# Patient Record
Sex: Male | Born: 1935 | Race: White | Hispanic: No | Marital: Married | Smoking: Former smoker
Health system: Southern US, Community
[De-identification: ages and names within clinical notes are randomized; demographics above are authoritative.]

## PROBLEM LIST (undated history)

## (undated) DIAGNOSIS — I10 Essential (primary) hypertension: Secondary | ICD-10-CM

## (undated) DIAGNOSIS — Z9889 Other specified postprocedural states: Secondary | ICD-10-CM

## (undated) DIAGNOSIS — R112 Nausea with vomiting, unspecified: Secondary | ICD-10-CM

## (undated) DIAGNOSIS — R413 Other amnesia: Secondary | ICD-10-CM

## (undated) DIAGNOSIS — E538 Deficiency of other specified B group vitamins: Secondary | ICD-10-CM

## (undated) DIAGNOSIS — G7000 Myasthenia gravis without (acute) exacerbation: Secondary | ICD-10-CM

## (undated) DIAGNOSIS — C4490 Unspecified malignant neoplasm of skin, unspecified: Secondary | ICD-10-CM

## (undated) DIAGNOSIS — I2699 Other pulmonary embolism without acute cor pulmonale: Secondary | ICD-10-CM

## (undated) DIAGNOSIS — J449 Chronic obstructive pulmonary disease, unspecified: Secondary | ICD-10-CM

## (undated) DIAGNOSIS — G250 Essential tremor: Secondary | ICD-10-CM

## (undated) HISTORY — DX: Essential (primary) hypertension: I10

## (undated) HISTORY — PX: SKIN CANCER EXCISION: SHX779

## (undated) HISTORY — DX: Unspecified malignant neoplasm of skin, unspecified: C44.90

## (undated) HISTORY — DX: Essential tremor: G25.0

## (undated) HISTORY — DX: Myasthenia gravis without (acute) exacerbation: G70.00

## (undated) HISTORY — DX: Other pulmonary embolism without acute cor pulmonale: I26.99

## (undated) HISTORY — PX: CATARACT EXTRACTION: SUR2

## (undated) HISTORY — DX: Deficiency of other specified B group vitamins: E53.8

## (undated) HISTORY — PX: COLONOSCOPY: SHX174

---

## 1964-05-17 HISTORY — PX: FOOT SURGERY: SHX648

## 2002-03-23 ENCOUNTER — Inpatient Hospital Stay (HOSPITAL_COMMUNITY): Admission: EM | Admit: 2002-03-23 | Discharge: 2002-04-04 | Payer: Self-pay | Admitting: Cardiology

## 2002-03-27 ENCOUNTER — Encounter: Payer: Self-pay | Admitting: Internal Medicine

## 2003-12-27 ENCOUNTER — Inpatient Hospital Stay (HOSPITAL_COMMUNITY): Admission: AD | Admit: 2003-12-27 | Discharge: 2004-01-07 | Payer: Self-pay | Admitting: Neurology

## 2004-01-27 ENCOUNTER — Inpatient Hospital Stay (HOSPITAL_COMMUNITY): Admission: AD | Admit: 2004-01-27 | Discharge: 2004-01-29 | Payer: Self-pay | Admitting: Neurology

## 2010-05-17 HISTORY — PX: CHOLECYSTECTOMY: SHX55

## 2011-05-31 DIAGNOSIS — J45901 Unspecified asthma with (acute) exacerbation: Secondary | ICD-10-CM | POA: Diagnosis not present

## 2011-05-31 DIAGNOSIS — R05 Cough: Secondary | ICD-10-CM | POA: Diagnosis not present

## 2011-06-14 DIAGNOSIS — Z85828 Personal history of other malignant neoplasm of skin: Secondary | ICD-10-CM | POA: Diagnosis not present

## 2011-06-14 DIAGNOSIS — C44320 Squamous cell carcinoma of skin of unspecified parts of face: Secondary | ICD-10-CM | POA: Diagnosis not present

## 2011-06-14 DIAGNOSIS — D485 Neoplasm of uncertain behavior of skin: Secondary | ICD-10-CM | POA: Diagnosis not present

## 2011-06-14 DIAGNOSIS — D0439 Carcinoma in situ of skin of other parts of face: Secondary | ICD-10-CM | POA: Diagnosis not present

## 2011-06-14 DIAGNOSIS — L570 Actinic keratosis: Secondary | ICD-10-CM | POA: Diagnosis not present

## 2011-06-14 DIAGNOSIS — R238 Other skin changes: Secondary | ICD-10-CM | POA: Diagnosis not present

## 2011-06-14 DIAGNOSIS — C44121 Squamous cell carcinoma of skin of unspecified eyelid, including canthus: Secondary | ICD-10-CM | POA: Diagnosis not present

## 2011-07-27 DIAGNOSIS — D0439 Carcinoma in situ of skin of other parts of face: Secondary | ICD-10-CM | POA: Diagnosis not present

## 2011-07-27 DIAGNOSIS — C44320 Squamous cell carcinoma of skin of unspecified parts of face: Secondary | ICD-10-CM | POA: Diagnosis not present

## 2011-07-27 DIAGNOSIS — C44121 Squamous cell carcinoma of skin of unspecified eyelid, including canthus: Secondary | ICD-10-CM | POA: Diagnosis not present

## 2011-08-04 DIAGNOSIS — K219 Gastro-esophageal reflux disease without esophagitis: Secondary | ICD-10-CM | POA: Diagnosis not present

## 2011-08-04 DIAGNOSIS — I10 Essential (primary) hypertension: Secondary | ICD-10-CM | POA: Diagnosis not present

## 2011-09-11 DIAGNOSIS — T1510XA Foreign body in conjunctival sac, unspecified eye, initial encounter: Secondary | ICD-10-CM | POA: Diagnosis not present

## 2011-09-11 DIAGNOSIS — H2510 Age-related nuclear cataract, unspecified eye: Secondary | ICD-10-CM | POA: Diagnosis not present

## 2011-09-11 DIAGNOSIS — S058X9A Other injuries of unspecified eye and orbit, initial encounter: Secondary | ICD-10-CM | POA: Diagnosis not present

## 2011-09-29 DIAGNOSIS — G7001 Myasthenia gravis with (acute) exacerbation: Secondary | ICD-10-CM | POA: Diagnosis not present

## 2011-09-29 DIAGNOSIS — G5600 Carpal tunnel syndrome, unspecified upper limb: Secondary | ICD-10-CM | POA: Diagnosis not present

## 2012-01-05 DIAGNOSIS — L821 Other seborrheic keratosis: Secondary | ICD-10-CM | POA: Diagnosis not present

## 2012-01-05 DIAGNOSIS — L570 Actinic keratosis: Secondary | ICD-10-CM | POA: Diagnosis not present

## 2012-01-05 DIAGNOSIS — Z85828 Personal history of other malignant neoplasm of skin: Secondary | ICD-10-CM | POA: Diagnosis not present

## 2012-01-20 DIAGNOSIS — K219 Gastro-esophageal reflux disease without esophagitis: Secondary | ICD-10-CM | POA: Diagnosis not present

## 2012-01-20 DIAGNOSIS — R5381 Other malaise: Secondary | ICD-10-CM | POA: Diagnosis not present

## 2012-01-20 DIAGNOSIS — I10 Essential (primary) hypertension: Secondary | ICD-10-CM | POA: Diagnosis not present

## 2012-01-20 DIAGNOSIS — E538 Deficiency of other specified B group vitamins: Secondary | ICD-10-CM | POA: Diagnosis not present

## 2012-01-27 DIAGNOSIS — I10 Essential (primary) hypertension: Secondary | ICD-10-CM | POA: Diagnosis not present

## 2012-01-27 DIAGNOSIS — K219 Gastro-esophageal reflux disease without esophagitis: Secondary | ICD-10-CM | POA: Diagnosis not present

## 2012-01-27 DIAGNOSIS — J438 Other emphysema: Secondary | ICD-10-CM | POA: Diagnosis not present

## 2012-01-27 DIAGNOSIS — G7000 Myasthenia gravis without (acute) exacerbation: Secondary | ICD-10-CM | POA: Diagnosis not present

## 2012-01-27 DIAGNOSIS — Z86711 Personal history of pulmonary embolism: Secondary | ICD-10-CM | POA: Diagnosis not present

## 2012-03-09 DIAGNOSIS — E538 Deficiency of other specified B group vitamins: Secondary | ICD-10-CM | POA: Diagnosis not present

## 2012-03-09 DIAGNOSIS — Z23 Encounter for immunization: Secondary | ICD-10-CM | POA: Diagnosis not present

## 2012-03-16 DIAGNOSIS — G7001 Myasthenia gravis with (acute) exacerbation: Secondary | ICD-10-CM | POA: Diagnosis not present

## 2012-07-07 ENCOUNTER — Encounter: Payer: Self-pay | Admitting: Neurology

## 2012-07-07 DIAGNOSIS — D518 Other vitamin B12 deficiency anemias: Secondary | ICD-10-CM

## 2012-07-07 DIAGNOSIS — G459 Transient cerebral ischemic attack, unspecified: Secondary | ICD-10-CM

## 2012-07-07 DIAGNOSIS — G5600 Carpal tunnel syndrome, unspecified upper limb: Secondary | ICD-10-CM

## 2012-07-07 DIAGNOSIS — G7001 Myasthenia gravis with (acute) exacerbation: Secondary | ICD-10-CM | POA: Insufficient documentation

## 2012-07-07 DIAGNOSIS — H538 Other visual disturbances: Secondary | ICD-10-CM

## 2012-07-07 DIAGNOSIS — G453 Amaurosis fugax: Secondary | ICD-10-CM

## 2012-07-07 DIAGNOSIS — G250 Essential tremor: Secondary | ICD-10-CM

## 2012-07-07 DIAGNOSIS — Z85828 Personal history of other malignant neoplasm of skin: Secondary | ICD-10-CM

## 2012-07-07 DIAGNOSIS — Z8679 Personal history of other diseases of the circulatory system: Secondary | ICD-10-CM

## 2012-07-07 DIAGNOSIS — G5602 Carpal tunnel syndrome, left upper limb: Secondary | ICD-10-CM | POA: Insufficient documentation

## 2012-07-07 DIAGNOSIS — G7000 Myasthenia gravis without (acute) exacerbation: Secondary | ICD-10-CM

## 2012-07-10 DIAGNOSIS — C44319 Basal cell carcinoma of skin of other parts of face: Secondary | ICD-10-CM | POA: Diagnosis not present

## 2012-07-10 DIAGNOSIS — L570 Actinic keratosis: Secondary | ICD-10-CM | POA: Diagnosis not present

## 2012-07-10 DIAGNOSIS — Z85828 Personal history of other malignant neoplasm of skin: Secondary | ICD-10-CM | POA: Diagnosis not present

## 2012-08-21 DIAGNOSIS — G5600 Carpal tunnel syndrome, unspecified upper limb: Secondary | ICD-10-CM | POA: Diagnosis not present

## 2012-08-25 DIAGNOSIS — M79609 Pain in unspecified limb: Secondary | ICD-10-CM | POA: Diagnosis not present

## 2012-08-25 DIAGNOSIS — I82409 Acute embolism and thrombosis of unspecified deep veins of unspecified lower extremity: Secondary | ICD-10-CM | POA: Diagnosis not present

## 2012-08-28 ENCOUNTER — Encounter: Payer: Self-pay | Admitting: Neurology

## 2012-08-28 ENCOUNTER — Ambulatory Visit (INDEPENDENT_AMBULATORY_CARE_PROVIDER_SITE_OTHER): Payer: Medicare Other | Admitting: Neurology

## 2012-08-28 VITALS — BP 133/74 | HR 61 | Ht 69.0 in | Wt 222.0 lb

## 2012-08-28 DIAGNOSIS — H5460 Unqualified visual loss, one eye, unspecified: Secondary | ICD-10-CM | POA: Diagnosis not present

## 2012-08-28 DIAGNOSIS — E538 Deficiency of other specified B group vitamins: Secondary | ICD-10-CM

## 2012-08-28 DIAGNOSIS — I2699 Other pulmonary embolism without acute cor pulmonale: Secondary | ICD-10-CM | POA: Diagnosis not present

## 2012-08-28 DIAGNOSIS — G7000 Myasthenia gravis without (acute) exacerbation: Secondary | ICD-10-CM

## 2012-08-28 DIAGNOSIS — I10 Essential (primary) hypertension: Secondary | ICD-10-CM

## 2012-08-28 DIAGNOSIS — C4490 Unspecified malignant neoplasm of skin, unspecified: Secondary | ICD-10-CM

## 2012-08-28 DIAGNOSIS — H5461 Unqualified visual loss, right eye, normal vision left eye: Secondary | ICD-10-CM

## 2012-08-28 DIAGNOSIS — G250 Essential tremor: Secondary | ICD-10-CM

## 2012-08-28 MED ORDER — PYRIDOSTIGMINE BROMIDE 60 MG PO TABS: 60.0000 mg | ORAL_TABLET | Freq: Four times a day (QID) | ORAL | Status: DC

## 2012-08-28 NOTE — Progress Notes (Signed)
HPI: 79 year old right-handed white married male with generalized seropositive myasthenia gravis characterized by bulbar symptomatology, shortness of breath , and dysphagia.   He was diagnosed 10/2002 with a  positive Tensilon test, positive acetylchoine receptor antibody,and had a positive ANA at 1-160 speckled pattern. Initially he had right eye ptosis and subsequently, double vision, fatigue with chewing, dysphagia, and upper and lower extremity weakness. He was initially treated with Mestinon bromide in June 2004 which was associated with GI side effects.  Prednisone was begun 03/29/2003 and CellCept 05/2003. He developed increasing jitteriness, rash, and cramps on CellCept which was discontinued 10/2003. He had progressive bulbar weakness and was admitted 12/2003 for IV IgG therapy which required a PEG tube for dysphagia transiently. He was started on Imuran. 01/17/2004 he was readmitted for 2 days of IV IgG because of shortness of breath. He has not been hospitalized since 01/2004.   He has been slowly tapered off prednisone 09/2007 and was on Imuran and Mestinon bromide. He had B12 deficiency and was receiving B12 shots ,but  B12 levels off injections have been normal and B12 shots were discontinued.  He has  a history of vague visual loss on the left of unknown etiology. He denies swallowing problems, speech problems, voice changes, focal weakness, or double vision.  He is independent in activities of daily living and takes one nap per day.He denies memory loss but his wife states that he does have memory loss. His last blood studies in my office 05/27/10 were normal except a low white blood cell count of 3900.  Dr. Herma Mering was concerned that the Imuran is associated with increasing risk of skin cancer and the imuran was discontinued 12/30/2010.   He has noted no change In myasthenic symptoms. His skin has improved. His medication for myasthenia is mesinon bromide 60  milligrams one and one half  tablets 4 times a day. He has shortness of breath since his pulmonary emboli in 2003, also complains of excessive fatigue.   He is allergic to aspirin, GI side effect, gastric ulcer,  and is on Plavix for a history of TIAs with right arm weakness He has  swallowing problems at the beginning of the meal with liquids that improves as the meal continues.     UPDATE April   He is going to have right CTS release in May 2014 by Dr. Teressa Senter.  Complains of shortness of breath with walking, bending over to tire his shoed, due to PE 2003, pulmnolgist folllowed up, he does not like to chew on tough beef, he denies swallowing difficulty, no double vision, no droopy eyelid, no significant bleeding muscle weakness. He is active in yard, shop, threadmill   Review of Systems  Out of a complete 14 system review, the patient complains of only the following symptoms, and all other reviewed systems are negative.   Constitutional:   N/A Cardiovascular:  Swelling in legs Ear/Nose/Throat:  N/A Skin: N/A Eyes: N/A Respiratory: Shortness of breath stroitestinal: N/A    Hematology/Lymphatic:  N/A Endocrine:  N/A Musculoskeletal:N/A Allergy/Immunology: N/A Neurological:  memory loss , confusion  Psychiatric:    N/A    Physical Exam  General: Well developed white male.    Neurologic Exam  Mental Status: Alert and oriented to time, place, and person.  Follows one, two and three step commands.  No aphasia, apraxia or agnosia Cranial Nerves: Visual fields are full to count fingers examination.  Discs flat.  Extraocular movements full.Corneals are present.  Hearing decreased, worse in  the right ear. No facial asymmetry.  Tongue midline, uvula midline, and gags present.  Sternocleidomastoid and trapezius testing normal. No significant pulses Motor: 5/5 strength proximally and distally in the upper and lower extremities.  No evidence of proximal, pronator, or distal drift.  No focal atrophy and no fasciculations  seen. Sensory: Intact to pinprick, light touch, joint position, and vibration testing.  Coordination:  mild outstretched hand and arm tremor.  Intact finger-to-nose, heel-to-shin, and rapid alternating movements.   Gait and Station: Can toe walk, heel walk, and tandem gait.  Romberg testing is negative. Reflexes: Postural and righting reflexes are normal.  DTRs 2+ and equal.  Plantar responses downgoing.    assessment and plan  Systemic seropositive myasthenia gravis, doing very well, immunosuppressive treatment now, continued cutting back his Mestinon, 60 mg 3 times a day, then half tablets 3 times a day,  return to clinic in 2 months, no Mestinon on the day of followup appointment

## 2012-08-28 NOTE — Patient Instructions (Signed)
Take mestinon 60mg  four times a day x 2 weeks,  Then mestinon 60mg  3 times  A day.  Came back to clinic in 3 months, the day of appointment, do not take any mestinon.

## 2012-09-05 ENCOUNTER — Other Ambulatory Visit: Payer: Self-pay | Admitting: Orthopedic Surgery

## 2012-09-11 ENCOUNTER — Encounter (HOSPITAL_BASED_OUTPATIENT_CLINIC_OR_DEPARTMENT_OTHER): Payer: Self-pay | Admitting: *Deleted

## 2012-09-11 ENCOUNTER — Other Ambulatory Visit: Payer: Self-pay

## 2012-09-11 ENCOUNTER — Encounter (HOSPITAL_BASED_OUTPATIENT_CLINIC_OR_DEPARTMENT_OTHER)
Admission: RE | Admit: 2012-09-11 | Discharge: 2012-09-11 | Disposition: A | Discharge status: Home / Self Care | Payer: Medicare Other | Source: Ambulatory Visit | Attending: Orthopedic Surgery | Admitting: Orthopedic Surgery

## 2012-09-11 DIAGNOSIS — G7000 Myasthenia gravis without (acute) exacerbation: Secondary | ICD-10-CM | POA: Diagnosis not present

## 2012-09-11 DIAGNOSIS — Z86711 Personal history of pulmonary embolism: Secondary | ICD-10-CM | POA: Diagnosis not present

## 2012-09-11 DIAGNOSIS — I10 Essential (primary) hypertension: Secondary | ICD-10-CM | POA: Diagnosis not present

## 2012-09-11 DIAGNOSIS — G5600 Carpal tunnel syndrome, unspecified upper limb: Secondary | ICD-10-CM | POA: Diagnosis not present

## 2012-09-11 LAB — BASIC METABOLIC PANEL
CO2: 29 mEq/L (ref 19–32)
Calcium: 9.5 mg/dL (ref 8.4–10.5)
Chloride: 100 mEq/L (ref 96–112)
Creatinine, Ser: 1.52 mg/dL — ABNORMAL HIGH (ref 0.50–1.35)
GFR calc non Af Amer: 42 mL/min — ABNORMAL LOW (ref >90)
Sodium: 138 mEq/L (ref 135–145)

## 2012-09-11 NOTE — Progress Notes (Signed)
Talked with wife-she will bring him for bmet-ekg-will have other CT done later- Was not told to stop plavix.

## 2012-09-13 NOTE — H&P (Signed)
Calvin Sloan is an 79 y.o. male.   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  His work involved Psychologist, occupational.  He has had a 12 month history of bilateral hand and wrist pain.  He has had large osteophytes in the dorsal aspect of his wrist, i.e. carpal bosses.  He has had symptoms at night that cause him to wake with marked numbness in the median innervated fingers on the right.  He has no history of diabetes, thyroid disease or gout.  He has two sisters who have had  carpal tunnel syndrome.  He has had no history of wrist fracture.     Past Medical History  Diagnosis Date  . Hypertension   . Pulmonary emboli     in Nov 2003  . B12 deficiency   . Essential tremor   . Skin cancer   . Myasthenia gravis     since 2004  . Memory deficit     Past Surgical History  Procedure Laterality Date  . Foot surgery  1966    lt orif foot  . Skin cancer excision    . Cholecystectomy  2012    [facility]  . Colonoscopy      Family History  Problem Relation Age of Onset  . Heart disease Mother   . Heart disease Father    Social History:  reports that he quit smoking about 44 years ago. He does not have any smokeless tobacco history on file. He reports that he does not drink alcohol or use illicit drugs.  Allergies:  Allergies  Allergen Reactions  . Aspirin   . Shellfish Allergy     No prescriptions prior to admission    No results found for this or any previous visit (from the past 48 hour(s)).  No results found.   Pertinent items are noted in HPI.  Height 5\' 9"  (1.753 m), weight 99.791 kg (220 lb).  General appearance: alert Head: Normocephalic, without obvious abnormality Neck: supple, symmetrical, trachea midline Resp: clear to auscultation bilaterally Cardio: regular rate and rhythm GI: normal findings: bowel sounds normal Extremities:   Inspection of his hands and wrist reveal large dorsal carpal bosses.  He has near full range of motion of his wrists and fingers.  No sign of stenosing tenosynovitis.  He has positive wrist flexion test bilaterally.  He has positive Tinel's sign bilaterally.  I do not detect thenar atrophy.  His intrinsic muscle strength is preserved in the interosseous muscles no acute distress hypothenar muscles.    X-rays of his hands and wrist demonstrate bilateral STT arthrosis, right worse than left.    Dr. Johna Roles completed detailed electrodiagnostic studies.  These reveal very severe bilateral carpal tunnel syndrome right worse than left.   His sensory motor amplitudes are very diminished.    Pulses: 2+ and symmetric Skin: Skin color, texture, turgor normal. No rashes or lesions or normal Neurologic:Normal gait. No lid lag. Good overall strength.    Assessment/Plan Impression: Right CTS  Plan:To the OR for right CTR.The procedure, risks,benefits and post-op course were discussed with the patient at length and they were in agreement with the plan.  DASNOIT,Jullian Clayson J 09/13/2012, 3:39 PM  H&P documentation: 09/14/2012  -History and Physical Reviewed  -Patient has been re-examined  -No change in the plan of care  Wyn Forster, MD

## 2012-09-14 ENCOUNTER — Encounter (HOSPITAL_BASED_OUTPATIENT_CLINIC_OR_DEPARTMENT_OTHER)
Admission: RE | Disposition: A | Discharge status: Home / Self Care | Payer: Self-pay | Source: Ambulatory Visit | Attending: Orthopedic Surgery

## 2012-09-14 ENCOUNTER — Ambulatory Visit (HOSPITAL_BASED_OUTPATIENT_CLINIC_OR_DEPARTMENT_OTHER)
Admission: RE | Admit: 2012-09-14 | Discharge: 2012-09-14 | Disposition: A | Discharge status: Home / Self Care | Payer: Medicare Other | Source: Ambulatory Visit | Attending: Orthopedic Surgery | Admitting: Orthopedic Surgery

## 2012-09-14 ENCOUNTER — Ambulatory Visit (HOSPITAL_BASED_OUTPATIENT_CLINIC_OR_DEPARTMENT_OTHER): Payer: Medicare Other | Admitting: Anesthesiology

## 2012-09-14 ENCOUNTER — Encounter (HOSPITAL_BASED_OUTPATIENT_CLINIC_OR_DEPARTMENT_OTHER): Payer: Self-pay | Admitting: *Deleted

## 2012-09-14 ENCOUNTER — Encounter (HOSPITAL_BASED_OUTPATIENT_CLINIC_OR_DEPARTMENT_OTHER): Payer: Self-pay | Admitting: Anesthesiology

## 2012-09-14 DIAGNOSIS — Z86711 Personal history of pulmonary embolism: Secondary | ICD-10-CM | POA: Insufficient documentation

## 2012-09-14 DIAGNOSIS — I10 Essential (primary) hypertension: Secondary | ICD-10-CM | POA: Insufficient documentation

## 2012-09-14 DIAGNOSIS — G7000 Myasthenia gravis without (acute) exacerbation: Secondary | ICD-10-CM | POA: Diagnosis not present

## 2012-09-14 DIAGNOSIS — G5600 Carpal tunnel syndrome, unspecified upper limb: Secondary | ICD-10-CM | POA: Insufficient documentation

## 2012-09-14 HISTORY — DX: Other amnesia: R41.3

## 2012-09-14 HISTORY — PX: CARPAL TUNNEL RELEASE: SHX101

## 2012-09-14 SURGERY — CARPAL TUNNEL RELEASE
Anesthesia: Monitor Anesthesia Care | Site: Wrist | Laterality: Right | Wound class: Clean

## 2012-09-14 MED ORDER — CHLORHEXIDINE GLUCONATE 4 % EX LIQD: 60.0000 mL | Freq: Once | CUTANEOUS | Status: DC

## 2012-09-14 MED ORDER — LACTATED RINGERS IV SOLN
INTRAVENOUS | Status: DC
  Administered 2012-09-14: 09:00:00 via INTRAVENOUS

## 2012-09-14 MED ORDER — PROPOFOL 10 MG/ML IV EMUL
INTRAVENOUS | Status: DC | PRN
  Administered 2012-09-14: 100 ug/kg/min via INTRAVENOUS

## 2012-09-14 MED ORDER — OXYCODONE-ACETAMINOPHEN 5-325 MG PO TABS: ORAL_TABLET | ORAL | Status: DC

## 2012-09-14 MED ORDER — LIDOCAINE HCL (CARDIAC) 20 MG/ML IV SOLN
INTRAVENOUS | Status: DC | PRN
  Administered 2012-09-14: 50 mg via INTRAVENOUS

## 2012-09-14 MED ORDER — FENTANYL CITRATE 0.05 MG/ML IJ SOLN
INTRAMUSCULAR | Status: DC | PRN
  Administered 2012-09-14: 50 ug via INTRAVENOUS
  Administered 2012-09-14 (×2): 25 ug via INTRAVENOUS

## 2012-09-14 MED ORDER — MIDAZOLAM HCL 2 MG/2ML IJ SOLN: 1.0000 mg | INTRAMUSCULAR | Status: DC | PRN

## 2012-09-14 MED ORDER — FENTANYL CITRATE 0.05 MG/ML IJ SOLN: 50.0000 ug | INTRAMUSCULAR | Status: DC | PRN

## 2012-09-14 MED ORDER — LIDOCAINE HCL 2 % IJ SOLN
INTRAMUSCULAR | Status: DC | PRN
  Administered 2012-09-14: 4.5 mL

## 2012-09-14 SURGICAL SUPPLY — 37 items
BANDAGE ADHESIVE 1X3 (GAUZE/BANDAGES/DRESSINGS) IMPLANT
BANDAGE ELASTIC 3 VELCRO ST LF (GAUZE/BANDAGES/DRESSINGS) ×2 IMPLANT
BLADE SURG 15 STRL LF DISP TIS (BLADE) ×1 IMPLANT
BLADE SURG 15 STRL SS (BLADE) ×1
BNDG ESMARK 4X9 LF (GAUZE/BANDAGES/DRESSINGS) ×2 IMPLANT
BRUSH SCRUB EZ PLAIN DRY (MISCELLANEOUS) ×2 IMPLANT
CLOTH BEACON ORANGE TIMEOUT ST (SAFETY) ×2 IMPLANT
CORDS BIPOLAR (ELECTRODE) ×2 IMPLANT
COVER MAYO STAND STRL (DRAPES) ×2 IMPLANT
COVER TABLE BACK 60X90 (DRAPES) ×2 IMPLANT
CUFF TOURNIQUET SINGLE 18IN (TOURNIQUET CUFF) ×2 IMPLANT
DECANTER SPIKE VIAL GLASS SM (MISCELLANEOUS) IMPLANT
DRAPE EXTREMITY T 121X128X90 (DRAPE) ×2 IMPLANT
DRAPE SURG 17X23 STRL (DRAPES) ×2 IMPLANT
GLOVE BIOGEL M STRL SZ7.5 (GLOVE) ×2 IMPLANT
GLOVE BIOGEL PI IND STRL 6.5 (GLOVE) ×1 IMPLANT
GLOVE BIOGEL PI INDICATOR 6.5 (GLOVE) ×1
GLOVE ECLIPSE 6.5 STRL STRAW (GLOVE) ×2 IMPLANT
GLOVE ORTHO TXT STRL SZ7.5 (GLOVE) ×2 IMPLANT
GOWN BRE IMP PREV XXLGXLNG (GOWN DISPOSABLE) ×2 IMPLANT
GOWN PREVENTION PLUS XLARGE (GOWN DISPOSABLE) ×4 IMPLANT
NEEDLE 27GAX1X1/2 (NEEDLE) ×2 IMPLANT
PACK BASIN DAY SURGERY FS (CUSTOM PROCEDURE TRAY) ×2 IMPLANT
PAD CAST 3X4 CTTN HI CHSV (CAST SUPPLIES) ×1 IMPLANT
PADDING CAST ABS 4INX4YD NS (CAST SUPPLIES) ×1
PADDING CAST ABS COTTON 4X4 ST (CAST SUPPLIES) ×1 IMPLANT
PADDING CAST COTTON 3X4 STRL (CAST SUPPLIES) ×1
SPLINT PLASTER CAST XFAST 3X15 (CAST SUPPLIES) ×5 IMPLANT
SPLINT PLASTER XTRA FASTSET 3X (CAST SUPPLIES) ×5
SPONGE GAUZE 4X4 12PLY (GAUZE/BANDAGES/DRESSINGS) ×2 IMPLANT
STOCKINETTE 4X48 STRL (DRAPES) ×2 IMPLANT
STRIP CLOSURE SKIN 1/2X4 (GAUZE/BANDAGES/DRESSINGS) ×2 IMPLANT
SUT PROLENE 3 0 PS 2 (SUTURE) ×2 IMPLANT
SYR 3ML 23GX1 SAFETY (SYRINGE) IMPLANT
SYR CONTROL 10ML LL (SYRINGE) ×2 IMPLANT
TRAY DSU PREP LF (CUSTOM PROCEDURE TRAY) ×2 IMPLANT
UNDERPAD 30X30 INCONTINENT (UNDERPADS AND DIAPERS) ×2 IMPLANT

## 2012-09-14 NOTE — Brief Op Note (Signed)
09/14/2012  10:12 AM  PATIENT:  Mickie Hillier Glendinning  79 y.o. male  PRE-OPERATIVE DIAGNOSIS:  SEVERE BILATERAL CARPAL TUNNEL SYNDROME  POST-OPERATIVE DIAGNOSIS:  SEVERE BILATERAL CARPAL TUNNEL SYNDROME  PROCEDURE:  Procedure(s): CARPAL TUNNEL RELEASE (Right)  SURGEON:  Surgeon(s) and Role:    * Wyn Forster., MD - Primary  PHYSICIAN ASSISTANT:   ASSISTANTS: nurse  ANESTHESIA:   Monitored anesthesia care and 2% lidocaine field block and median nerve block   EBL:  Total I/O In: 200 [I.V.:200] Out: -   BLOOD ADMINISTERED:none  DRAINS: none   LOCAL MEDICATIONS USED:  XYLOCAINE   SPECIMEN:  No Specimen  DISPOSITION OF SPECIMEN:  N/A  COUNTS:  YES  TOURNIQUET:   Total Tourniquet Time Documented: Upper Arm (Right) - 21 minutes Total: Upper Arm (Right) - 21 minutes   DICTATION: .Other Dictation: Dictation Number [id number]  PLAN OF CARE: Discharge to home after PACU  PATIENT DISPOSITION:  PACU - hemodynamically stable.   Delay start of Pharmacological VTE agent (>24hrs) due to surgical blood loss or risk of bleeding: not applicable

## 2012-09-14 NOTE — Anesthesia Postprocedure Evaluation (Signed)
Anesthesia Post Note  Patient: Calvin Sloan  Procedure(s) Performed: Procedure(s) (LRB): CARPAL TUNNEL RELEASE (Right)  Anesthesia type: general  Patient location: PACU  Post pain: Pain level controlled  Post assessment: Patient's Cardiovascular Status Stable  Last Vitals:  Filed Vitals:   09/14/12 1115  BP: 127/68  Pulse: 55  Temp:   Resp: 11    Post vital signs: Reviewed and stable  Level of consciousness: sedated  Complications: No apparent anesthesia complications

## 2012-09-14 NOTE — Op Note (Signed)
[id number] 

## 2012-09-14 NOTE — Anesthesia Preprocedure Evaluation (Signed)
Anesthesia Evaluation  Patient identified by MRN, date of birth, ID band Patient awake    Reviewed: Allergy & Precautions, H&P , NPO status , Patient's Chart, lab work & pertinent test results  Airway Mallampati: I TM Distance: >3 FB Neck ROM: Full    Dental   Pulmonary          Cardiovascular hypertension, Pt. on medications     Neuro/Psych Pt has Myesthenia gravis on mestinon 3 times daily with symptoms of tiredness at end of dosage period.  Neuromuscular disease    GI/Hepatic   Endo/Other    Renal/GU      Musculoskeletal   Abdominal   Peds  Hematology   Anesthesia Other Findings   Reproductive/Obstetrics                           Anesthesia Physical Anesthesia Plan  ASA: III  Anesthesia Plan: MAC   Post-op Pain Management:    Induction: Intravenous  Airway Management Planned: Simple Face Mask  Additional Equipment:   Intra-op Plan:   Post-operative Plan:   Informed Consent: I have reviewed the patients History and Physical, chart, labs and discussed the procedure including the risks, benefits and alternatives for the proposed anesthesia with the patient or authorized representative who has indicated his/her understanding and acceptance.     Plan Discussed with: CRNA and Surgeon  Anesthesia Plan Comments:         Anesthesia Quick Evaluation

## 2012-09-14 NOTE — Transfer of Care (Signed)
Immediate Anesthesia Transfer of Care Note  Patient: Calvin Sloan  Procedure(s) Performed: Procedure(s) (LRB): CARPAL TUNNEL RELEASE (Right)  Patient Location: PACU  Anesthesia Type: MAC  Level of Consciousness: awake, alert , oriented and patient cooperative  Airway & Oxygen Therapy: Patient Spontanous Breathing and Patient connected to face mask oxygen  Post-op Assessment: Report given to PACU RN and Post -op Vital signs reviewed and stable  Post vital signs: Reviewed and stable  Complications: No apparent anesthesia complications

## 2012-09-15 ENCOUNTER — Encounter (HOSPITAL_BASED_OUTPATIENT_CLINIC_OR_DEPARTMENT_OTHER): Payer: Self-pay | Admitting: Orthopedic Surgery

## 2012-09-15 NOTE — Op Note (Signed)
NAMEELIN, [name]  [id number]  MEDICAL RECORD NO.:  [id number]  LOCATION:                                 FACILITY:  PHYSICIAN:  Calvin Fitch. Chassie Pennix, M.D. DATE OF BIRTH:  1933-07-23  DATE OF PROCEDURE:  09/14/2012 DATE OF DISCHARGE:                              OPERATIVE REPORT   PREOPERATIVE DIAGNOSES:  Entrapment neuropathy median nerve, right carpal tunnel severe with thenar atrophy.  POSTOPERATIVE DIAGNOSES:  Entrapment neuropathy median nerve, right carpal tunnel severe with thenar atrophy.  OPERATION:  Release of right transverse carpal ligament.  OPERATING SURGEON:  Calvin Fitch. Bushra Denman, M.D.  ASSISTANT:  Nurse.  ANESTHESIA:  A 2% lidocaine field block and median nerve block right wrist supplemented by IV sedation.  SUPERVISING ANESTHESIOLOGIST:  Kaylyn Layer. Michelle Piper, M.D.  INDICATIONS:  Calvin Sloan is a 79 year old gentleman referred by Dr. Selinda Flavin of [place], [place] for management of severe bilateral carpal tunnel syndrome.  He has a very complex past medical history, with background myasthenia gravis managed by Dr. Terrace Arabia of [facility].  In addition to his issues with myasthenia gravis, he has a history of acid reflux, high blood pressure controlled by medication and is on Plavix anticoagulation.  He presented for evaluation and management of his hands.  He had electrodiagnostic studies documenting very severe bilateral median neuropathy, and clinical examination confirmed thenar atrophy.  I had detailed informed consent with Calvin Sloan and his wife.  I advised that it is in his best interest to proceed with release of his transverse carpal ligaments in a staged manner.  He understands that it will take 5-6 months to have maximum recovery of his nerve.  Our goal with release of the transverse carpal ligament is relief of his night pain, possible improvement of his numbness, and possible improvement of his thenar  atrophy.  At age 33 with this severe neuropathy, we cannot guarantee full recovery.  He and his wife understand these limitations.  Questions were invited and answered in detail.  Preoperatively, we consulted with Dr. Michelle Piper of Anesthesia.  Due to his myasthenia gravis, we felt it was in our best interest to provide local anesthesia and sedation.  Calvin Sloan was counseled by Dr. Michelle Piper in the preoperative holding area and agreed to proceed with IV sedation and local anesthesia for his surgical procedure.  After informed consent, he is brought to room 1 of the [facility] at this time.  Preoperatively, his right hand and wrist were marked for the proper surgical site identification protocol.  Questions were invited and answered in detail.  PROCEDURE:  Calvin Sloan was brought to room 1 of the [facility] and placed in supine position on the operating table.  Following IV sedation under Dr. Deirdre Priest direct supervision, the right arm and hand were prepped with Betadine soap and solution, sterilely draped.  A pneumatic tourniquet was applied to the proximal right brachium.  Following routine surgical time-out, the right hand and arm were exsanguinated with Esmarch bandage and an arterial tourniquet on the proximal brachium inflated to 250 mmHg due to mild systolic hypertension.  A 2% lidocaine was infiltrated in the path  of the intended incision.  The distal forearm volar skin and around the median nerve to obtain a median nerve block.  After 5 minutes, excellent anesthesia was achieved.  Procedure commenced with a short incision in the line of the ring finger in the palm.  Subcutaneous tissues were carefully divided along the palmar fascia.  This was markedly thickened.  The palmar fascia was released with scissors and scalpel down to the level of the mid palmar space.  The superficial palmar arch and the common sensory branch of the median nerve were identified.   The carpal canal was sounded with a Penfield 4 elevator, followed by sequential proximal release of the transverse carpal ligament and volar forearm fascia, fully decompressing the median nerve.  The nerve was displaced about 1 cm ulnar to its usual position due to hypertrophy of the ulnar bursa.  With great care, the volar forearm fascia was released 6 cm above the distal wrist flexion crease.  Bleeding points along the margins of the skin, fascia, and ligament were electrocauterized with bipolar current, followed by repair of the skin with intradermal 3-0 Prolene suture.  For aftercare, Calvin Sloan will be provided Percocet 5 mg tablets 1 p.o. q.4-6 hours p.r.n. pain, 20 tablets without refill.  He will return to see [place] in the office in 1 week for dressing change. We may leave the suture in for 10-14 days due to his multiple medical issues.     Calvin Sloan, M.D.     RVS/MEDQ  D:  09/14/2012  T:  09/15/2012  Job:  [id number]  cc:   Selinda Flavin, MD

## 2012-11-30 ENCOUNTER — Ambulatory Visit (INDEPENDENT_AMBULATORY_CARE_PROVIDER_SITE_OTHER): Payer: Medicare Other | Admitting: Neurology

## 2012-11-30 ENCOUNTER — Encounter: Payer: Self-pay | Admitting: Neurology

## 2012-11-30 VITALS — BP 133/78 | HR 66 | Ht 69.0 in | Wt 224.0 lb

## 2012-11-30 DIAGNOSIS — G7000 Myasthenia gravis without (acute) exacerbation: Secondary | ICD-10-CM

## 2012-11-30 DIAGNOSIS — G252 Other specified forms of tremor: Secondary | ICD-10-CM | POA: Diagnosis not present

## 2012-11-30 DIAGNOSIS — G250 Essential tremor: Secondary | ICD-10-CM | POA: Diagnosis not present

## 2012-11-30 NOTE — Progress Notes (Signed)
[facility]  PATIENT: Calvin Sloan DOB: [birth year]   HISTORY FROM: patient, wife, chart REASON FOR VISIT: routine follow up   HISTORICAL  CHIEF COMPLAINT:  Chief Complaint  Patient presents with  . Myasthenia Gravis    # 5  Revisit    HISTORY OF PRESENT ILLNESS: 79 year old right-handed white married male with generalized seropositive myasthenia gravis characterized by bulbar symptomatology, shortness of breath , and dysphagia.  He was diagnosed 10/2002 with a positive Tensilon test, positive acetylchoine receptor antibody,and had a positive ANA at 1-160 speckled pattern. Initially he had right eye ptosis and subsequently, double vision, fatigue with chewing, dysphagia, and upper and lower extremity weakness. He was initially treated with Mestinon bromide in June 2004 which was associated with GI side effects.  Prednisone was begun 03/29/2003 and CellCept 05/2003. He developed increasing jitteriness, rash, and cramps on CellCept which was discontinued 10/2003. He had progressive bulbar weakness and was admitted 12/2003 for IV IgG therapy which required a PEG tube for dysphagia transiently. He was started on Imuran. 01/17/2004 he was readmitted for 2 days of IV IgG because of shortness of breath. He has not been hospitalized since 01/2004.  He has been slowly tapered off prednisone 09/2007 and was on Imuran and Mestinon bromide. He had B12 deficiency and was receiving B12 shots ,but B12 levels off injections have been normal and B12 shots were discontinued.  He has a history of vague visual loss on the left of unknown etiology. He denies swallowing problems, speech problems, voice changes, focal weakness, or double vision.  He is independent in activities of daily living and takes one nap per day.He denies memory loss but his wife states that he does have memory loss. His last blood studies in my office 05/27/10 were normal except a low white blood cell count of 3900.  Dr.  Herma Mering was concerned that the Imuran is associated with increasing risk of skin cancer and the imuran was discontinued 12/30/2010.  He has noted no change In myasthenic symptoms. His skin has improved. His medication for myasthenia is mesinon bromide 60 milligrams one and one half tablets 4 times a day. He has shortness of breath since his pulmonary emboli in 2003, also complains of excessive fatigue.  He is allergic to aspirin, GI side effect, gastric ulcer, and is on Plavix for a history of TIAs with right arm weakness He has swallowing problems at the beginning of the meal with liquids that improves as the meal continues.   UPDATE April  He is going to have right CTS release in May 2014 by Dr. Teressa Senter.  Complains of shortness of breath with walking, bending over to tire his shoed, due to PE 2003, pulmnolgist folllowed up, he does not like to chew on tough beef, he denies swallowing difficulty, no double vision, no droopy eyelid, no significant  muscle weakness. He is active in yard, shop, treadmill   UPDATE November 30, 2012: Had right CTS in May with Dr. Teressa Senter with very good result.  States he is doing well, is fatigued at times and has mild trouble swallowing at times.  Denies double vision, no significant muscle weakness.  No falls or dizziness.  Stays active with appropriate rest.  REVIEW OF SYSTEMS: Full 14 system review of systems performed and notable only for:  Constitutional: N/A  Cardiovascular: N/A  Ear/Nose/Throat: hearing loss, ringing in ears, trouble swallowing at times Skin: N/A  Eyes: N/A  Respiratory: short of breath Gastroitestinal: N/A  Hematology/Lymphatic:  N/A  Endocrine: N/A Musculoskeletal:N/A  Allergy/Immunology: N/A  Neurological: N/A Psychiatric: N/A   ALLERGIES: Allergies  Allergen Reactions  . Aspirin   . Shellfish Allergy     HOME MEDICATIONS: Outpatient Prescriptions Prior to Visit  Medication Sig Dispense Refill  . clopidogrel (PLAVIX) 75 MG  tablet Take 75 mg by mouth daily.      . diphenhydrAMINE (BENADRYL) 25 MG tablet Take 25 mg by mouth every 6 (six) hours as needed for itching.      . pantoprazole (PROTONIX) 40 MG tablet Take 40 mg by mouth daily.      . valsartan-hydrochlorothiazide (DIOVAN-HCT) 320-25 MG per tablet Take 1 tablet by mouth daily.      . fluocinonide (LIDEX) 0.05 % external solution       . oxyCODONE-acetaminophen (PERCOCET/ROXICET) 5-325 MG per tablet Take one half, one or two tablets every 6 hours as needed for post operative pain in the hand  20 tablet  0  . pyridostigmine (MESTINON) 60 MG tablet Take 1 tablet (60 mg total) by mouth 4 (four) times daily.  120 tablet  6   No facility-administered medications prior to visit.    PAST MEDICAL HISTORY: Past Medical History  Diagnosis Date  . Hypertension   . Pulmonary emboli     in Nov 2003  . B12 deficiency   . Essential tremor   . Skin cancer   . Myasthenia gravis     since 2004  . Memory deficit     PAST SURGICAL HISTORY: Past Surgical History  Procedure Laterality Date  . Foot surgery  1966    lt orif foot  . Skin cancer excision    . Cholecystectomy  2012    [facility]  . Colonoscopy    . Carpal tunnel release Right 09/14/2012    Procedure: CARPAL TUNNEL RELEASE;  Surgeon: Wyn Forster., MD;  Location: [facility];  Service: Orthopedics;  Laterality: Right;    FAMILY HISTORY: Family History  Problem Relation Age of Onset  . Heart disease Mother   . Heart disease Father     SOCIAL HISTORY: History   Social History  . Marital Status: Married    Spouse Name: Kathie Rhodes    Number of Children: 2  . Years of Education: 12   Occupational History  . Plant     Retired   Social History Main Topics  . Smoking status: Former Smoker    Quit date: 05/17/1968  . Smokeless tobacco: Never Used  . Alcohol Use: No  . Drug Use: No  . Sexually Active: Not on file   Other Topics Concern  . Not on file   Social History  Narrative   He lives with his wife Kathie Rhodes(, has 2 children, retired. Hard of hearing.   Right handed.   Caffeine- None     PHYSICAL EXAM  Filed Vitals:   11/30/12 1120  BP: 133/78  Pulse: 66  Height: 5\' 9"  (1.753 m)  Weight: 224 lb (101.606 kg)   Body mass index is 33.06 kg/(m^2).  Generalized: In no acute distress, Well developed white male.    Neck: Supple, no carotid bruits   Cardiac: Regular rate rhythm, no murmur   Pulmonary: Clear to auscultation bilaterally   Musculoskeletal: No deformity    Mentation: Alert oriented to time, place, history taking, language fluent, and causual conversation  Neurologic Exam  Mental Status: Alert and oriented to time, place, and person. Follows one, two and three step commands. No  aphasia, apraxia or agnosia  Cranial Nerves: Visual fields are full to count fingers examination. Discs flat. Extraocular movements full.Corneals are present. Hearing decreased, worse in the right ear. No facial asymmetry. Tongue midline, uvula midline, and gags present. Sternocleidomastoid and trapezius testing normal.   Motor: 5/5 strength proximally and distally in the upper and lower extremities. No evidence of proximal, pronator, or distal drift. No focal atrophy and no fasciculations seen.  Sensory: Intact to pinprick, light touch, joint position, and vibration testing.  Coordination: mild outstretched hand and arm tremor. Intact finger-to-nose, heel-to-shin, and rapid alternating movements.  Gait and Station: Can toe walk, heel walk, and tandem gait. Romberg testing is negative.  Reflexes:  DTRs 2+ and equal. Plantar responses downgoing.   DIAGNOSTIC DATA (LABS, IMAGING, TESTING) - I reviewed patient records, labs, notes, testing and imaging myself where available.  Lab Results  Component Value Date   HGB 14.4 09/14/2012      Component Value Date/Time   NA 138 09/11/2012 1430   K 4.5 09/11/2012 1430   CL 100 09/11/2012 1430   CO2 29 09/11/2012 1430     GLUCOSE 177* 09/11/2012 1430   BUN 23 09/11/2012 1430   CREATININE 1.52* 09/11/2012 1430   CALCIUM 9.5 09/11/2012 1430   GFRNONAA 42* 09/11/2012 1430   GFRAA 49* 09/11/2012 1430    ASSESSMENT AND PLAN 79 year old right-handed white married male with systemic seropositive myasthenia gravis, diagnosed 2004, doing very well.  We will stop Mestinon now.  Call if any recurrence of symptoms, dropping eyelid or double vision. Return to clinic in 6 months  Medications Discontinued During This Encounter  Medication Reason  . pyridostigmine (MESTINON) 60 MG tablet Change in therapy    Yareliz Thorstenson NP-C 11/30/2012, 12:15 PM  [facility] [address] [place], [place] [address] [phone]

## 2012-11-30 NOTE — Patient Instructions (Signed)
Stop Mestinon.  Follow up visit in 6 months.  Call immediately if symptoms recur.

## 2012-11-30 NOTE — Progress Notes (Signed)
HPI:   Calvin Sloan is a 79 year old right-handed white married male with generalized seropositive myasthenia gravis characterized by bulbar weakness, shortness of breath , and dysphagia.   He was diagnosed in 10/2002 with a  positive Tensilon test, positive acetylchoine receptor antibody,and had a positive ANA at 1-160 speckled pattern. Initially he had right eye ptosis and subsequently, double vision, fatigue with chewing, dysphagia, and upper and lower extremity weakness. He was initially treated with Mestinon bromide in June 2004,  which was associated with GI side effects.  Prednisone was started in 03/29/2003 and CellCept 05/2003. He developed increased jitteriness, rash, and cramps on CellCept which was discontinued 10/2003. He had progressive bulbar weakness and was admitted 12/2003 for IV IgG therapy which required a PEG tube for dysphagia transiently. He was started on Imuran. 01/17/2004 he was readmitted for 2 days of IV IgG because of shortness of breath. He has not been hospitalized since 01/2004.   He has been slowly tapered off prednisone 09/2007 and was on Imuran and Mestinon bromide.   He had B12 deficiency and was receiving B12 shots ,but  B12 levels off injections have been normal and B12 shots were discontinued.  He has  a history of vague visual loss on the left of unknown etiology. He denies swallowing problems, speech problems, voice changes, focal weakness, or double vision.  He is independent in activities of daily living and takes one nap per day.He denies memory loss but his wife states that he does have memory loss. His last blood studies in my office 05/27/10 were normal except a low white blood cell count of 3900.  Dr. Herma Mering was concerned that the Imuran is associated with increasing risk of skin cancer and the imuran was discontinued 12/30/2010.   He has noted no change In myasthenic symptoms. His skin has improved. His medication for myasthenia is mesinon bromide 60   milligrams one and one half tablets 4 times a day. He has shortness of breath since his pulmonary emboli in 2003, also complains of excessive fatigue.   He is allergic to aspirin, GI side effect, gastric ulcer,  and is on Plavix for a history of TIAs with right arm weakness He has  swallowing problems at the beginning of the meal with liquids that improves as the meal continues.     UPDATE April   He is going to have right CTS release in May 2014 by Dr. Teressa Senter.  Complains of shortness of breath with walking, bending over to tire his shoed, due to PE 2003, pulmnolgist folllowed up, he does not like to chew on tough beef, he denies swallowing difficulty, no double vision, no droopy eyelid, no significant bleeding muscle weakness. He is active in yard, shop, threadmill   Review of Systems  Out of a complete 14 system review, the patient complains of only the following symptoms, and all other reviewed systems are negative.   Constitutional:   N/A Cardiovascular:  Swelling in legs Ear/Nose/Throat:  N/A Skin: N/A Eyes: N/A Respiratory: Shortness of breath stroitestinal: N/A    Hematology/Lymphatic:  N/A Endocrine:  N/A Musculoskeletal:N/A Allergy/Immunology: N/A Neurological:  memory loss , confusion  Psychiatric:    N/A    Physical Exam  General: Well developed white male.    Neurologic Exam  Mental Status: Alert and oriented to time, place, and person.  Follows one, two and three step commands.  No aphasia, apraxia or agnosia Cranial Nerves: Visual fields are full to count fingers examination.  Discs flat.  Extraocular movements full.Corneals are present.  Hearing decreased, worse in the right ear. No facial asymmetry.  Tongue midline, uvula midline, and gags present.  Sternocleidomastoid and trapezius testing normal. No significant pulses Motor: 5/5 strength proximally and distally in the upper and lower extremities.  No evidence of proximal, pronator, or distal drift.  No focal  atrophy and no fasciculations seen. Sensory: Intact to pinprick, light touch, joint position, and vibration testing.  Coordination:  mild outstretched hand and arm tremor.  Intact finger-to-nose, heel-to-shin, and rapid alternating movements.   Gait and Station: Can toe walk, heel walk, and tandem gait.  Romberg testing is negative. Reflexes: Postural and righting reflexes are normal.  DTRs 2+ and equal.  Plantar responses downgoing.   Assessment and plan  Systemic seropositive myasthenia gravis, doing very well, off immunosuppressive treatment since 2012, no recurrent symptoms,stop Mestinon, 60 mg 3 times a day, then half tablets 3 times a day,  return to clinic in 2 months, no Mestinon on the day of followup appointment

## 2012-12-18 DIAGNOSIS — H9190 Unspecified hearing loss, unspecified ear: Secondary | ICD-10-CM | POA: Diagnosis not present

## 2012-12-26 DIAGNOSIS — H903 Sensorineural hearing loss, bilateral: Secondary | ICD-10-CM | POA: Diagnosis not present

## 2013-01-01 DIAGNOSIS — C44519 Basal cell carcinoma of skin of other part of trunk: Secondary | ICD-10-CM | POA: Diagnosis not present

## 2013-01-01 DIAGNOSIS — L570 Actinic keratosis: Secondary | ICD-10-CM | POA: Diagnosis not present

## 2013-01-01 DIAGNOSIS — Z85828 Personal history of other malignant neoplasm of skin: Secondary | ICD-10-CM | POA: Diagnosis not present

## 2013-01-01 DIAGNOSIS — C4441 Basal cell carcinoma of skin of scalp and neck: Secondary | ICD-10-CM | POA: Diagnosis not present

## 2013-01-01 DIAGNOSIS — D485 Neoplasm of uncertain behavior of skin: Secondary | ICD-10-CM | POA: Diagnosis not present

## 2013-01-25 DIAGNOSIS — H9190 Unspecified hearing loss, unspecified ear: Secondary | ICD-10-CM | POA: Diagnosis not present

## 2013-01-25 DIAGNOSIS — I82409 Acute embolism and thrombosis of unspecified deep veins of unspecified lower extremity: Secondary | ICD-10-CM | POA: Diagnosis not present

## 2013-01-25 DIAGNOSIS — E538 Deficiency of other specified B group vitamins: Secondary | ICD-10-CM | POA: Diagnosis not present

## 2013-01-25 DIAGNOSIS — I10 Essential (primary) hypertension: Secondary | ICD-10-CM | POA: Diagnosis not present

## 2013-01-25 DIAGNOSIS — G7000 Myasthenia gravis without (acute) exacerbation: Secondary | ICD-10-CM | POA: Diagnosis not present

## 2013-01-25 DIAGNOSIS — J438 Other emphysema: Secondary | ICD-10-CM | POA: Diagnosis not present

## 2013-02-20 DIAGNOSIS — C4491 Basal cell carcinoma of skin, unspecified: Secondary | ICD-10-CM | POA: Diagnosis not present

## 2013-02-20 DIAGNOSIS — C44519 Basal cell carcinoma of skin of other part of trunk: Secondary | ICD-10-CM | POA: Diagnosis not present

## 2013-04-26 DIAGNOSIS — C4441 Basal cell carcinoma of skin of scalp and neck: Secondary | ICD-10-CM | POA: Diagnosis not present

## 2013-04-26 DIAGNOSIS — L909 Atrophic disorder of skin, unspecified: Secondary | ICD-10-CM | POA: Diagnosis not present

## 2013-04-26 DIAGNOSIS — L905 Scar conditions and fibrosis of skin: Secondary | ICD-10-CM | POA: Diagnosis not present

## 2013-06-01 ENCOUNTER — Encounter: Payer: Self-pay | Admitting: Neurology

## 2013-06-01 ENCOUNTER — Ambulatory Visit (INDEPENDENT_AMBULATORY_CARE_PROVIDER_SITE_OTHER): Payer: Medicare Other | Admitting: Neurology

## 2013-06-01 ENCOUNTER — Encounter (INDEPENDENT_AMBULATORY_CARE_PROVIDER_SITE_OTHER): Payer: Self-pay

## 2013-06-01 VITALS — BP 130/80 | HR 65 | Ht 69.0 in | Wt 227.0 lb

## 2013-06-01 DIAGNOSIS — E538 Deficiency of other specified B group vitamins: Secondary | ICD-10-CM

## 2013-06-01 DIAGNOSIS — G7000 Myasthenia gravis without (acute) exacerbation: Secondary | ICD-10-CM | POA: Diagnosis not present

## 2013-06-01 DIAGNOSIS — C4490 Unspecified malignant neoplasm of skin, unspecified: Secondary | ICD-10-CM

## 2013-06-01 DIAGNOSIS — I2699 Other pulmonary embolism without acute cor pulmonale: Secondary | ICD-10-CM

## 2013-06-01 NOTE — Progress Notes (Signed)
HPI:   Calvin Sloan is a 80 year old right-handed white married male with generalized seropositive myasthenia gravis characterized by bulbar weakness, shortness of breath , and dysphagia.   He was diagnosed in 10/2002 with a  positive Tensilon test, positive acetylchoine receptor antibody,and had a positive ANA at 1-160 speckled pattern. Initially he had right eye ptosis and subsequently, double vision, fatigue with chewing, dysphagia, and upper and lower extremity weakness. He was initially treated with Mestinon bromide in June 2004,  which was associated with GI side effects.  Prednisone was started in 03/29/2003 and CellCept 05/2003. He developed increased jitteriness, rash, and cramps on CellCept which was discontinued 10/2003. He had progressive bulbar weakness and was admitted 12/2003 for IV IgG therapy which required a PEG tube for dysphagia transiently. He was started on Imuran. 01/17/2004 he was readmitted for 2 days of IV IgG because of shortness of breath. He has not been hospitalized since 01/2004.   He has been slowly tapered off prednisone 09/2007 and was on Imuran and Mestinon bromide.   He had B12 deficiency and was receiving B12 shots ,but  B12 levels off injections have been normal and B12 shots were discontinued.  He has  a history of vague visual loss on the left of unknown etiology. He denies swallowing problems, speech problems, voice changes, focal weakness, or double vision.  He is independent in activities of daily living and takes one nap per day.He denies memory loss but his wife states that he does have memory loss. His last blood studies in my office 05/27/10 were normal except a low white blood cell count of 3900.  [name] was concerned that the Imuran is associated with increasing risk of skin cancer and the imuran was discontinued 12/30/2010.   He has noted no change In myasthenic symptoms. His skin has improved. His medication for myasthenia is mesinon bromide 60   milligrams one and one half tablets 4 times a day. He has shortness of breath since his pulmonary emboli in 2003, also complains of excessive fatigue.   He is allergic to aspirin, GI side effect, gastric ulcer,  and is on Plavix for a history of TIAs with right arm weakness He has  swallowing problems at the beginning of the meal with liquids that improves as the meal continues.     He is going to have right CTS release in May 2014 by [name].  He complains of shortness of breath with walking, bending over to tire his shoed, due to PE 2003, pulmnolgist folllowed up, he does not like to chew on tough beef, he denies swallowing difficulty, no double vision, no droopy eyelid, no significant bleeding muscle weakness. He is active in yard, shop, threadmill   UPDATE Jan 16th 2015:  He is now tapering off Mestinon, there was no significant change in his functional status, he denies double vision, no ptosis, continue complains of shortness of breath with exertion no significant and muscle weakness, he has to wash down his food sometimes, but no significant swelling, or chewing difficulty, no dysarthria  ,   Review of Systems  Out of a complete 14 system review, the patient complains of only the following symptoms, and all other reviewed systems are negative.   Eye pain, headache, numbness, weakness    Physical Exam  General: Well developed white male.    Neurologic Exam  Mental Status: Alert and oriented to time, place, and person.  Follows one, two and three step commands.  No aphasia,  apraxia or agnosia Cranial Nerves: Visual fields are full to count fingers examination.  Discs flat.  Extraocular movements full.Corneals are present.  Hearing decreased, worse in the right ear. He has mild eye closure, cheek puff weakness.  Tongue midline, uvula midline, and gags present.  Sternocleidomastoid and trapezius testing normal. No significant pulses Motor: 5/5 strength proximally and distally in the  upper and lower extremities.  No evidence of proximal, pronator, or distal drift.  No focal atrophy and no fasciculations seen. Sensory: Intact to pinprick, light touch, joint position, and vibration testing.  Coordination:  mild outstretched hand and arm tremor.  Intact finger-to-nose, heel-to-shin, and rapid alternating movements.   Gait and Station: Can toe walk, heel walk, and tandem gait.  Romberg testing is negative. Reflexes: Postural and righting reflexes are normal.  DTRs 2+ and equal.  Plantar responses downgoing.   Assessment and plan  Systemic seropositive myasthenia gravis, doing very well, without immunosuppressive treatment since 2012, no recurrent symptoms even stop Mestinon, he has mild eye closure and cheek  We will repeat acetylcholine receptor antibody, thyroid function tests, return to clinic in 6 months

## 2013-06-08 NOTE — Progress Notes (Signed)
Quick Note:  Butch Penny: Please call patient, laboratory showed positive acetylcholine antibody consistent with his previous diagnosis of myasthenia gravis, continue with the treatment plan as we discussed during his followup visit, ______

## 2013-06-13 NOTE — Progress Notes (Signed)
Quick Note:  Spoke to patient and relayed positive acetylcholine antibody consistent with dx of myasthenia gravis, per Dr. [name]. Patient will continue current treatment plan. ______

## 2013-06-14 LAB — COMPREHENSIVE METABOLIC PANEL
A/G RATIO: 1.7 (ref 1.1–2.5)
ALBUMIN: 4.2 g/dL (ref 3.5–4.8)
ALK PHOS: 61 IU/L (ref 39–117)
ALT: 25 IU/L (ref 0–44)
AST: 26 IU/L (ref 0–40)
BILIRUBIN TOTAL: 0.4 mg/dL (ref 0.0–1.2)
BUN / CREAT RATIO: 13 (ref 10–22)
BUN: 19 mg/dL (ref 8–27)
CO2: 18 mmol/L (ref 18–29)
Calcium: 8.9 mg/dL (ref 8.6–10.2)
Chloride: 101 mmol/L (ref 97–108)
Creatinine, Ser: 1.49 mg/dL — ABNORMAL HIGH (ref 0.76–1.27)
GFR, EST AFRICAN AMERICAN: 52 mL/min/{1.73_m2} — AB (ref >59)
GFR, EST NON AFRICAN AMERICAN: 45 mL/min/{1.73_m2} — AB (ref >59)
GLUCOSE: 185 mg/dL — AB (ref 65–99)
Globulin, Total: 2.5 g/dL (ref 1.5–4.5)
POTASSIUM: 4 mmol/L (ref 3.5–5.2)
Sodium: 141 mmol/L (ref 134–144)
TOTAL PROTEIN: 6.7 g/dL (ref 6.0–8.5)

## 2013-06-14 LAB — CBC
HEMATOCRIT: 41 % (ref 37.5–51.0)
Hemoglobin: 14.3 g/dL (ref 12.6–17.7)
MCH: 30.9 pg (ref 26.6–33.0)
MCHC: 34.9 g/dL (ref 31.5–35.7)
MCV: 89 fL (ref 79–97)
Platelets: 204 10*3/uL (ref 150–379)
RBC: 4.63 x10E6/uL (ref 4.14–5.80)
RDW: 13.6 % (ref 12.3–15.4)
WBC: 4.4 10*3/uL (ref 3.4–10.8)

## 2013-06-14 LAB — ACETYLCHOLINE RECEPTOR, MODULATING: Acetylcholine Rec Mod Ab: 35 % — ABNORMAL HIGH (ref 0–20)

## 2013-06-14 LAB — THYROID PANEL WITH TSH
Free Thyroxine Index: 1.4 (ref 1.2–4.9)
T3 Uptake Ratio: 25 % (ref 24–39)
T4, Total: 5.5 ug/dL (ref 4.5–12.0)
TSH: 2.03 u[IU]/mL (ref 0.450–4.500)

## 2013-06-14 LAB — ACETYLCHOLINE RECEPTOR, BINDING: ACHR BINDING AB, SERUM: 45.9 nmol/L — AB (ref 0.00–0.24)

## 2013-08-15 DIAGNOSIS — G7000 Myasthenia gravis without (acute) exacerbation: Secondary | ICD-10-CM | POA: Diagnosis not present

## 2013-08-15 DIAGNOSIS — I10 Essential (primary) hypertension: Secondary | ICD-10-CM | POA: Diagnosis not present

## 2013-08-15 DIAGNOSIS — H9190 Unspecified hearing loss, unspecified ear: Secondary | ICD-10-CM | POA: Diagnosis not present

## 2013-10-12 DIAGNOSIS — H5200 Hypermetropia, unspecified eye: Secondary | ICD-10-CM | POA: Diagnosis not present

## 2013-10-12 DIAGNOSIS — H2510 Age-related nuclear cataract, unspecified eye: Secondary | ICD-10-CM | POA: Diagnosis not present

## 2013-10-12 DIAGNOSIS — H52229 Regular astigmatism, unspecified eye: Secondary | ICD-10-CM | POA: Diagnosis not present

## 2013-10-12 DIAGNOSIS — H524 Presbyopia: Secondary | ICD-10-CM | POA: Diagnosis not present

## 2013-11-09 DIAGNOSIS — R55 Syncope and collapse: Secondary | ICD-10-CM | POA: Diagnosis not present

## 2013-11-09 DIAGNOSIS — N419 Inflammatory disease of prostate, unspecified: Secondary | ICD-10-CM | POA: Diagnosis not present

## 2013-11-09 DIAGNOSIS — N3090 Cystitis, unspecified without hematuria: Secondary | ICD-10-CM | POA: Diagnosis not present

## 2013-11-13 DIAGNOSIS — N259 Disorder resulting from impaired renal tubular function, unspecified: Secondary | ICD-10-CM | POA: Diagnosis not present

## 2013-11-20 DIAGNOSIS — N259 Disorder resulting from impaired renal tubular function, unspecified: Secondary | ICD-10-CM | POA: Diagnosis not present

## 2013-11-28 DIAGNOSIS — I10 Essential (primary) hypertension: Secondary | ICD-10-CM | POA: Diagnosis not present

## 2013-11-28 DIAGNOSIS — N189 Chronic kidney disease, unspecified: Secondary | ICD-10-CM | POA: Diagnosis not present

## 2013-11-28 DIAGNOSIS — G7000 Myasthenia gravis without (acute) exacerbation: Secondary | ICD-10-CM | POA: Diagnosis not present

## 2013-11-28 DIAGNOSIS — N419 Inflammatory disease of prostate, unspecified: Secondary | ICD-10-CM | POA: Diagnosis not present

## 2013-11-29 ENCOUNTER — Ambulatory Visit (INDEPENDENT_AMBULATORY_CARE_PROVIDER_SITE_OTHER): Payer: Medicare Other | Admitting: Neurology

## 2013-11-29 ENCOUNTER — Encounter (INDEPENDENT_AMBULATORY_CARE_PROVIDER_SITE_OTHER): Payer: Self-pay

## 2013-11-29 ENCOUNTER — Encounter: Payer: Self-pay | Admitting: Neurology

## 2013-11-29 VITALS — BP 139/73 | HR 83 | Ht 69.0 in | Wt 224.0 lb

## 2013-11-29 DIAGNOSIS — G7000 Myasthenia gravis without (acute) exacerbation: Secondary | ICD-10-CM | POA: Diagnosis not present

## 2013-11-29 MED ORDER — PREDNISONE 10 MG PO TABS: 10.0000 mg | ORAL_TABLET | Freq: Every day | ORAL | Status: DC

## 2013-11-29 MED ORDER — PYRIDOSTIGMINE BROMIDE 60 MG PO TABS: 60.0000 mg | ORAL_TABLET | Freq: Three times a day (TID) | ORAL | Status: DC

## 2013-11-29 NOTE — Progress Notes (Signed)
HPI:   [name] is a 80 year old right-handed white married male with generalized seropositive myasthenia gravis characterized by bulbar weakness, shortness of breath , and dysphagia. Patient of [name]   He was diagnosed in 10/2002 with a  positive Tensilon test, positive acetylchoine receptor antibody,and had a positive ANA at 1-160 speckled pattern. Initially he had right eye ptosis and subsequently, double vision, fatigue with chewing, dysphagia, and upper and lower extremity weakness. He was initially treated with Mestinon bromide in June 2004,  which was associated with GI side effects.  Prednisone was started in 03/29/2003 and CellCept 05/2003. He developed increased jitteriness, rash, and cramps on CellCept which was discontinued 10/2003. He had progressive bulbar weakness and was admitted 12/2003 for IV IgG therapy which required a PEG tube for dysphagia transiently. He was started on Imuran. 01/17/2004 he was readmitted for 2 days of IV IgG because of shortness of breath. He has not been hospitalized since 01/2004.   He has been slowly tapered off prednisone 09/2007 and was on Imuran and Mestinon bromide.   He had B12 deficiency and was receiving B12 shots ,but  B12 levels off injections have been normal and B12 shots were discontinued.  He has  a history of vague visual loss on the left of unknown etiology. He denies swallowing problems, speech problems, voice changes, focal weakness, or double vision.  He is independent in activities of daily living and takes one nap per day.He denies memory loss but his wife states that he does have memory loss. His last blood studies in my office 05/27/10 were normal except a low white blood cell count of 3900.  His dermatologist [name] was concerned that the Imuran is associated with increased risk of skin cancer and the imuran was discontinued in 12/30/2010. His skin cancer (basal cell cancer) has improved since his imuran was stopped.  He has  noted no change In myasthenic symptoms. His skin has improved. His medication for myasthenia is mesinon bromide 60  milligrams one and one half tablets 4 times a day. He has shortness of breath since his pulmonary emboli in 2003, also complains of excessive fatigue.   He is allergic to aspirin, GI side effect, gastric ulcer,  and is on Plavix for a history of TIAs with right arm weakness He has  swallowing problems at the beginning of the meal with liquids that improves as the meal continues.    He is going to have right CTS release in May 2014 by [name].  He complains of shortness of breath with walking, bending over to tire his shoed, due to PE 2003, pulmnolgist folllowed up, he does not like to chew on tough beef, he denies swallowing difficulty, no double vision, no droopy eyelid, no significant bleeding muscle weakness. He is active in yard, shop, threadmill   UPDATE Jan 16th 2015: He is now tapering off Mestinon, there was no significant change in his functional status, he denies double vision, no ptosis, continue complains of shortness of breath with exertion no significant and muscle weakness, he has to wash down his food sometimes, but no significant swelling, or chewing difficulty, no dysarthria  UPDATE July 16th 2015: He is no longer mestinon, he does complains of increased breathing difficulty from prolonged walking, no chewing difficulty, no double vision, no ptosis.  Laboratory continue to demonstrate positive acetylcholine binding, and modulating antibodies.  He did reported that his skin cancer has improved after stopping imuran, could not tolerate cellcept per record.  Previously he responded to IVIG very well.  ,   Review of Systems  Out of a complete 14 system review, the patient complains of only the following symptoms, and all other reviewed systems are negative.   Eye pain, headache, numbness, weakness, shortness of breath, hearing loss, ringing ears,    Physical Exam   General: Well developed white male.    Neurologic Exam  Mental Status: Alert and oriented to time, place, and person.  Follows one, two and three step commands.  No aphasia, apraxia or agnosia Cranial Nerves: Visual fields are full to count fingers examination.  Discs flat.  Extraocular movements full.Corneals are present.  Hearing decreased, worse in the right ear. He has mild to moderate eye closure, cheek puff weakness.  Tongue midline, uvula midline, and gags present.  Sternocleidomastoid and trapezius testing normal. No significant pulses Motor: 5/5 strength proximally and distally in the upper and lower extremities.  No evidence of proximal, pronator, or distal drift.  No focal atrophy and no fasciculations seen. Sensory: Intact to pinprick, light touch, joint position, and vibration testing.  Coordination:  mild outstretched hand and arm tremor.  Intact finger-to-nose, heel-to-shin, and rapid alternating movements.   Gait and Station: Can toe walk, heel walk, and tandem gait.  Romberg testing is negative. Reflexes: Postural and righting reflexes are normal.  DTRs 2+ and equal.  Plantar responses downgoing.  Assessment and plan  Systemic seropositive myasthenia gravis, doing very well, without immunosuppressive treatment since 2012, no recurrent symptoms even stop Mestinon, he has mild to moderate eye closure and cheek.   He complains of worsening fatigue, shortness of breath with exertion, eye examinations, he continue have mild to moderate bulbar weakness, laboratory evaluation continue to demonstrate positive acetylcholine receptor antibody, he has history of multiple skin cancers, has made worse by previous Imuran treatment, could not tolerate CellCept per record due to rash, jitteriness, responded very well to IVIG treatment in the past.  After discussed with patient, we decided to proceed with low dose of prednisone 10 mg every morning, go back on Mestinon 60 mg 3 times a day, if he did  show mild to moderate improvement after treatment, this would indicate that his symptoms are myasthenia gravis related, he should be on long-term low-dose immunosuppressive treatment,  Return to clinic in 3 months,

## 2013-12-05 ENCOUNTER — Telehealth: Payer: Self-pay | Admitting: *Deleted

## 2013-12-05 MED ORDER — PYRIDOSTIGMINE BROMIDE 60 MG PO TABS: 60.0000 mg | ORAL_TABLET | Freq: Three times a day (TID) | ORAL | Status: DC

## 2013-12-05 MED ORDER — PREDNISONE 10 MG PO TABS: 10.0000 mg | ORAL_TABLET | Freq: Every day | ORAL | Status: DC

## 2013-12-05 NOTE — Telephone Encounter (Signed)
Rx's have been sent to local pharmacy per patient request

## 2014-01-10 DIAGNOSIS — C4441 Basal cell carcinoma of skin of scalp and neck: Secondary | ICD-10-CM | POA: Diagnosis not present

## 2014-01-10 DIAGNOSIS — Z85828 Personal history of other malignant neoplasm of skin: Secondary | ICD-10-CM | POA: Diagnosis not present

## 2014-01-10 DIAGNOSIS — L570 Actinic keratosis: Secondary | ICD-10-CM | POA: Diagnosis not present

## 2014-01-10 DIAGNOSIS — D485 Neoplasm of uncertain behavior of skin: Secondary | ICD-10-CM | POA: Diagnosis not present

## 2014-01-10 DIAGNOSIS — C44519 Basal cell carcinoma of skin of other part of trunk: Secondary | ICD-10-CM | POA: Diagnosis not present

## 2014-01-31 DIAGNOSIS — I517 Cardiomegaly: Secondary | ICD-10-CM | POA: Diagnosis not present

## 2014-01-31 DIAGNOSIS — Z886 Allergy status to analgesic agent status: Secondary | ICD-10-CM | POA: Diagnosis not present

## 2014-01-31 DIAGNOSIS — R0989 Other specified symptoms and signs involving the circulatory and respiratory systems: Secondary | ICD-10-CM | POA: Diagnosis not present

## 2014-01-31 DIAGNOSIS — R079 Chest pain, unspecified: Secondary | ICD-10-CM | POA: Diagnosis not present

## 2014-01-31 DIAGNOSIS — I2699 Other pulmonary embolism without acute cor pulmonale: Secondary | ICD-10-CM | POA: Diagnosis not present

## 2014-01-31 DIAGNOSIS — R059 Cough, unspecified: Secondary | ICD-10-CM | POA: Diagnosis not present

## 2014-01-31 DIAGNOSIS — G7000 Myasthenia gravis without (acute) exacerbation: Secondary | ICD-10-CM | POA: Diagnosis not present

## 2014-01-31 DIAGNOSIS — Z86711 Personal history of pulmonary embolism: Secondary | ICD-10-CM | POA: Diagnosis not present

## 2014-01-31 DIAGNOSIS — Z79899 Other long term (current) drug therapy: Secondary | ICD-10-CM | POA: Diagnosis not present

## 2014-01-31 DIAGNOSIS — Z7902 Long term (current) use of antithrombotics/antiplatelets: Secondary | ICD-10-CM | POA: Diagnosis not present

## 2014-01-31 DIAGNOSIS — I824Z9 Acute embolism and thrombosis of unspecified deep veins of unspecified distal lower extremity: Secondary | ICD-10-CM | POA: Diagnosis not present

## 2014-01-31 DIAGNOSIS — N400 Enlarged prostate without lower urinary tract symptoms: Secondary | ICD-10-CM | POA: Diagnosis present

## 2014-01-31 DIAGNOSIS — R05 Cough: Secondary | ICD-10-CM | POA: Diagnosis not present

## 2014-01-31 DIAGNOSIS — I2789 Other specified pulmonary heart diseases: Secondary | ICD-10-CM | POA: Diagnosis not present

## 2014-01-31 DIAGNOSIS — R0609 Other forms of dyspnea: Secondary | ICD-10-CM | POA: Diagnosis not present

## 2014-01-31 DIAGNOSIS — I129 Hypertensive chronic kidney disease with stage 1 through stage 4 chronic kidney disease, or unspecified chronic kidney disease: Secondary | ICD-10-CM | POA: Diagnosis not present

## 2014-01-31 DIAGNOSIS — I80299 Phlebitis and thrombophlebitis of other deep vessels of unspecified lower extremity: Secondary | ICD-10-CM | POA: Diagnosis not present

## 2014-01-31 DIAGNOSIS — IMO0002 Reserved for concepts with insufficient information to code with codable children: Secondary | ICD-10-CM | POA: Diagnosis not present

## 2014-01-31 DIAGNOSIS — N183 Chronic kidney disease, stage 3 unspecified: Secondary | ICD-10-CM | POA: Diagnosis present

## 2014-01-31 DIAGNOSIS — I824Y9 Acute embolism and thrombosis of unspecified deep veins of unspecified proximal lower extremity: Secondary | ICD-10-CM | POA: Diagnosis not present

## 2014-01-31 DIAGNOSIS — R0602 Shortness of breath: Secondary | ICD-10-CM | POA: Diagnosis not present

## 2014-01-31 DIAGNOSIS — Z91013 Allergy to seafood: Secondary | ICD-10-CM | POA: Diagnosis not present

## 2014-02-04 ENCOUNTER — Telehealth: Payer: Self-pay | Admitting: Neurology

## 2014-02-04 NOTE — Telephone Encounter (Signed)
Called and left patient a message to call me back.

## 2014-02-04 NOTE — Telephone Encounter (Signed)
Patient spouse calling to inform Dr. [name] that patient is hospitalized.  Would like to discuss patient's diagnosis of Myasthenia Gravis and patient having blood clots.  Spouse would like to discuss further.  Please return call @ [phone].

## 2014-02-05 NOTE — Telephone Encounter (Signed)
Patient's spouse returning [facility] C's call.

## 2014-02-05 NOTE — Telephone Encounter (Signed)
Called patient wife back and spoke to her patient just got of hospital and wife is concerned about Myasthenia Gravis just wants to make sure he is ok scheduled sooner appt Dr.Yan had openings.

## 2014-02-06 DIAGNOSIS — I82409 Acute embolism and thrombosis of unspecified deep veins of unspecified lower extremity: Secondary | ICD-10-CM | POA: Diagnosis not present

## 2014-02-06 DIAGNOSIS — G7000 Myasthenia gravis without (acute) exacerbation: Secondary | ICD-10-CM | POA: Diagnosis not present

## 2014-02-06 DIAGNOSIS — I2699 Other pulmonary embolism without acute cor pulmonale: Secondary | ICD-10-CM | POA: Diagnosis not present

## 2014-02-08 ENCOUNTER — Encounter (INDEPENDENT_AMBULATORY_CARE_PROVIDER_SITE_OTHER): Payer: Self-pay

## 2014-02-08 ENCOUNTER — Encounter: Payer: Self-pay | Admitting: Neurology

## 2014-02-08 ENCOUNTER — Ambulatory Visit (INDEPENDENT_AMBULATORY_CARE_PROVIDER_SITE_OTHER): Payer: Medicare Other | Admitting: Neurology

## 2014-02-08 VITALS — BP 132/74 | HR 60 | Ht 69.0 in | Wt 220.0 lb

## 2014-02-08 DIAGNOSIS — G7000 Myasthenia gravis without (acute) exacerbation: Secondary | ICD-10-CM

## 2014-02-08 DIAGNOSIS — I82409 Acute embolism and thrombosis of unspecified deep veins of unspecified lower extremity: Secondary | ICD-10-CM | POA: Diagnosis not present

## 2014-02-08 MED ORDER — PREDNISONE 10 MG PO TABS: 10.0000 mg | ORAL_TABLET | Freq: Every day | ORAL | Status: DC

## 2014-02-08 MED ORDER — PYRIDOSTIGMINE BROMIDE 60 MG PO TABS: 60.0000 mg | ORAL_TABLET | Freq: Three times a day (TID) | ORAL | Status: DC

## 2014-02-08 NOTE — Progress Notes (Signed)
HPI:   Calvin Sloan is a 80 year old right-handed white married male with generalized seropositive myasthenia gravis characterized by bulbar weakness, shortness of breath , and dysphagia. Patient of [name]   He was diagnosed in 10/2002 with a  positive Tensilon test, positive acetylchoine receptor antibody,and had a positive ANA at 1-160 speckled pattern. Initially he had right eye ptosis and subsequently, double vision, fatigue with chewing, dysphagia, and upper and lower extremity weakness. He was initially treated with Mestinon bromide in June 2004,  which was associated with GI side effects.  Prednisone was started in 03/29/2003 and CellCept 05/2003. He developed increased jitteriness, rash, and cramps on CellCept which was discontinued 10/2003. He had progressive bulbar weakness and was admitted 12/2003 for IV IgG therapy which required a PEG tube for dysphagia transiently. He was started on Imuran. 01/17/2004 he was readmitted for 2 days of IV IgG because of shortness of breath. He has not been hospitalized since 01/2004.   He has been slowly tapered off prednisone 09/2007 and was on Imuran and Mestinon bromide.   He had B12 deficiency and was receiving B12 shots ,but  B12 levels off injections have been normal and B12 shots were discontinued.  He has  a history of vague visual loss on the left of unknown etiology. He denies swallowing problems, speech problems, voice changes, focal weakness, or double vision.  He is independent in activities of daily living and takes one nap per day.He denies memory loss but his wife states that he does have memory loss. His last blood studies in my office 05/27/10 were normal except a low white blood cell count of 3900.  His dermatologist [name] was concerned that the Imuran is associated with increased risk of skin cancer and the imuran was discontinued in 12/30/2010. His skin cancer (basal cell cancer) has improved since his imuran was stopped.  He has  noted no change In myasthenic symptoms. His skin has improved. His medication for myasthenia is mesinon bromide 60  milligrams one and one half tablets 4 times a day. He has shortness of breath since his pulmonary emboli in 2003, also complains of excessive fatigue.   He is allergic to aspirin, GI side effect, gastric ulcer,  and is on Plavix for a history of TIAs with right arm weakness He has  swallowing problems at the beginning of the meal with liquids that improves as the meal continues.    He is going to have right CTS release in May 2014 by [name].  He complains of shortness of breath with walking, bending over to tire his shoed, due to PE 2003, pulmnolgist folllowed up, he does not like to chew on tough beef, he denies swallowing difficulty, no double vision, no droopy eyelid, no significant bleeding muscle weakness. He is active in yard, shop, threadmill   UPDATE Jan 16th 2015: He is now tapering off Mestinon, there was no significant change in his functional status, he denies double vision, no ptosis, continue complains of shortness of breath with exertion no significant and muscle weakness, he has to wash down his food sometimes, but no significant swelling, or chewing difficulty, no dysarthria  UPDATE July 16th 2015: He is no longer mestinon, he does complains of increased breathing difficulty from prolonged walking, no chewing difficulty, no double vision, no ptosis.  Laboratory continue to demonstrate positive acetylcholine binding, and modulating antibodies.  He did reported that his skin cancer has improved after stopping imuran, could not tolerate cellcept per record.  Previously he responded to IVIG very well.  UPDATE Sept 25th 2015: He was stated on prednisone 10mg  qday, and mestinon 60mg  tid in June 2015, which did help his fatigue, shortness of breath, wife reported 50% improvement, he has increased appetite  But he was admitted to [facility] hospital in Sept 17th for  bilateral PEs, right leg DVT, he is started on coumadin, bridge with Lovenox, [name] is monitoring his INR  He denies double vision, no ptosis, no limb muscle weakness, no dysphagia  Review of Systems  Out of a complete 14 system review, the patient complains of only the following symptoms, and all other reviewed systems are negative.   Shortness of breath, weakness, memory loss, hearing loss, ringing ear, muscle cramping    Physical Exam  General: Well developed white male.    Neurologic Exam  Mental Status: Alert and oriented to time, place, and person.  Follows one, two and three step commands.  No aphasia, apraxia or agnosia Cranial Nerves: Pupils were equal round reactive to light, bilateral fundi were sharp.  Extraocular movements were full. Hearing decreased, worse in the right ear. He has mild to moderate eye closure, cheek puff weakness. 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 Sternocleidomastoid and trapezius testing normal.  And Motor: 5/5 strength proximally and distally in the upper and lower extremities. Sensory: Intact to pinprick, light touch, Coordination:  Normal finger-to-nose heel-to-shin,  Gait and Station: Can toe walk, heel walk, and tandem gait.  Romberg testing is negative. Reflexes: Present and symmetric  Assessment and plan  80 years old male, was systemic seropositive myasthenia gravis,  complains of worsening fatigue, shortness of breath with exertion,  he continues to have mild to moderate bulbar weakness, laboratory evaluation continue to demonstrate positive acetylcholine receptor antibody, he has history of multiple skin cancers, has made worse by previous Imuran treatment, could not tolerate CellCept per record due to rash, jitteriness, responded very well to IVIG treatment in the past.  His fatigued, lack of stamina, had moderate improvement after starting prednisone 10 mg  every day, and Mestinon 60 mg 3 times a day, suggestive of myasthenia gravis somewhat account for his above complaints,  Unfortunately, he has developed right lower extremity DVT, pulmonary emboli January 31 2014, now on Coumadin treatment,  He is to continue current prednisone 10 mg every day, Mestinon 60 mg 3 times a day, return to clinic in 6 months,

## 2014-02-11 DIAGNOSIS — I2699 Other pulmonary embolism without acute cor pulmonale: Secondary | ICD-10-CM | POA: Diagnosis not present

## 2014-02-11 DIAGNOSIS — G7000 Myasthenia gravis without (acute) exacerbation: Secondary | ICD-10-CM | POA: Diagnosis not present

## 2014-02-19 DIAGNOSIS — I82401 Acute embolism and thrombosis of unspecified deep veins of right lower extremity: Secondary | ICD-10-CM | POA: Diagnosis not present

## 2014-02-19 DIAGNOSIS — G7000 Myasthenia gravis without (acute) exacerbation: Secondary | ICD-10-CM | POA: Diagnosis not present

## 2014-02-26 DIAGNOSIS — G7000 Myasthenia gravis without (acute) exacerbation: Secondary | ICD-10-CM | POA: Diagnosis not present

## 2014-02-26 DIAGNOSIS — I82401 Acute embolism and thrombosis of unspecified deep veins of right lower extremity: Secondary | ICD-10-CM | POA: Diagnosis not present

## 2014-02-26 DIAGNOSIS — I2699 Other pulmonary embolism without acute cor pulmonale: Secondary | ICD-10-CM | POA: Diagnosis not present

## 2014-03-05 ENCOUNTER — Ambulatory Visit: Payer: Medicare Other | Admitting: Neurology

## 2014-03-05 DIAGNOSIS — C44519 Basal cell carcinoma of skin of other part of trunk: Secondary | ICD-10-CM | POA: Diagnosis not present

## 2014-03-05 DIAGNOSIS — L820 Inflamed seborrheic keratosis: Secondary | ICD-10-CM | POA: Diagnosis not present

## 2014-03-12 DIAGNOSIS — I82401 Acute embolism and thrombosis of unspecified deep veins of right lower extremity: Secondary | ICD-10-CM | POA: Diagnosis not present

## 2014-03-13 DIAGNOSIS — L905 Scar conditions and fibrosis of skin: Secondary | ICD-10-CM | POA: Diagnosis not present

## 2014-03-13 DIAGNOSIS — L929 Granulomatous disorder of the skin and subcutaneous tissue, unspecified: Secondary | ICD-10-CM | POA: Diagnosis not present

## 2014-03-13 DIAGNOSIS — C4441 Basal cell carcinoma of skin of scalp and neck: Secondary | ICD-10-CM | POA: Diagnosis not present

## 2014-03-20 DIAGNOSIS — L570 Actinic keratosis: Secondary | ICD-10-CM | POA: Diagnosis not present

## 2014-03-27 ENCOUNTER — Encounter: Payer: Self-pay | Admitting: Neurology

## 2014-04-02 DIAGNOSIS — I2699 Other pulmonary embolism without acute cor pulmonale: Secondary | ICD-10-CM | POA: Diagnosis not present

## 2014-04-02 DIAGNOSIS — G7000 Myasthenia gravis without (acute) exacerbation: Secondary | ICD-10-CM | POA: Diagnosis not present

## 2014-04-02 DIAGNOSIS — L03115 Cellulitis of right lower limb: Secondary | ICD-10-CM | POA: Diagnosis not present

## 2014-04-04 DIAGNOSIS — I2699 Other pulmonary embolism without acute cor pulmonale: Secondary | ICD-10-CM | POA: Diagnosis not present

## 2014-04-04 DIAGNOSIS — G7000 Myasthenia gravis without (acute) exacerbation: Secondary | ICD-10-CM | POA: Diagnosis not present

## 2014-04-04 DIAGNOSIS — L03115 Cellulitis of right lower limb: Secondary | ICD-10-CM | POA: Diagnosis not present

## 2014-04-05 DIAGNOSIS — E785 Hyperlipidemia, unspecified: Secondary | ICD-10-CM | POA: Diagnosis not present

## 2014-04-05 DIAGNOSIS — L0390 Cellulitis, unspecified: Secondary | ICD-10-CM | POA: Diagnosis not present

## 2014-04-05 DIAGNOSIS — I2510 Atherosclerotic heart disease of native coronary artery without angina pectoris: Secondary | ICD-10-CM | POA: Diagnosis present

## 2014-04-05 DIAGNOSIS — I10 Essential (primary) hypertension: Secondary | ICD-10-CM | POA: Diagnosis not present

## 2014-04-05 DIAGNOSIS — L02415 Cutaneous abscess of right lower limb: Secondary | ICD-10-CM | POA: Diagnosis not present

## 2014-04-05 DIAGNOSIS — Z7952 Long term (current) use of systemic steroids: Secondary | ICD-10-CM | POA: Diagnosis not present

## 2014-04-05 DIAGNOSIS — G7000 Myasthenia gravis without (acute) exacerbation: Secondary | ICD-10-CM | POA: Diagnosis not present

## 2014-04-05 DIAGNOSIS — Z7901 Long term (current) use of anticoagulants: Secondary | ICD-10-CM | POA: Diagnosis not present

## 2014-04-05 DIAGNOSIS — B9562 Methicillin resistant Staphylococcus aureus infection as the cause of diseases classified elsewhere: Secondary | ICD-10-CM | POA: Diagnosis not present

## 2014-04-05 DIAGNOSIS — Z86711 Personal history of pulmonary embolism: Secondary | ICD-10-CM | POA: Diagnosis not present

## 2014-04-05 DIAGNOSIS — Z79899 Other long term (current) drug therapy: Secondary | ICD-10-CM | POA: Diagnosis not present

## 2014-04-05 DIAGNOSIS — Z86718 Personal history of other venous thrombosis and embolism: Secondary | ICD-10-CM | POA: Diagnosis not present

## 2014-04-05 DIAGNOSIS — N400 Enlarged prostate without lower urinary tract symptoms: Secondary | ICD-10-CM | POA: Diagnosis not present

## 2014-04-05 DIAGNOSIS — Z85828 Personal history of other malignant neoplasm of skin: Secondary | ICD-10-CM | POA: Diagnosis not present

## 2014-04-05 DIAGNOSIS — L03115 Cellulitis of right lower limb: Secondary | ICD-10-CM | POA: Diagnosis not present

## 2014-04-05 DIAGNOSIS — Z91013 Allergy to seafood: Secondary | ICD-10-CM | POA: Diagnosis not present

## 2014-04-05 DIAGNOSIS — L03119 Cellulitis of unspecified part of limb: Secondary | ICD-10-CM | POA: Diagnosis not present

## 2014-04-05 DIAGNOSIS — Z886 Allergy status to analgesic agent status: Secondary | ICD-10-CM | POA: Diagnosis not present

## 2014-04-06 DIAGNOSIS — L02415 Cutaneous abscess of right lower limb: Secondary | ICD-10-CM | POA: Diagnosis not present

## 2014-04-06 DIAGNOSIS — B9562 Methicillin resistant Staphylococcus aureus infection as the cause of diseases classified elsewhere: Secondary | ICD-10-CM | POA: Diagnosis not present

## 2014-04-06 DIAGNOSIS — G7000 Myasthenia gravis without (acute) exacerbation: Secondary | ICD-10-CM | POA: Diagnosis not present

## 2014-04-06 DIAGNOSIS — I10 Essential (primary) hypertension: Secondary | ICD-10-CM | POA: Diagnosis not present

## 2014-04-06 DIAGNOSIS — N400 Enlarged prostate without lower urinary tract symptoms: Secondary | ICD-10-CM | POA: Diagnosis not present

## 2014-04-06 DIAGNOSIS — E785 Hyperlipidemia, unspecified: Secondary | ICD-10-CM | POA: Diagnosis not present

## 2014-04-06 DIAGNOSIS — L03115 Cellulitis of right lower limb: Secondary | ICD-10-CM | POA: Diagnosis not present

## 2014-04-06 DIAGNOSIS — Z86718 Personal history of other venous thrombosis and embolism: Secondary | ICD-10-CM | POA: Diagnosis not present

## 2014-04-07 DIAGNOSIS — L0390 Cellulitis, unspecified: Secondary | ICD-10-CM | POA: Diagnosis not present

## 2014-04-08 DIAGNOSIS — L0390 Cellulitis, unspecified: Secondary | ICD-10-CM | POA: Diagnosis not present

## 2014-04-24 DIAGNOSIS — L03311 Cellulitis of abdominal wall: Secondary | ICD-10-CM | POA: Diagnosis not present

## 2014-05-14 DIAGNOSIS — S0502XA Injury of conjunctiva and corneal abrasion without foreign body, left eye, initial encounter: Secondary | ICD-10-CM | POA: Diagnosis not present

## 2014-05-16 DIAGNOSIS — S0502XD Injury of conjunctiva and corneal abrasion without foreign body, left eye, subsequent encounter: Secondary | ICD-10-CM | POA: Diagnosis not present

## 2014-05-22 DIAGNOSIS — I2699 Other pulmonary embolism without acute cor pulmonale: Secondary | ICD-10-CM | POA: Diagnosis not present

## 2014-05-22 DIAGNOSIS — I82401 Acute embolism and thrombosis of unspecified deep veins of right lower extremity: Secondary | ICD-10-CM | POA: Diagnosis not present

## 2014-06-01 DIAGNOSIS — J209 Acute bronchitis, unspecified: Secondary | ICD-10-CM | POA: Diagnosis not present

## 2014-06-03 DIAGNOSIS — L02415 Cutaneous abscess of right lower limb: Secondary | ICD-10-CM | POA: Diagnosis not present

## 2014-06-21 DIAGNOSIS — I82401 Acute embolism and thrombosis of unspecified deep veins of right lower extremity: Secondary | ICD-10-CM | POA: Diagnosis not present

## 2014-06-28 DIAGNOSIS — I82401 Acute embolism and thrombosis of unspecified deep veins of right lower extremity: Secondary | ICD-10-CM | POA: Diagnosis not present

## 2014-06-28 DIAGNOSIS — I2699 Other pulmonary embolism without acute cor pulmonale: Secondary | ICD-10-CM | POA: Diagnosis not present

## 2014-07-03 DIAGNOSIS — L02415 Cutaneous abscess of right lower limb: Secondary | ICD-10-CM | POA: Diagnosis not present

## 2014-07-05 DIAGNOSIS — I82401 Acute embolism and thrombosis of unspecified deep veins of right lower extremity: Secondary | ICD-10-CM | POA: Diagnosis not present

## 2014-08-14 ENCOUNTER — Ambulatory Visit: Payer: Medicare Other | Admitting: Neurology

## 2014-08-16 DIAGNOSIS — I82401 Acute embolism and thrombosis of unspecified deep veins of right lower extremity: Secondary | ICD-10-CM | POA: Diagnosis not present

## 2014-08-19 ENCOUNTER — Ambulatory Visit (INDEPENDENT_AMBULATORY_CARE_PROVIDER_SITE_OTHER): Payer: Medicare Other | Admitting: Neurology

## 2014-08-19 ENCOUNTER — Encounter: Payer: Self-pay | Admitting: Neurology

## 2014-08-19 VITALS — BP 140/75 | HR 60 | Ht 69.0 in | Wt 224.0 lb

## 2014-08-19 DIAGNOSIS — I2699 Other pulmonary embolism without acute cor pulmonale: Secondary | ICD-10-CM | POA: Diagnosis not present

## 2014-08-19 DIAGNOSIS — C4490 Unspecified malignant neoplasm of skin, unspecified: Secondary | ICD-10-CM

## 2014-08-19 DIAGNOSIS — R5383 Other fatigue: Secondary | ICD-10-CM | POA: Diagnosis not present

## 2014-08-19 DIAGNOSIS — E538 Deficiency of other specified B group vitamins: Secondary | ICD-10-CM | POA: Diagnosis not present

## 2014-08-19 DIAGNOSIS — G7000 Myasthenia gravis without (acute) exacerbation: Secondary | ICD-10-CM | POA: Diagnosis not present

## 2014-08-19 MED ORDER — PREDNISONE 5 MG PO TABS: 10.0000 mg | ORAL_TABLET | Freq: Every day | ORAL | Status: DC

## 2014-08-19 NOTE — Progress Notes (Signed)
PATIENT: Calvin Sloan DOB: [birth year]  HISTORICAL  Calvin Sloan   HPI:   Calvin Sloan is a 81 -year-old right-handed white married male with generalized seropositive myasthenia gravis characterized by bulbar weakness, shortness of breath , and dysphagia. Patient of [name]   He was diagnosed in 10/2002 with a  positive Tensilon test, positive acetylchoine receptor antibody,and had a positive ANA at 1-160 speckled pattern. Initially he had right eye ptosis and subsequently, double vision, fatigue with chewing, dysphagia, and upper and lower extremity weakness. He was initially treated with Mestinon bromide in June 2004,  which was associated with GI side effects.  Prednisone was started in 03/29/2003 and CellCept 05/2003. He developed increased jitteriness, rash, and cramps on CellCept which was discontinued 10/2003. He had progressive bulbar weakness and was admitted 12/2003 for IV IgG therapy which required a PEG tube for dysphagia transiently. He was started on Imuran. 01/17/2004 he was readmitted for 2 days of IV IgG because of shortness of breath. He has not been hospitalized since 01/2004.   He has been slowly tapered off prednisone 09/2007 and was on Imuran and Mestinon bromide.   He had B12 deficiency and was receiving B12 shots ,but  B12 levels off injections have been normal and B12 shots were discontinued.  He has  a history of vague visual loss on the left of unknown etiology. He denies swallowing problems, speech problems, voice changes, focal weakness, or double vision.  He is independent in activities of daily living and takes one nap per day.He denies memory loss but his wife states that he does have memory loss. His last blood studies in my office 05/27/10 were normal except a low white blood cell count of 3900.  His dermatologist [name] was concerned that the Imuran is associated with increased risk of skin cancer and the imuran was discontinued in 12/30/2010. His  skin cancer (basal cell cancer) has improved since his imuran was stopped.  He has noted no change In myasthenic symptoms. His skin has improved. His medication for myasthenia is mesinon bromide 60  milligrams one and one half tablets 4 times a day. He has shortness of breath since his pulmonary emboli in 2003, also complains of excessive fatigue.   He is allergic to aspirin, GI side effect, gastric ulcer,  and is on Plavix for a history of TIAs with right arm weakness He has  swallowing problems at the beginning of the meal with liquids that improves as the meal continues.    He had right CTS release in May 2014 by [name].  He complains of shortness of breath with walking, bending over to tire his shoed, due to PE 2003, pulmnolgist folllowed up, he does not like to chew on tough beef, he denies swallowing difficulty, no double vision, no droopy eyelid, no significant bleeding muscle weakness. He is active in yard, shop, threadmill   UPDATE Jan 16th 2015: He is now tapering off Mestinon, there was no significant change in his functional status, he denies double vision, no ptosis, continue complains of shortness of breath with exertion no significant and muscle weakness, he has to wash down his food sometimes, but no significant swelling, or chewing difficulty, no dysarthria  UPDATE July 16th 2015: He is no longer mestinon, he does complains of increased breathing difficulty from prolonged walking, no chewing difficulty, no double vision, no ptosis.  Laboratory continue to demonstrate positive acetylcholine binding, and modulating antibodies.  He did reported that  his skin cancer has improved after stopping imuran, could not tolerate cellcept per record.  Previously he responded to IVIG very well.  UPDATE Sept 25th 2015: He was stated on prednisone 10mg  qday, and mestinon 60mg  tid in June 2015, which did help his fatigue, shortness of breath, wife reported 50% improvement, he has increased  appetite  But he was admitted to [facility] hospital in Sept 17th for bilateral PEs, right leg DVT, he is started on coumadin, bridge with Lovenox, [name] is monitoring his INR  He denies double vision, no ptosis, no limb muscle weakness, no dysphagia  UPDATE April 4th 2016: He is on coumadin for PE, INR was monitored by [name], his myasthenia gravis is under good control, is taking prednisone 10 mg every day, he has surgery in Nov 2015 for right leg cellulitis.  He only does little around his house, he has no double vision, SOB due to previous PE,     REVIEW OF SYSTEMS: Full 14 system review of systems performed and notable only for as above  ALLERGIES: Allergies  Allergen Reactions  . Aspirin   . Shellfish Allergy     HOME MEDICATIONS: Current Outpatient Prescriptions  Medication Sig Dispense Refill  . predniSONE (DELTASONE) 10 MG tablet Take 1 tablet (10 mg total) by mouth daily with breakfast. 90 tablet 3  . pyridostigmine (MESTINON) 60 MG tablet Take 1 tablet (60 mg total) by mouth 3 (three) times daily. 270 tablet 3  . valsartan-hydrochlorothiazide (DIOVAN-HCT) 320-25 MG per tablet Take 1 tablet by mouth daily.    [name] warfarin (COUMADIN) 5 MG tablet 5 mg daily.     No current facility-administered medications for this visit.    PAST MEDICAL HISTORY: Past Medical History  Diagnosis Date  . Hypertension   . Pulmonary emboli     in Nov 2003  . B12 deficiency   . Essential tremor   . Skin cancer   . Myasthenia gravis     since 2004  . Memory deficit     PAST SURGICAL HISTORY: Past Surgical History  Procedure Laterality Date  . Foot surgery  1966    lt orif foot  . Skin cancer excision    . Cholecystectomy  2012    [facility]  . Colonoscopy    . Carpal tunnel release Right 09/14/2012    Procedure: CARPAL TUNNEL RELEASE;  Surgeon: Cammie Sickle., MD;  Location: [place];  Service: Orthopedics;  Laterality: Right;    FAMILY  HISTORY: Family History  Problem Relation Age of Onset  . Heart disease Mother   . Heart disease Father     SOCIAL HISTORY:  History   Social History  . Marital Status: Married    Spouse Name: [name]  . Number of Children: 2  . Years of Education: 12   Occupational History  . Plant     Retired   Social History Main Topics  . Smoking status: Former Smoker    Quit date: 05/17/1968  . Smokeless tobacco: Never Used  . Alcohol Use: No  . Drug Use: No  . Sexual Activity: Not on file   Other Topics Concern  . Not on file   Social History Narrative   He lives with his wife [name](, has 2 children, retired. Hard of hearing.   Right handed.   Caffeine- None     PHYSICAL EXAM   Filed Vitals:   08/19/14 1219  BP: 140/75  Pulse: 60  Height: 5\' 9"  (  1.753 m)  Weight: 224 lb (101.606 kg)    Not recorded      Body mass index is 33.06 kg/(m^2).  PHYSICAL EXAMNIATION:  Gen: NAD, conversant, well nourised, obese, well groomed                     Cardiovascular: Regular rate rhythm, no peripheral edema, warm, nontender. Eyes: Conjunctivae clear without exudates or hemorrhage Neck: Supple, no carotid bruise. Pulmonary: Clear to auscultation bilaterally   NEUROLOGICAL EXAM:  MENTAL STATUS: Speech:    Speech is normal; fluent and spontaneous with normal comprehension.  Cognition:    The patient is oriented to person, place, and time;     recent and remote memory intact;     language fluent;     normal attention, concentration,     fund of knowledge.  CRANIAL NERVES: CN II: Visual fields are full to confrontation. Fundoscopic exam is normal with sharp discs and no vascular changes. Venous pulsations are present bilaterally. Pupils are 4 mm and briskly reactive to light. Visual acuity is 20/20 bilaterally. CN III, IV, VI: extraocular movement are normal. No ptosis. CN V: Facial sensation is intact to pinprick in all 3 divisions bilaterally. Corneal responses are  intact.  CN VII: Face is symmetric with normal eye closure and smile. CN VIII: Hearing is normal to rubbing fingers CN IX, X: Palate elevates symmetrically. Phonation is normal. CN XI: Head turning and shoulder shrug are intact CN XII: Tongue is midline with normal movements and no atrophy.  MOTOR: There is no pronator drift of out-stretched arms. Muscle bulk and tone are normal. Muscle strength is normal.   Shoulder abduction Shoulder external rotation Elbow flexion Elbow extension Wrist flexion Wrist extension Finger abduction Hip flexion Knee flexion Knee extension Ankle dorsi flexion Ankle plantar flexion  R 5 5 5 5 5 5 5 5 5 5 5 5   L 5 5 5 5 5 5 5 5 5 5 5 5     REFLEXES: Reflexes are 2+ and symmetric at the biceps, triceps, knees, and ankles. Plantar responses are flexor.  SENSORY: Light touch, pinprick, position sense, and vibration sense are intact in fingers and toes.  COORDINATION: Rapid alternating movements and fine finger movements are intact. There is no dysmetria on finger-to-nose and heel-knee-shin. There are no abnormal or extraneous movements.   GAIT/STANCE: Wide based cautious gait Romberg is absent.   DIAGNOSTIC DATA (LABS, IMAGING, TESTING) - I reviewed patient records, labs, notes, testing and imaging myself where available.  Lab Results  Component Value Date   WBC 4.4 06/01/2013   HGB 14.3 06/01/2013   HCT 41.0 06/01/2013   MCV 89 06/01/2013   PLT 204 06/01/2013      Component Value Date/Time   NA 141 06/01/2013 1337   NA 138 09/11/2012 1430   K 4.0 06/01/2013 1337   CL 101 06/01/2013 1337   CO2 18 06/01/2013 1337   GLUCOSE 185* 06/01/2013 1337   GLUCOSE 177* 09/11/2012 1430   BUN 19 06/01/2013 1337   BUN 23 09/11/2012 1430   CREATININE 1.49* 06/01/2013 1337   CALCIUM 8.9 06/01/2013 1337   PROT 6.7 06/01/2013 1337   AST 26 06/01/2013 1337   ALT 25 06/01/2013 1337   ALKPHOS 61 06/01/2013 1337   BILITOT 0.4 06/01/2013 1337   GFRNONAA 45*  06/01/2013 1337   GFRAA 52* 06/01/2013 1337   No results found for: CHOL, HDL, LDLCALC, LDLDIRECT, TRIG, CHOLHDL No results found for: HGBA1C No results  found for: VITAMINB12 Lab Results  Component Value Date   TSH 2.030 06/01/2013      ASSESSMENT AND PLAN  LYDELL MOGA is a 81 y.o. male with generalized seropositive myasthenia gravis,  complains of worsening fatigue, shortness of breath with exertion, this could due to his recurrent history of PE, laboratory evaluation continue to demonstrate positive acetylcholine receptor antibody, he has history of multiple skin cancers, has made worse by previous Imuran treatment, could not tolerate CellCept per record due to rash, jitteriness, responded very well to IVIG treatment in the past.  His fatigued, lack of stamina, had moderate improvement after starting prednisone 10 mg every day, and Mestinon 60 mg 3 times a day, suggestive of myasthenia gravis somewhat account for his above complaints, There was no significant weakness on examination, I would decrease prednisone to 5 mg daily continue Mestinon 60 mg 3 times a day Return to clinic in 6 months,   Marcial Pacas, M.D. Ph.D.  [facility] [address], [place], [place] [address] Ph: [phone] Fax: [phone]

## 2014-08-20 ENCOUNTER — Telehealth: Payer: Self-pay | Admitting: Neurology

## 2014-08-20 DIAGNOSIS — E538 Deficiency of other specified B group vitamins: Secondary | ICD-10-CM

## 2014-08-20 LAB — COMPREHENSIVE METABOLIC PANEL
A/G RATIO: 1.6 (ref 1.1–2.5)
ALBUMIN: 4.2 g/dL (ref 3.5–4.8)
ALT: 27 IU/L (ref 0–44)
AST: 24 IU/L (ref 0–40)
Alkaline Phosphatase: 50 IU/L (ref 39–117)
BILIRUBIN TOTAL: 0.4 mg/dL (ref 0.0–1.2)
BUN / CREAT RATIO: 16 (ref 10–22)
BUN: 18 mg/dL (ref 8–27)
CO2: 24 mmol/L (ref 18–29)
Calcium: 9.5 mg/dL (ref 8.6–10.2)
Chloride: 98 mmol/L (ref 97–108)
Creatinine, Ser: 1.13 mg/dL (ref 0.76–1.27)
GFR calc Af Amer: 71 mL/min/{1.73_m2} (ref >59)
GFR calc non Af Amer: 61 mL/min/{1.73_m2} (ref >59)
GLOBULIN, TOTAL: 2.6 g/dL (ref 1.5–4.5)
Glucose: 124 mg/dL — ABNORMAL HIGH (ref 65–99)
Potassium: 4.9 mmol/L (ref 3.5–5.2)
SODIUM: 140 mmol/L (ref 134–144)
TOTAL PROTEIN: 6.8 g/dL (ref 6.0–8.5)

## 2014-08-20 LAB — CK: Total CK: 114 U/L (ref 24–204)

## 2014-08-20 LAB — VITAMIN B12: VITAMIN B 12: 208 pg/mL — AB (ref 211–946)

## 2014-08-20 LAB — MAGNESIUM: MAGNESIUM: 2 mg/dL (ref 1.6–2.3)

## 2014-08-20 LAB — THYROID PANEL WITH TSH
Free Thyroxine Index: 1.8 (ref 1.2–4.9)
T3 Uptake Ratio: 30 % (ref 24–39)
T4, Total: 5.9 ug/dL (ref 4.5–12.0)
TSH: 1.07 u[IU]/mL (ref 0.450–4.500)

## 2014-08-20 NOTE — Telephone Encounter (Signed)
Calvin Sloan, Calvin Sloan, laboratory showed mildly low B12 208, he needs to have repeat B12 level, methylmalonic acid level, B12 IM supplement, if he wants, he may receive injections through our office,

## 2014-08-21 NOTE — Telephone Encounter (Signed)
Dr. [name] - ph [phone], fax [phone].  Calvin Sloan would like a call back on her cell phone [phone] with appointment instructions for injections at PCP.

## 2014-08-21 NOTE — Telephone Encounter (Signed)
Spoke to pt's wife, [name] - she will bring him to the office for repeat labs.  Said his PCP, Dr. [name], is more convenient for B12 injections.  I will call them to get him set up.

## 2014-08-23 ENCOUNTER — Other Ambulatory Visit (INDEPENDENT_AMBULATORY_CARE_PROVIDER_SITE_OTHER): Payer: Self-pay

## 2014-08-23 ENCOUNTER — Other Ambulatory Visit: Payer: Self-pay | Admitting: *Deleted

## 2014-08-23 ENCOUNTER — Other Ambulatory Visit: Payer: Self-pay | Admitting: Neurology

## 2014-08-23 DIAGNOSIS — E538 Deficiency of other specified B group vitamins: Secondary | ICD-10-CM | POA: Diagnosis not present

## 2014-08-23 DIAGNOSIS — Z0289 Encounter for other administrative examinations: Secondary | ICD-10-CM

## 2014-08-23 NOTE — Telephone Encounter (Signed)
Orders faxed to Calvin Sloan's attention (Dr. [name] assistant). Vitamin B12 1000mg  IM -once a day x 1 week -once a week x 1 month -then once per month.

## 2014-08-25 LAB — FOLATE: Folate: 12.4 ng/mL (ref >3.0)

## 2014-08-25 LAB — METHYLMALONIC ACID, SERUM: METHYLMALONIC ACID: 370 nmol/L (ref 0–378)

## 2014-08-25 LAB — VITAMIN B12: Vitamin B-12: 204 pg/mL — ABNORMAL LOW (ref 211–946)

## 2014-08-28 DIAGNOSIS — E538 Deficiency of other specified B group vitamins: Secondary | ICD-10-CM | POA: Diagnosis not present

## 2014-08-28 DIAGNOSIS — N400 Enlarged prostate without lower urinary tract symptoms: Secondary | ICD-10-CM | POA: Diagnosis not present

## 2014-08-28 DIAGNOSIS — G7000 Myasthenia gravis without (acute) exacerbation: Secondary | ICD-10-CM | POA: Diagnosis not present

## 2014-08-28 DIAGNOSIS — K649 Unspecified hemorrhoids: Secondary | ICD-10-CM | POA: Diagnosis not present

## 2014-09-03 ENCOUNTER — Telehealth: Payer: Self-pay | Admitting: Neurology

## 2014-09-03 NOTE — Telephone Encounter (Signed)
[name], patient's spouse called stating that she spoke with Noberto Retort, RN yesterday 09/02/14. She stated it was regarding patient's B-12 shot. [name] states that she spoke with patient's primary care provider. Patient seems to get sick every time he had a B-12 shot. Please call and advice. C/b [phone]

## 2014-09-03 NOTE — Telephone Encounter (Signed)
Spoke to Matthews - pt is getting some diarrhea after his B12 injections.  She wanted to know if it okay for him to use Imodium AD to counteract his symptoms.  I told her he could use it.  I ask her to call us back if his diarrhea becomes worse or intolerable.

## 2014-09-18 DIAGNOSIS — K649 Unspecified hemorrhoids: Secondary | ICD-10-CM | POA: Diagnosis not present

## 2014-09-18 DIAGNOSIS — E538 Deficiency of other specified B group vitamins: Secondary | ICD-10-CM | POA: Diagnosis not present

## 2014-09-23 DIAGNOSIS — K649 Unspecified hemorrhoids: Secondary | ICD-10-CM | POA: Diagnosis not present

## 2014-09-23 DIAGNOSIS — K625 Hemorrhage of anus and rectum: Secondary | ICD-10-CM | POA: Diagnosis not present

## 2014-09-27 DIAGNOSIS — I82401 Acute embolism and thrombosis of unspecified deep veins of right lower extremity: Secondary | ICD-10-CM | POA: Diagnosis not present

## 2014-10-09 DIAGNOSIS — I10 Essential (primary) hypertension: Secondary | ICD-10-CM | POA: Diagnosis not present

## 2014-10-09 DIAGNOSIS — G7000 Myasthenia gravis without (acute) exacerbation: Secondary | ICD-10-CM | POA: Diagnosis not present

## 2014-10-09 DIAGNOSIS — N400 Enlarged prostate without lower urinary tract symptoms: Secondary | ICD-10-CM | POA: Diagnosis not present

## 2014-10-09 DIAGNOSIS — E538 Deficiency of other specified B group vitamins: Secondary | ICD-10-CM | POA: Diagnosis not present

## 2014-10-28 DIAGNOSIS — I82401 Acute embolism and thrombosis of unspecified deep veins of right lower extremity: Secondary | ICD-10-CM | POA: Diagnosis not present

## 2014-10-30 DIAGNOSIS — J399 Disease of upper respiratory tract, unspecified: Secondary | ICD-10-CM | POA: Diagnosis not present

## 2014-11-07 DIAGNOSIS — Z86718 Personal history of other venous thrombosis and embolism: Secondary | ICD-10-CM | POA: Diagnosis not present

## 2014-11-07 DIAGNOSIS — K644 Residual hemorrhoidal skin tags: Secondary | ICD-10-CM | POA: Diagnosis not present

## 2014-11-07 DIAGNOSIS — K625 Hemorrhage of anus and rectum: Secondary | ICD-10-CM | POA: Diagnosis not present

## 2014-11-07 DIAGNOSIS — K648 Other hemorrhoids: Secondary | ICD-10-CM | POA: Diagnosis not present

## 2014-11-07 DIAGNOSIS — K922 Gastrointestinal hemorrhage, unspecified: Secondary | ICD-10-CM | POA: Diagnosis not present

## 2014-11-07 DIAGNOSIS — Z86711 Personal history of pulmonary embolism: Secondary | ICD-10-CM | POA: Diagnosis not present

## 2014-11-07 DIAGNOSIS — Z886 Allergy status to analgesic agent status: Secondary | ICD-10-CM | POA: Diagnosis not present

## 2014-11-07 DIAGNOSIS — M1990 Unspecified osteoarthritis, unspecified site: Secondary | ICD-10-CM | POA: Diagnosis not present

## 2014-11-07 DIAGNOSIS — K5730 Diverticulosis of large intestine without perforation or abscess without bleeding: Secondary | ICD-10-CM | POA: Diagnosis not present

## 2014-11-07 DIAGNOSIS — Z91013 Allergy to seafood: Secondary | ICD-10-CM | POA: Diagnosis not present

## 2014-11-07 DIAGNOSIS — J449 Chronic obstructive pulmonary disease, unspecified: Secondary | ICD-10-CM | POA: Diagnosis not present

## 2014-11-07 DIAGNOSIS — G7000 Myasthenia gravis without (acute) exacerbation: Secondary | ICD-10-CM | POA: Diagnosis not present

## 2014-11-07 DIAGNOSIS — I10 Essential (primary) hypertension: Secondary | ICD-10-CM | POA: Diagnosis not present

## 2014-11-07 DIAGNOSIS — Z7901 Long term (current) use of anticoagulants: Secondary | ICD-10-CM | POA: Diagnosis not present

## 2014-11-07 DIAGNOSIS — Z79899 Other long term (current) drug therapy: Secondary | ICD-10-CM | POA: Diagnosis not present

## 2014-11-12 DIAGNOSIS — J019 Acute sinusitis, unspecified: Secondary | ICD-10-CM | POA: Diagnosis not present

## 2014-11-12 DIAGNOSIS — R05 Cough: Secondary | ICD-10-CM | POA: Diagnosis not present

## 2014-11-25 DIAGNOSIS — I2699 Other pulmonary embolism without acute cor pulmonale: Secondary | ICD-10-CM | POA: Diagnosis not present

## 2014-11-25 DIAGNOSIS — Z86711 Personal history of pulmonary embolism: Secondary | ICD-10-CM | POA: Diagnosis not present

## 2014-11-25 DIAGNOSIS — I82401 Acute embolism and thrombosis of unspecified deep veins of right lower extremity: Secondary | ICD-10-CM | POA: Diagnosis not present

## 2014-12-03 ENCOUNTER — Other Ambulatory Visit: Payer: Self-pay | Admitting: Neurology

## 2014-12-09 DIAGNOSIS — I82401 Acute embolism and thrombosis of unspecified deep veins of right lower extremity: Secondary | ICD-10-CM | POA: Diagnosis not present

## 2015-01-09 DIAGNOSIS — I2699 Other pulmonary embolism without acute cor pulmonale: Secondary | ICD-10-CM | POA: Diagnosis not present

## 2015-02-18 ENCOUNTER — Ambulatory Visit: Payer: Medicare Other | Admitting: Neurology

## 2015-02-18 ENCOUNTER — Ambulatory Visit (INDEPENDENT_AMBULATORY_CARE_PROVIDER_SITE_OTHER): Payer: Medicare Other | Admitting: Neurology

## 2015-02-18 ENCOUNTER — Encounter: Payer: Self-pay | Admitting: Neurology

## 2015-02-18 VITALS — BP 133/73 | HR 58 | Ht 69.0 in | Wt 221.0 lb

## 2015-02-18 DIAGNOSIS — G7000 Myasthenia gravis without (acute) exacerbation: Secondary | ICD-10-CM

## 2015-02-18 DIAGNOSIS — E538 Deficiency of other specified B group vitamins: Secondary | ICD-10-CM | POA: Diagnosis not present

## 2015-02-18 NOTE — Progress Notes (Signed)
Chief Complaint  Patient presents with  . Myasthenia Gravis    MMSE 28/30 - 19 animals.  He is here with his wife,Betty.  Feels his memory is some worse.  He has also had some choking events while eating and drinking.      PATIENT: Calvin Sloan DOB: 02-Jul-1933  HISTORICAL  Calvin Sloan  is a 81 -year-old right-handed white married male with generalized seropositive myasthenia gravis characterized by bulbar weakness, shortness of breath , and dysphagia. Patient of [name]   He was diagnosed in 10/2002 with a  positive Tensilon test, positive acetylchoine receptor antibody,and had a positive ANA at 1-160 speckled pattern. Initially he had right eye ptosis and subsequently, double vision, fatigue with chewing, dysphagia, and upper and lower extremity weakness. He was initially treated with Mestinon bromide in June 2004,  which was associated with GI side effects.  Prednisone was started in 03/29/2003 and CellCept 05/2003. He developed increased jitteriness, rash, and cramps on CellCept which was discontinued 10/2003. He had progressive bulbar weakness and was admitted 12/2003 for IV IgG therapy which required a PEG tube for dysphagia transiently. He was started on Imuran. 01/17/2004 he was readmitted for 2 days of IV IgG because of shortness of breath. He has not been hospitalized since 01/2004.   He has been slowly tapered off prednisone 09/2007 and was on Imuran and Mestinon bromide.   He had B12 deficiency and was receiving B12 shots ,but  B12 levels off injections have been normal and B12 shots were discontinued.  He has  a history of vague visual loss on the left of unknown etiology. He denies swallowing problems, speech problems, voice changes, focal weakness, or double vision.  He is independent in activities of daily living and takes one nap per day.He denies memory loss but his wife states that he does have memory loss. His last blood studies in my office 05/27/10 were normal except a low  white blood cell count of 3900.  His dermatologist [name] was concerned that the Imuran is associated with increased risk of skin cancer and the imuran was discontinued in 12/30/2010. His skin cancer (basal cell cancer) has improved since his imuran was stopped.  He has noted no change In myasthenic symptoms. His skin has improved. His medication for myasthenia is mesinon bromide 60  milligrams one and one half tablets 4 times a day. He has shortness of breath since his pulmonary emboli in 2003, also complains of excessive fatigue.   He is allergic to aspirin, GI side effect, gastric ulcer,  and is on Plavix for a history of TIAs with right arm weakness He has  swallowing problems at the beginning of the meal with liquids that improves as the meal continues.    He had right CTS release in May 2014 by [name].  He complains of shortness of breath with walking, bending over to tire his shoed, due to PE 2003, pulmnolgist folllowed up, he does not like to chew on tough beef, he denies swallowing difficulty, no double vision, no droopy eyelid, no significant bleeding muscle weakness. He is active in yard, shop, threadmill   UPDATE Jan 16th 2015: He is now tapering off Mestinon, there was no significant change in his functional status, he denies double vision, no ptosis, continue complains of shortness of breath with exertion no significant and muscle weakness, he has to wash down his food sometimes, but no significant swelling, or chewing difficulty, no dysarthria  UPDATE July  16th 2015: He is no longer mestinon, he does complains of increased breathing difficulty from prolonged walking, no chewing difficulty, no double vision, no ptosis.  Laboratory continue to demonstrate positive acetylcholine binding, and modulating antibodies.  He did reported that his skin cancer has improved after stopping imuran, could not tolerate cellcept per record.  Previously he responded to IVIG very  well.  UPDATE Sept 25th 2015: He was stated on prednisone 10mg  qday, and mestinon 60mg  tid in June 2015, which did help his fatigue, shortness of breath, wife reported 50% improvement, he has increased appetite  But he was admitted to [facility] hospital in Sept 17th for bilateral PEs, right leg DVT, he is started on coumadin, bridge with Lovenox, [name] is monitoring his INR  He denies double vision, no ptosis, no limb muscle weakness, no dysphagia  UPDATE April 4th 2016: He is on coumadin for PE, INR was monitored by [name], his myasthenia gravis is under good control, is taking prednisone 10 mg every day, he has surgery in Nov 2015 for right leg cellulitis.  He only does little around his house, he has no double vision, SOB due to previous PE,   UPDATE Feb 18 2015: Previous laboratory evaluation showed mild B12 deficiency with level of 204, He did receive IM B12 supplement at his primary care Dr. [name] office for a while, now with normal repeat laboratory evaluation, injection has stopped He has mild choking difficulty, this has been going on since August 2016, not getting worse,   he has more trouble with liquid. He complains of shortness of breath when bending over. He denies significant gait difficulty, no breathing difficulty otherwise, he denies double vision, no ptosis,, No chewing difficulties     REVIEW OF SYSTEMS: Full 14 system review of systems performed and notable only for as above  ALLERGIES: Allergies  Allergen Reactions  . Aspirin   . Shellfish Allergy     HOME MEDICATIONS: Current Outpatient Prescriptions  Medication Sig Dispense Refill  . predniSONE (DELTASONE) 5 MG tablet Take 2 tablets (10 mg total) by mouth daily with breakfast. 30 tablet 11  . pyridostigmine (MESTINON) 60 MG tablet TAKE 1 TABLET THREE TIMES DAILY 270 tablet 2  . valsartan-hydrochlorothiazide (DIOVAN-HCT) 320-25 MG per tablet Take 1 tablet by mouth daily.    [name] warfarin (COUMADIN) 5 MG  tablet 5 mg daily.     No current facility-administered medications for this visit.    PAST MEDICAL HISTORY: Past Medical History  Diagnosis Date  . Hypertension   . Pulmonary emboli ([place])     in Nov 2003  . B12 deficiency   . Essential tremor   . Skin cancer   . Myasthenia gravis ([place])     since 2004  . Memory deficit     PAST SURGICAL HISTORY: Past Surgical History  Procedure Laterality Date  . Foot surgery  1966    lt orif foot  . Skin cancer excision    . Cholecystectomy  2012    [facility]  . Colonoscopy    . Carpal tunnel release Right 09/14/2012    Procedure: CARPAL TUNNEL RELEASE;  Surgeon: Cammie Sickle., MD;  Location: [place];  Service: Orthopedics;  Laterality: Right;    FAMILY HISTORY: Family History  Problem Relation Age of Onset  . Heart disease Mother   . Heart disease Father     SOCIAL HISTORY:  Social History   Social History  . Marital Status: Married  Spouse Name: [name]  . Number of Children: 2  . Years of Education: 12   Occupational History  . Plant     Retired   Social History Main Topics  . Smoking status: Former Smoker    Quit date: 05/17/1968  . Smokeless tobacco: Never Used  . Alcohol Use: No  . Drug Use: No  . Sexual Activity: Not on file   Other Topics Concern  . Not on file   Social History Narrative   He lives with his wife [name](, has 2 children, retired. Hard of hearing.   Right handed.   Caffeine- None     PHYSICAL EXAM   Filed Vitals:   02/18/15 1147  BP: 133/73  Pulse: 58  Height: 5\' 9"  (1.753 m)  Weight: 221 lb (100.245 kg)    Not recorded      Body mass index is 32.62 kg/(m^2).  PHYSICAL EXAMNIATION:  Gen: NAD, conversant, well nourised, obese, well groomed                     Cardiovascular: Regular rate rhythm, no peripheral edema, warm, nontender. Eyes: Conjunctivae clear without exudates or hemorrhage Neck: Supple, no carotid bruise. Pulmonary: Clear to  auscultation bilaterally   NEUROLOGICAL EXAM:  MENTAL STATUS: Speech:    Speech is normal; fluent and spontaneous with normal comprehension.  Cognition:    The patient is oriented to person, place, and time;     recent and remote memory intact;     language fluent;     normal attention, concentration,     fund of knowledge.  CRANIAL NERVES: CN II: Visual fields are full to confrontation. Fundoscopic exam is normal with sharp discs and no vascular changes. Venous pulsations are present bilaterally. Pupils are 4 mm and briskly reactive to light. Visual acuity is 20/20 bilaterally. CN III, IV, VI: extraocular movement are normal. No ptosis. CN V: Facial sensation is intact to pinprick in all 3 divisions bilaterally. Corneal responses are intact.  CN VII: he has mild to moderate eye-closure, cheek puff weakness.  CN VIII: Hearing is normal to rubbing fingers CN IX, X: Palate elevates symmetrically. Phonation is normal. CN XI: Head turning and shoulder shrug are intact CN XII: Tongue is midline with normal movements and no atrophy.  MOTOR: He has slight neck flexion weakness,    REFLEXES: Reflexes are 2+ and symmetric at the biceps, triceps, knees, and ankles. Plantar responses are flexor.  SENSORY: Light touch, pinprick, position sense, and vibration sense are intact in fingers and toes.  COORDINATION: Rapid alternating movements and fine finger movements are intact. There is no dysmetria on finger-to-nose and heel-knee-shin. There are no abnormal or extraneous movements.   GAIT/STANCE: Wide based cautious gait Romberg is absent.   DIAGNOSTIC DATA (LABS, IMAGING, TESTING) - I reviewed patient records, labs, notes, testing and imaging myself where available.  Lab Results  Component Value Date   WBC 4.4 06/01/2013   HGB 14.3 06/01/2013   HCT 41.0 06/01/2013   MCV 89 06/01/2013   PLT 204 06/01/2013      Component Value Date/Time   NA 140 08/19/2014 1304   NA 138  09/11/2012 1430   K 4.9 08/19/2014 1304   CL 98 08/19/2014 1304   CO2 24 08/19/2014 1304   GLUCOSE 124* 08/19/2014 1304   GLUCOSE 177* 09/11/2012 1430   BUN 18 08/19/2014 1304   BUN 23 09/11/2012 1430   CREATININE 1.13 08/19/2014 1304   CALCIUM 9.5 08/19/2014  1304   PROT 6.8 08/19/2014 1304   AST 24 08/19/2014 1304   ALT 27 08/19/2014 1304   ALKPHOS 50 08/19/2014 1304   BILITOT 0.4 08/19/2014 1304   BILITOT 0.4 06/01/2013 1337   GFRNONAA 61 08/19/2014 1304   GFRAA 71 08/19/2014 1304   No results found for: CHOL, HDL, LDLCALC, LDLDIRECT, TRIG, CHOLHDL No results found for: HGBA1C Lab Results  Component Value Date   VITAMINB12 204* 08/23/2014   Lab Results  Component Value Date   TSH 1.070 08/19/2014      ASSESSMENT AND PLAN  Calvin Sloan is a 81 y.o. male   Serum positive generalized myasthenia gravis   Could not tolerate CellCept in the past due to GI side effect, jitteriness, worsening skin condition  Imuran caused worsening skin cancer  He has been on tapering dose of prednisone, currently taking 5 mg daily  Also Mestinon 60 mg 3 times a day, he was taking after each meal, advised him to move Mestinon 30 minutes before each meal,  His worsening mild to choking episode could be related to his bulbar weakness,  He is advised to call clinic for worsening muscle weakness, otherwise return to clinic in 3 months.  Marcial Pacas, M.D. Ph.D.  [facility] [address], [place], [place] [address] Ph: [phone] Fax: [phone]

## 2015-02-20 DIAGNOSIS — I82401 Acute embolism and thrombosis of unspecified deep veins of right lower extremity: Secondary | ICD-10-CM | POA: Diagnosis not present

## 2015-04-02 DIAGNOSIS — I2699 Other pulmonary embolism without acute cor pulmonale: Secondary | ICD-10-CM | POA: Diagnosis not present

## 2015-04-14 DIAGNOSIS — Z7901 Long term (current) use of anticoagulants: Secondary | ICD-10-CM | POA: Diagnosis not present

## 2015-04-14 DIAGNOSIS — T5991XA Toxic effect of unspecified gases, fumes and vapors, accidental (unintentional), initial encounter: Secondary | ICD-10-CM | POA: Diagnosis not present

## 2015-04-14 DIAGNOSIS — Z86718 Personal history of other venous thrombosis and embolism: Secondary | ICD-10-CM | POA: Diagnosis not present

## 2015-04-14 DIAGNOSIS — Z7952 Long term (current) use of systemic steroids: Secondary | ICD-10-CM | POA: Diagnosis not present

## 2015-04-14 DIAGNOSIS — T2642XA Burn of left eye and adnexa, part unspecified, initial encounter: Secondary | ICD-10-CM | POA: Diagnosis not present

## 2015-04-14 DIAGNOSIS — I10 Essential (primary) hypertension: Secondary | ICD-10-CM | POA: Diagnosis not present

## 2015-04-14 DIAGNOSIS — Z87891 Personal history of nicotine dependence: Secondary | ICD-10-CM | POA: Diagnosis not present

## 2015-04-14 DIAGNOSIS — T2641XA Burn of right eye and adnexa, part unspecified, initial encounter: Secondary | ICD-10-CM | POA: Diagnosis not present

## 2015-04-14 DIAGNOSIS — Z8249 Family history of ischemic heart disease and other diseases of the circulatory system: Secondary | ICD-10-CM | POA: Diagnosis not present

## 2015-04-14 DIAGNOSIS — T2691XA Corrosion of right eye and adnexa, part unspecified, initial encounter: Secondary | ICD-10-CM | POA: Diagnosis not present

## 2015-04-14 DIAGNOSIS — T2692XA Corrosion of left eye and adnexa, part unspecified, initial encounter: Secondary | ICD-10-CM | POA: Diagnosis not present

## 2015-04-14 DIAGNOSIS — Z79899 Other long term (current) drug therapy: Secondary | ICD-10-CM | POA: Diagnosis not present

## 2015-04-16 ENCOUNTER — Encounter: Payer: Self-pay | Admitting: Adult Health

## 2015-04-16 ENCOUNTER — Ambulatory Visit (INDEPENDENT_AMBULATORY_CARE_PROVIDER_SITE_OTHER): Payer: Medicare Other | Admitting: Adult Health

## 2015-04-16 ENCOUNTER — Telehealth: Payer: Self-pay | Admitting: Neurology

## 2015-04-16 VITALS — BP 137/70 | HR 65 | Resp 20 | Ht 69.0 in | Wt 215.0 lb

## 2015-04-16 DIAGNOSIS — G7001 Myasthenia gravis with (acute) exacerbation: Secondary | ICD-10-CM | POA: Diagnosis not present

## 2015-04-16 DIAGNOSIS — S301XXA Contusion of abdominal wall, initial encounter: Secondary | ICD-10-CM | POA: Diagnosis not present

## 2015-04-16 MED ORDER — RANITIDINE HCL 75 MG PO TABS: 75.0000 mg | ORAL_TABLET | Freq: Two times a day (BID) | ORAL | Status: DC

## 2015-04-16 MED ORDER — PREDNISONE 10 MG PO TABS: 40.0000 mg | ORAL_TABLET | Freq: Every day | ORAL | Status: DC

## 2015-04-16 MED ORDER — PYRIDOSTIGMINE BROMIDE 60 MG PO TABS: 60.0000 mg | ORAL_TABLET | Freq: Four times a day (QID) | ORAL | Status: DC

## 2015-04-16 NOTE — Patient Instructions (Signed)
Increase Prednisone 40 mg (4 tablets) with breakfast Increase mestinon 60 mg four times a day Begin Zantac 75 mg twice a day We will call about IVIG

## 2015-04-16 NOTE — Progress Notes (Addendum)
PATIENT: Calvin Sloan DOB: November 18, 1933  REASON FOR VISIT: follow up- myasthenia gravis HISTORY FROM: patient  HISTORY OF PRESENT ILLNESS: Calvin Sloan is a 81 year old male with a history of myasthenia gravis. He returns today complaining of some difficulty swallowing. He states that he has been working outside all day. He states that he feels that his swallowing may have gotten slightly worse over the last month and a half. He does have a history of mild dysphagia. Denies any shortness of breath or difficulty breathing but states that sometimes he will lose his voice when trying to talk. He states that his voice has been very raspy. He does report some blurry vision. States that he will get diplopia when looking to the right. Denies any significant weakness in extremities. He states that he has to swallow very slowly. He does not eat things that require a lot of chewing. He is currently on Mestinon 60 mg 3 times a day and prednisone 2.5 mg daily.  HISTORY 02/18/15 [name]: Calvin Sloan is a 49 -year-old right-handed white married male with generalized seropositive myasthenia gravis characterized by bulbar weakness, shortness of breath , and dysphagia. Patient of [name]  He was diagnosed in 10/2002 with a positive Tensilon test, positive acetylchoine receptor antibody,and had a positive ANA at 1-160 speckled pattern. Initially he had right eye ptosis and subsequently, double vision, fatigue with chewing, dysphagia, and upper and lower extremity weakness. He was initially treated with Mestinon bromide in June 2004, which was associated with GI side effects.  Prednisone was started in 03/29/2003 and CellCept 05/2003. He developed increased jitteriness, rash, and cramps on CellCept which was discontinued 10/2003. He had progressive bulbar weakness and was admitted 12/2003 for IV IgG therapy which required a PEG tube for dysphagia transiently. He was started on Imuran. 01/17/2004 he was readmitted for 2  days of IV IgG because of shortness of breath. He has not been hospitalized since 01/2004.   He has been slowly tapered off prednisone 09/2007 and was on Imuran and Mestinon bromide.   He had B12 deficiency and was receiving B12 shots ,but B12 levels off injections have been normal and B12 shots were discontinued.  He has a history of vague visual loss on the left of unknown etiology. He denies swallowing problems, speech problems, voice changes, focal weakness, or double vision.  He is independent in activities of daily living and takes one nap per day.He denies memory loss but his wife states that he does have memory loss. His last blood studies in my office 05/27/10 were normal except a low white blood cell count of 3900.  His dermatologist [name] was concerned that the Imuran is associated with increased risk of skin cancer and the imuran was discontinued in 12/30/2010. His skin cancer (basal cell cancer) has improved since his imuran was stopped.  He has noted no change In myasthenic symptoms. His skin has improved. His medication for myasthenia is mesinon bromide 60 milligrams one and one half tablets 4 times a day. He has shortness of breath since his pulmonary emboli in 2003, also complains of excessive fatigue.   He is allergic to aspirin, GI side effect, gastric ulcer, and is on Plavix for a history of TIAs with right arm weakness He has swallowing problems at the beginning of the meal with liquids that improves as the meal continues.   He had right CTS release in May 2014 by [name].  He complains of shortness of  breath with walking, bending over to tire his shoed, due to PE 2003, pulmnolgist folllowed up, he does not like to chew on tough beef, he denies swallowing difficulty, no double vision, no droopy eyelid, no significant bleeding muscle weakness. He is active in yard, shop, threadmill   UPDATE Jan 16th 2015: He is now tapering off Mestinon, there was no  significant change in his functional status, he denies double vision, no ptosis, continue complains of shortness of breath with exertion no significant and muscle weakness, he has to wash down his food sometimes, but no significant swelling, or chewing difficulty, no dysarthria  UPDATE July 16th 2015: He is no longer mestinon, he does complains of increased breathing difficulty from prolonged walking, no chewing difficulty, no double vision, no ptosis.  Laboratory continue to demonstrate positive acetylcholine binding, and modulating antibodies. He did reported that his skin cancer has improved after stopping imuran, could not tolerate cellcept per record. Previously he responded to IVIG very well.  UPDATE Sept 25th 2015: He was stated on prednisone 10mg  qday, and mestinon 60mg  tid in June 2015, which did help his fatigue, shortness of breath, wife reported 50% improvement, he has increased appetite  But he was admitted to [facility] hospital in Sept 17th for bilateral PEs, right leg DVT, he is started on coumadin, bridge with Lovenox, [name] is monitoring his INR  He denies double vision, no ptosis, no limb muscle weakness, no dysphagia  UPDATE April 4th 2016: He is on coumadin for PE, INR was monitored by [name], his myasthenia gravis is under good control, is taking prednisone 10 mg every day, he has surgery in Nov 2015 for right leg cellulitis.  He only does little around his house, he has no double vision, SOB due to previous PE,   UPDATE Feb 18 2015: Previous laboratory evaluation showed mild B12 deficiency with level of 204, He did receive IM B12 supplement at his primary care Dr. [name] office for a while, now with normal repeat laboratory evaluation, injection has stopped He has mild choking difficulty, this has been going on since August 2016, not getting worse, he has more trouble with liquid. He complains of shortness of breath when bending over. He denies significant  gait difficulty, no breathing difficulty otherwise, he denies double vision, no ptosis,, No chewing difficulties   REVIEW OF SYSTEMS: Out of a complete 14 system review of symptoms, the patient complains only of the following symptoms, and all other reviewed systems are negative.  Hearing loss, double vision, blurred vision, memory loss, weakness  ALLERGIES: Allergies  Allergen Reactions  . Aspirin   . Shellfish Allergy     HOME MEDICATIONS: Outpatient Prescriptions Prior to Visit  Medication Sig Dispense Refill  . predniSONE (DELTASONE) 5 MG tablet Take 2 tablets (10 mg total) by mouth daily with breakfast. 30 tablet 11  . pyridostigmine (MESTINON) 60 MG tablet TAKE 1 TABLET THREE TIMES DAILY 270 tablet 2  . valsartan-hydrochlorothiazide (DIOVAN-HCT) 320-25 MG per tablet Take 1 tablet by mouth daily.    [name] warfarin (COUMADIN) 5 MG tablet 5 mg daily.     No facility-administered medications prior to visit.    PAST MEDICAL HISTORY: Past Medical History  Diagnosis Date  . Hypertension   . Pulmonary emboli ([place])     in Nov 2003  . B12 deficiency   . Essential tremor   . Skin cancer   . Myasthenia gravis ([place])     since 2004  . Memory deficit  PAST SURGICAL HISTORY: Past Surgical History  Procedure Laterality Date  . Foot surgery  1966    lt orif foot  . Skin cancer excision    . Cholecystectomy  2012    [facility]  . Colonoscopy    . Carpal tunnel release Right 09/14/2012    Procedure: CARPAL TUNNEL RELEASE;  Surgeon: Cammie Sickle., MD;  Location: [facility];  Service: Orthopedics;  Laterality: Right;    FAMILY HISTORY: Family History  Problem Relation Age of Onset  . Heart disease Mother   . Heart disease Father     SOCIAL HISTORY: Social History   Social History  . Marital Status: Married    Spouse Name: [name]  . Number of Children: 2  . Years of Education: 12   Occupational History  . Plant     Retired   Social History Main  Topics  . Smoking status: Former Smoker    Quit date: 05/17/1968  . Smokeless tobacco: Never Used  . Alcohol Use: No  . Drug Use: No  . Sexual Activity: Not on file   Other Topics Concern  . Not on file   Social History Narrative   He lives with his wife [name](, has 2 children, retired. Hard of hearing.   Right handed.   Caffeine- None      PHYSICAL EXAM  Filed Vitals:   04/16/15 1652  BP: 137/70  Pulse: 65  Resp: 20  Height: 5\' 9"  (1.753 m)  Weight: 215 lb (97.523 kg)   Body mass index is 31.74 kg/(m^2).  Generalized: Well developed, in no acute distress   Neurological examination  Mentation: Alert oriented to time, place, history taking. Follows all commands speech and language fluent Cranial nerve II-XII: Pupils were equal round reactive to light. Extraocular movements were full, visual field were full on confrontational test. Ptosis noted when looking to the right. Has moderate eye closure. Cheek puff weakness noted. Uvula tongue midline. Head turning and shoulder shrug  were normal and symmetric. Motor: Weakness in the proximal upper and lower extremities in the hip flexors. Good strength in the distal muscles. Sensory: Sensory testing is intact to soft touch on all 4 extremities. No evidence of extinction is noted.  Coordination: Cerebellar testing reveals good finger-nose-finger and heel-to-shin bilaterally.  Gait and station: Wide-based gait. No difficulty with walking on the toes of the heels. Reflexes: Deep tendon reflexes are symmetric and normal bilaterally.   DIAGNOSTIC DATA (LABS, IMAGING, TESTING) - I reviewed patient records, labs, notes, testing and imaging myself where available.   ASSESSMENT AND PLAN 81 y.o. year old male  has a past medical history of Hypertension; Pulmonary emboli ([place]); B12 deficiency; Essential tremor; Skin cancer; Myasthenia gravis ([place]); and Memory deficit. here with:  1. Myasthenia gravis  I consulted with Dr. [name] who also  completed and examine the patient. The patient does have bulbar weakness in proximal muscle weakness. We will order IVIG for 5 days. The patient will increase his prednisone 40 mg daily. We will order Zantac 75 mg twice a day. He will also increase Mestinon to 60 mg 4 times a day day. Patient is aware that if he begins having difficulty breathing he should go to the emergency room. Patient advised that if his symptoms worsen or he develops any new symptoms he should let us know. He will keep his follow-up appointment in January with Dr. [name].   Calvin Givens, MSN, NP-C 04/16/2015, 4:46 PM [facility] [id number]  [address] [place], [place] [address] [phone]   Addendum: I personally examined the patient, he reported 50% weaker than his baseline, difficulty getting up from seated position, difficulty chewing, swallowing, shortness of breath, on examination, he was noted to have increased weakness compared to previous examination, moderate eye closure, cheek puff, mild neck flexion, bilateral shoulder abduction, bilateral hip flexion weakness,  He was not able to tolerate immunosuppression treatment due to skin cancer  1, start IVIG 0.4 mg/kg for 5 days= 2 g/kg 2 increase prednisone from 2.5 milligrams to 40 mg daily, Zantac as needed 3, may increase Mestinon 60 mg from 3 tablets to 4 tablets daily

## 2015-04-16 NOTE — Telephone Encounter (Signed)
Pt's wife called sts he is having trouble swallowing. She would like him to see Dr [name] tomorrow if possible, she is going out of town

## 2015-04-16 NOTE — Telephone Encounter (Signed)
Spoke to [place] - he is not in any breathing distress - discussed with Dr. [name] and he is being worked into her schedule today.

## 2015-04-17 NOTE — Addendum Note (Signed)
Addended by: Marcial Pacas on: 04/17/2015 09:33 AM   Modules accepted: Level of Service

## 2015-04-28 DIAGNOSIS — G7001 Myasthenia gravis with (acute) exacerbation: Secondary | ICD-10-CM | POA: Diagnosis not present

## 2015-04-29 DIAGNOSIS — G7001 Myasthenia gravis with (acute) exacerbation: Secondary | ICD-10-CM | POA: Diagnosis not present

## 2015-04-30 DIAGNOSIS — G7001 Myasthenia gravis with (acute) exacerbation: Secondary | ICD-10-CM | POA: Diagnosis not present

## 2015-05-01 DIAGNOSIS — G7001 Myasthenia gravis with (acute) exacerbation: Secondary | ICD-10-CM | POA: Diagnosis not present

## 2015-05-02 DIAGNOSIS — G7001 Myasthenia gravis with (acute) exacerbation: Secondary | ICD-10-CM | POA: Diagnosis not present

## 2015-05-14 DIAGNOSIS — I82401 Acute embolism and thrombosis of unspecified deep veins of right lower extremity: Secondary | ICD-10-CM | POA: Diagnosis not present

## 2015-05-22 ENCOUNTER — Encounter: Payer: Self-pay | Admitting: Neurology

## 2015-05-22 ENCOUNTER — Ambulatory Visit (INDEPENDENT_AMBULATORY_CARE_PROVIDER_SITE_OTHER): Payer: Medicare Other | Admitting: Neurology

## 2015-05-22 VITALS — BP 132/62 | HR 60 | Ht 69.0 in | Wt 217.0 lb

## 2015-05-22 DIAGNOSIS — R0602 Shortness of breath: Secondary | ICD-10-CM

## 2015-05-22 DIAGNOSIS — R1310 Dysphagia, unspecified: Secondary | ICD-10-CM

## 2015-05-22 DIAGNOSIS — C4490 Unspecified malignant neoplasm of skin, unspecified: Secondary | ICD-10-CM

## 2015-05-22 DIAGNOSIS — G7000 Myasthenia gravis without (acute) exacerbation: Secondary | ICD-10-CM

## 2015-05-22 NOTE — Progress Notes (Signed)
Chief Complaint  Patient presents with  . Myasthenia Gravis    He is here with his wife, Calvin Sloan.  He completed his IVIG the week prior to Christmas.  He has also increase his Mestinon, Prednisone and started taking Zantac.  Says he continues to have difficulty swallowing, hoarseness of his voice and weakness.      PATIENT: Calvin Sloan DOB: [birth year]  REASON FOR VISIT: follow up- myasthenia gravis HISTORY FROM: patient  HISTORY OF PRESENT ILLNESS HISTORY 02/18/15 Calvin Sloan): Calvin Sloan is a 82 -year-old right-handed white married male with generalized seropositive myasthenia gravis characterized by bulbar weakness, shortness of breath , and dysphagia. Patient of Calvin Sloan  He was diagnosed in 10/2002 with a positive Tensilon test, positive acetylchoine receptor antibody,and had a positive ANA at 1-160 speckled pattern. Initially he had right eye ptosis and subsequently, double vision, fatigue with chewing, dysphagia, and upper and lower extremity weakness. He was initially treated with Mestinon bromide in June 2004, which was associated with GI side effects.  Prednisone was started in 03/29/2003 and CellCept 05/2003. He developed increased jitteriness, rash, and cramps on CellCept which was discontinued 10/2003. He had progressive bulbar weakness and was admitted 12/2003 for IV IgG therapy which required a PEG tube for dysphagia transiently. He was started on Imuran. 01/17/2004 he was readmitted for 2 days of IV IgG because of shortness of breath. He has not been hospitalized since 01/2004.   He has been slowly tapered off prednisone 09/2007 and was on Imuran and Mestinon bromide.   He had B12 deficiency and was receiving B12 shots ,but B12 levels off injections have been normal and B12 shots were discontinued.  He has a history of vague visual loss on the left of unknown etiology. He denies swallowing problems, speech problems, voice changes, focal weakness, or double vision.  He is  independent in activities of daily living and takes one nap per day.He denies memory loss but his wife states that he does have memory loss. His last blood studies in my office 05/27/10 were normal except a low white blood cell count of 3900.  His dermatologist Calvin Sloan was concerned that the Imuran is associated with increased risk of skin cancer and the imuran was discontinued in 12/30/2010. His skin cancer (basal cell cancer) has improved since his imuran was stopped.  He has noted no change In myasthenic symptoms. His skin has improved. His medication for myasthenia is mesinon bromide 60 milligrams one and one half tablets 4 times a day. He has shortness of breath since his pulmonary emboli in 2003, also complains of excessive fatigue.   He is allergic to aspirin, GI side effect, gastric ulcer, and is on Plavix for a history of TIAs with right arm weakness He has swallowing problems at the beginning of the meal with liquids that improves as the meal continues.   He had right CTS release in May 2014 by Calvin Sloan.  He complains of shortness of breath with walking, bending over to tire his shoed, due to PE 2003, pulmnolgist folllowed up, he does not like to chew on tough beef, he denies swallowing difficulty, no double vision, no droopy eyelid, no significant bleeding muscle weakness. He is active in yard, shop, threadmill   UPDATE Jan 16th 2015: He is now tapering off Mestinon, there was no significant change in his functional status, he denies double vision, no ptosis, continue complains of shortness of breath with exertion no significant and muscle weakness, he has  to wash down his food sometimes, but no significant swelling, or chewing difficulty, no dysarthria  UPDATE July 16th 2015: He is no longer mestinon, he does complains of increased breathing difficulty from prolonged walking, no chewing difficulty, no double vision, no ptosis.  Laboratory continue to demonstrate positive  acetylcholine binding, and modulating antibodies. He did reported that his skin cancer has improved after stopping imuran, could not tolerate cellcept per record. Previously he responded to IVIG very well.  UPDATE Sept 25th 2015: He was stated on prednisone 10mg  qday, and mestinon 60mg  tid in June 2015, which did help his fatigue, shortness of breath, wife reported 50% improvement, he has increased appetite  But he was admitted to [facility] in Sept 17th for bilateral PEs, right leg DVT, he is started on coumadin, bridge with Lovenox, Calvin Sloan is monitoring his INR  He denies double vision, no ptosis, no limb muscle weakness, no dysphagia  UPDATE April 4th 2016: He is on coumadin for PE, INR was monitored by Calvin Sloan, his myasthenia gravis is under good control, is taking prednisone 10 mg every day, he has surgery in Nov 2015 for right leg cellulitis.  He only does little around his house, he has no double vision, SOB due to previous PE,   UPDATE Feb 18 2015: Previous laboratory evaluation showed mild B12 deficiency with level of 204, He did receive IM B12 supplement at his primary care Calvin Sloan office for a while, now with normal repeat laboratory evaluation, injection has stopped He has mild choking difficulty, this has been going on since Sloan 2016, not getting worse, he has more trouble with liquid. He complains of shortness of breath when bending over. He denies significant gait difficulty, no breathing difficulty otherwise, he denies double vision, no ptosis,, No chewing difficulties   UPDATE May 22 2015: He came in last visit April 16 2015 complains of worsening blurry vision, double vision, swallowing difficulties, trouble talking, he reported 50% weakness compared to baseline, difficulty getting up from seated position, difficulty chewing, worsening shortness of breath, on examination, he was noted to have increased weakness compared to previous examination, he had  moderate eye-closure cheek puff, mild neck flexion weakness, mild bilateral shoulder abduction bilateral hip flexion weakness.  He was treated with IVIG 400 mg/kg every day for 5 days from December 11 to May 02 2015, his vision has much improved, he can walk better, but he continue have swallowing difficulty, dysarthria, he is also taking prednisone 40 mg every day, increase his Mestinon 60 mg to 3-4 tablets daily  He was not able to tolerate immunosuppression treatment in the past due to skin cancer  REVIEW OF SYSTEMS: Out of a complete 14 system review of symptoms, the patient complains only of the following symptoms, and all other reviewed systems are negative.  Hearing loss, ringing ears, trouble swallowing, drooling, shortness of breath, choking, muscle cramps  ALLERGIES: Allergies  Allergen Reactions  . Aspirin   . Shellfish Allergy     HOME MEDICATIONS: Outpatient Prescriptions Prior to Visit  Medication Sig Dispense Refill  . predniSONE (DELTASONE) 10 MG tablet Take 4 tablets (40 mg total) by mouth daily with breakfast. 120 tablet 3  . pyridostigmine (MESTINON) 60 MG tablet Take 1 tablet (60 mg total) by mouth 4 (four) times daily. 120 tablet 3  . ranitidine (ZANTAC 75) 75 MG tablet Take 1 tablet (75 mg total) by mouth 2 (two) times daily. 60 tablet 3  . valsartan-hydrochlorothiazide (DIOVAN-HCT) 320-25 MG per  tablet Take 1 tablet by mouth daily.    [name] warfarin (COUMADIN) 5 MG tablet 5 mg daily.     No facility-administered medications prior to visit.    PAST MEDICAL HISTORY: Past Medical History  Diagnosis Date  . Hypertension   . Pulmonary emboli ([place])     in Nov 2003  . B12 deficiency   . Essential tremor   . Skin cancer   . Myasthenia gravis ([place])     since 2004  . Memory deficit     PAST SURGICAL HISTORY: Past Surgical History  Procedure Laterality Date  . Foot surgery  1966    lt orif foot  . Skin cancer excision    . Cholecystectomy  2012     [facility]  . Colonoscopy    . Carpal tunnel release Right 09/14/2012    Procedure: CARPAL TUNNEL RELEASE;  Surgeon: Cammie Sickle., MD;  Location: [facility];  Service: Orthopedics;  Laterality: Right;    FAMILY HISTORY: Family History  Problem Relation Age of Onset  . Heart disease Mother   . Heart disease Father     SOCIAL HISTORY: Social History   Social History  . Marital Status: Married    Spouse Name: Calvin Sloan  . Number of Children: 2  . Years of Education: 12   Occupational History  . Plant     Retired   Social History Main Topics  . Smoking status: Former Smoker    Quit date: 05/17/1968  . Smokeless tobacco: Never Used  . Alcohol Use: No  . Drug Use: No  . Sexual Activity: Not on file   Other Topics Concern  . Not on file   Social History Narrative   He lives with his wife Calvin Sloan(, has 2 children, retired. Hard of hearing.   Right handed.   Caffeine- None      PHYSICAL EXAM  Filed Vitals:   05/22/15 1157  Height: 5\' 9"  (1.753 m)  Weight: 217 lb (98.431 kg)   Body mass index is 32.03 kg/(m^2).   PHYSICAL EXAMNIATION:  Gen: NAD, conversant, well nourised, obese, well groomed                     Cardiovascular: Regular rate rhythm, no peripheral edema, warm, nontender. Eyes: Conjunctivae clear without exudates or hemorrhage Neck: Supple, no carotid bruise. Pulmonary: Clear to auscultation bilaterally   NEUROLOGICAL EXAM:  MENTAL STATUS: Speech:    Speech is normal; fluent and spontaneous with normal comprehension.  Cognition:     Orientation to time, place and person     Normal recent and remote memory     Normal Attention span and concentration     Normal Language, naming, repeating,spontaneous speech     Fund of knowledge   CRANIAL NERVES: CN II: Visual fields are full to confrontation. Fundoscopic exam is normal with sharp discs and no vascular changes. Pupils are round equal and briskly reactive to light. CN III, IV,  VI: extraocular movement are normal. No ptosis. CN V: Facial sensation is intact to pinprick in all 3 divisions bilaterally. Corneal responses are intact.  CN VII: He has moderate eye closure, cheek puff weakness. CN VIII: Hearing is normal to rubbing fingers CN IX, X: Palate elevates symmetrically. Phonation is normal. CN XI: Head turning and shoulder shrug are intact CN XII: Tongue is midline with normal movements and no atrophy.  MOTOR: He has mild neck flexion, shoulder abduction, hip flexion weakness  REFLEXES: Reflexes  are 2+ and symmetric at the biceps, triceps, knees, and ankles. Plantar responses are flexor.  SENSORY: Intact to light touch, pinprick, position sense, and vibration sense are intact in fingers and toes.  COORDINATION: Rapid alternating movements and fine finger movements are intact. There is no dysmetria on finger-to-nose and heel-knee-shin.    GAIT/STANCE: He is able to get up from seated position and crossed. Gait is steady with normal steps, base, arm swing, and turning. Heel and toe walking are normal. Tandem gait is normal.  Romberg is absent.  DIAGNOSTIC DATA (LABS, IMAGING, TESTING) - I reviewed patient records, labs, notes, testing and imaging myself where available.   ASSESSMENT AND PLAN 82 y.o. year old male   Myasthenia gravis  Worsening weakness includes swallowing difficulty, shortness of breath,  Likely related to myasthenia gravis exacerbation, deconditioning might play a role as well  Continue prednisone 10 mg, 3 tablets each day, tapering down at 10 mg decrement to 10 mg daily  Mestinon 60 mg 3-4 tablets daily   Shortness of breath  Refer him to pulmonary function test  History of PE, will also refer him to pulmonologist for evaluations   Dysphagia  Refer him for swallowing study    Marcial Pacas, M.D. Ph.D.  [facility] [address] [place], [place] [address] [phone]

## 2015-05-23 ENCOUNTER — Telehealth: Payer: Self-pay | Admitting: Neurology

## 2015-05-23 NOTE — Telephone Encounter (Signed)
[facility] [phone] called to advise on the order for PFT, Dr. [name] checked everything and [facility] states they can't do everything. Please call to clarify.

## 2015-05-27 NOTE — Telephone Encounter (Signed)
I have called and left message

## 2015-05-27 NOTE — Addendum Note (Signed)
Addended by: Marcial Pacas on: 05/27/2015 10:17 AM   Modules accepted: Orders

## 2015-05-28 ENCOUNTER — Telehealth: Payer: Self-pay | Admitting: Neurology

## 2015-05-28 NOTE — Telephone Encounter (Signed)
Patient's wife is calling. The patient was to be scheduled at [facility] for a Pulmonary Function Test but was told that [facility] is renovating and the test would have to be scheduled elsewhere.  The patient wants to know if an order can be placed to have the test done at [facility]. Please call to advise. Thank you.

## 2015-06-02 NOTE — Addendum Note (Signed)
Addended by: Lester Wilson A on: 06/02/2015 02:24 PM   Modules accepted: Orders

## 2015-06-02 NOTE — Telephone Encounter (Signed)
[facility] and patient's wife he will have swallow study and pulmonary test done at [facility].

## 2015-06-03 NOTE — Telephone Encounter (Signed)
Pt's wife called said swallowing test has not been referred to [facility]. Please call at [phone]

## 2015-06-03 NOTE — Telephone Encounter (Signed)
Called and spoke to patient wife he will be having both test done this Thursday and Friday at [facility] test Friday Pulmonary test Thursday wife aware of all details.

## 2015-06-04 ENCOUNTER — Encounter (HOSPITAL_COMMUNITY): Payer: PRIVATE HEALTH INSURANCE

## 2015-06-04 DIAGNOSIS — K649 Unspecified hemorrhoids: Secondary | ICD-10-CM | POA: Diagnosis not present

## 2015-06-05 DIAGNOSIS — R0602 Shortness of breath: Secondary | ICD-10-CM | POA: Diagnosis not present

## 2015-06-05 DIAGNOSIS — C4490 Unspecified malignant neoplasm of skin, unspecified: Secondary | ICD-10-CM | POA: Diagnosis not present

## 2015-06-05 DIAGNOSIS — R1310 Dysphagia, unspecified: Secondary | ICD-10-CM | POA: Diagnosis not present

## 2015-06-05 DIAGNOSIS — G7000 Myasthenia gravis without (acute) exacerbation: Secondary | ICD-10-CM | POA: Diagnosis not present

## 2015-06-05 LAB — PULMONARY FUNCTION TEST

## 2015-06-06 DIAGNOSIS — R1310 Dysphagia, unspecified: Secondary | ICD-10-CM | POA: Diagnosis not present

## 2015-06-12 ENCOUNTER — Telehealth: Payer: Self-pay | Admitting: Neurology

## 2015-06-12 NOTE — Telephone Encounter (Signed)
I have called his wife, pulmonary function test in June 05 2015,  1, FVC is 3.1 5, 87%, this is normal 2. 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 Flow volume loop is unremarkable 4, lung volumes indicate some evidence of air trapping with an increased residual volume, the TLC is normal, the increased residual volume confirms the presence of airflow obstruction  5, DLCO is 19.0, 90%, this is normal 6, elderly resistant 3.7 2, 265%, this is increased  Wife reported, he recently had swallowing study, there was no significant abnormality found, his swallowing, breathing, symptoms overall has improved

## 2015-06-19 ENCOUNTER — Other Ambulatory Visit: Payer: Self-pay | Admitting: Adult Health

## 2015-06-25 ENCOUNTER — Encounter: Payer: Self-pay | Admitting: Neurology

## 2015-06-25 DIAGNOSIS — I82401 Acute embolism and thrombosis of unspecified deep veins of right lower extremity: Secondary | ICD-10-CM | POA: Diagnosis not present

## 2015-07-07 ENCOUNTER — Encounter: Payer: Self-pay | Admitting: Neurology

## 2015-07-07 ENCOUNTER — Ambulatory Visit (INDEPENDENT_AMBULATORY_CARE_PROVIDER_SITE_OTHER): Payer: Medicare Other | Admitting: Neurology

## 2015-07-07 VITALS — BP 132/71 | HR 79 | Ht 69.0 in | Wt 222.0 lb

## 2015-07-07 DIAGNOSIS — G7000 Myasthenia gravis without (acute) exacerbation: Secondary | ICD-10-CM | POA: Diagnosis not present

## 2015-07-07 DIAGNOSIS — C4490 Unspecified malignant neoplasm of skin, unspecified: Secondary | ICD-10-CM | POA: Diagnosis not present

## 2015-07-07 MED ORDER — RANITIDINE HCL 75 MG PO TABS: 75.0000 mg | ORAL_TABLET | Freq: Two times a day (BID) | ORAL | Status: DC

## 2015-07-07 NOTE — Progress Notes (Signed)
Chief Complaint  Patient presents with  . Myasthenia Gravis    Calvin Sloan is here with Calvin Sloan, [name].  Says Calvin swallowing has improved.  Calvin Sloan is still having problems with shortness of breath.        PATIENT: Calvin Sloan DOB: 1934-01-03  REASON FOR VISIT: follow up- myasthenia gravis HISTORY FROM: patient  HISTORY OF PRESENT ILLNESS HISTORY 02/18/15 Calvin Sloan): Calvin Sloan is a 82 -year-old right-handed white married male with generalized seropositive myasthenia gravis characterized by bulbar weakness, shortness of breath , and dysphagia. Patient of [name]  Calvin Sloan was diagnosed in 10/2002 with a positive Tensilon test, positive acetylchoine receptor antibody,and had a positive ANA at 1-160 speckled pattern. Initially Calvin Sloan had right eye ptosis and subsequently, double vision, fatigue with chewing, dysphagia, and upper and lower extremity weakness. Calvin Sloan was initially treated with Mestinon bromide in June 2004, which was associated with GI side effects.  Prednisone was started in 03/29/2003 and CellCept 05/2003. Calvin Sloan developed increased jitteriness, rash, and cramps on CellCept which was discontinued 10/2003. Calvin Sloan had progressive bulbar weakness and was admitted 12/2003 for IV IgG therapy which required a PEG tube for dysphagia transiently. Calvin Sloan was started on Imuran. 01/17/2004 Calvin Sloan was readmitted for 2 days of IV IgG because of shortness of breath. Calvin Sloan has not been hospitalized since 01/2004.   Calvin Sloan has been slowly tapered off prednisone 09/2007 and was on Imuran and Mestinon bromide.   Calvin Sloan had B12 deficiency and was receiving B12 shots ,but B12 levels off injections have been normal and B12 shots were discontinued.  Calvin Sloan has a history of vague visual loss on the left of unknown etiology. Calvin Sloan denies swallowing problems, speech problems, voice changes, focal weakness, or double vision.  Calvin Sloan is independent in activities of daily living and takes one nap per day.Calvin Sloan denies memory loss but Calvin Sloan states that Calvin Sloan does have  memory loss. Calvin last blood studies in my office 05/27/10 were normal except a low white blood cell count of 3900.  Calvin dermatologist [name] was concerned that the Imuran is associated with increased risk of skin cancer and the imuran was discontinued in 12/30/2010. Calvin skin cancer (basal cell cancer) has improved since Calvin imuran was stopped.  Calvin Sloan has noted no change In myasthenic symptoms. Calvin skin has improved. Calvin medication for myasthenia is mesinon bromide 60 milligrams one and one half tablets 4 times a day. Calvin Sloan has shortness of breath since Calvin pulmonary emboli in 2003, also complains of excessive fatigue.   Calvin Sloan is allergic to aspirin, GI side effect, gastric ulcer, and is on Plavix for a history of TIAs with right arm weakness Calvin Sloan has swallowing problems at the beginning of the meal with liquids that improves as the meal continues.   Calvin Sloan had right CTS release in May 2014 by [name].  Calvin Sloan complains of shortness of breath with walking, bending over to tire Calvin shoed, due to PE 2003, pulmnolgist folllowed up, Calvin Sloan does not like to chew on tough beef, Calvin Sloan denies swallowing difficulty, no double vision, no droopy eyelid, no significant bleeding muscle weakness. Calvin Sloan is active in yard, shop, threadmill   UPDATE Jan 16th 2015: Calvin Sloan is now tapering off Mestinon, there was no significant change in Calvin functional status, Calvin Sloan denies double vision, no ptosis, continue complains of shortness of breath with exertion no significant and muscle weakness, Calvin Sloan has to wash down Calvin food sometimes, but no significant swelling, or chewing difficulty, no dysarthria  UPDATE July  16th 2015: Calvin Sloan is no longer mestinon, Calvin Sloan does complains of increased breathing difficulty from prolonged walking, no chewing difficulty, no double vision, no ptosis.  Laboratory continue to demonstrate positive acetylcholine binding, and modulating antibodies. Calvin Sloan did reported that Calvin skin cancer has improved after stopping imuran,  could not tolerate cellcept per record. Previously Calvin Sloan responded to IVIG very well.  UPDATE Sept 25th 2015: Calvin Sloan was stated on prednisone 10mg  qday, and mestinon 60mg  tid in June 2015, which did help Calvin fatigue, shortness of breath, Sloan reported 50% improvement, Calvin Sloan has increased appetite  But Calvin Sloan was admitted to [facility] hospital in Sept 17th for bilateral PEs, right leg DVT, Calvin Sloan is started on coumadin, bridge with Lovenox, [name] is monitoring Calvin INR  Calvin Sloan denies double vision, no ptosis, no limb muscle weakness, no dysphagia  UPDATE April 4th 2016: Calvin Sloan is on coumadin for PE, INR was monitored by [name], Calvin myasthenia gravis is under good control, is taking prednisone 10 mg every day, Calvin Sloan has surgery in Nov 2015 for right leg cellulitis.  Calvin Sloan only does little around Calvin house, Calvin Sloan has no double vision, SOB due to previous PE,   UPDATE Feb 18 2015: Previous laboratory evaluation showed mild B12 deficiency with level of 204, Calvin Sloan did receive IM B12 supplement at Calvin primary care Dr. [name] office for a while, now with normal repeat laboratory evaluation, injection has stopped Calvin Sloan has mild choking difficulty, this has been going on since August 2016, not getting worse, Calvin Sloan has more trouble with liquid. Calvin Sloan complains of shortness of breath when bending over. Calvin Sloan denies significant gait difficulty, no breathing difficulty otherwise, Calvin Sloan denies double vision, no ptosis,, No chewing difficulties   UPDATE May 22 2015: Calvin Sloan came in last visit April 16 2015 complains of worsening blurry vision, double vision, swallowing difficulties, trouble talking, Calvin Sloan reported 50% weakness compared to baseline, difficulty getting up from seated position, difficulty chewing, worsening shortness of breath, on examination, Calvin Sloan was noted to have increased weakness compared to previous examination, Calvin Sloan had moderate eye-closure cheek puff, mild neck flexion weakness, mild bilateral shoulder abduction bilateral hip flexion  weakness.  Calvin Sloan was treated with IVIG 400 mg/kg every day for 5 days from December 11 to May 02 2015, Calvin vision has much improved, Calvin Sloan can walk better, but Calvin Sloan continue have swallowing difficulty, dysarthria, Calvin Sloan is also taking prednisone 40 mg every day, increase Calvin Mestinon 60 mg to 3-4 tablets daily  Calvin Sloan was not able to tolerate immunosuppression treatment in the past due to skin cancer  UPDATE Jul 07 2015:  I have called Calvin Sloan, pulmonary function test in June 05 2015.  1, FVC is 3.1 5, 87%, this is normal 2. FEV1 is 2.1 6, 77%, with a percent FEV1 of 68, this indicated mild to moderate primary small  obstruction in this patient was a history of cigarette smoking, large elderly flow rates are generally normal  3. Flow volume loop is unremarkable 4, lung volumes indicate some evidence of air trapping with an increased residual volume, the TLC is normal, the increased residual volume confirms the presence of airflow obstruction  5, DLCO is 19.0, 90%, this is normal 6, elderly resistant 3.7 2, 265%, this is increased  Sloan reported, Calvin Sloan recently had swallowing study, there was no significant abnormality found, Calvin swallowing, breathing, symptoms overall has improved  Calvin Sloan did think that IVIG treatment in Dec 11-16th 2016 has helped Calvin blurry vision, it is gone, Calvin Sloan still feel straggled sometimes,  Calvin Sloan has shortness with exertion and bending over. Calvin talking is much better.  REVIEW OF SYSTEMS: Out of a complete 14 system review of symptoms, the patient complains only of the following symptoms, and all other reviewed systems are negative.  Hearing loss, ringing ears, trouble swallowing,shortness of breath,  muscle cramps  ALLERGIES: Allergies  Allergen Reactions  . Aspirin   . Shellfish Allergy     HOME MEDICATIONS: Outpatient Prescriptions Prior to Visit  Medication Sig Dispense Refill  . predniSONE (DELTASONE) 10 MG tablet Take 4 tablets (40 mg total) by mouth daily with  breakfast. 120 tablet 3  . pyridostigmine (MESTINON) 60 MG tablet Take 1 tablet (60 mg total) by mouth 4 (four) times daily. 120 tablet 3  . ranitidine (ZANTAC 75) 75 MG tablet Take 1 tablet (75 mg total) by mouth 2 (two) times daily. 60 tablet 3  . valsartan-hydrochlorothiazide (DIOVAN-HCT) 320-25 MG per tablet Take 1 tablet by mouth daily.    [name] warfarin (COUMADIN) 5 MG tablet 5 mg daily.     No facility-administered medications prior to visit.    PAST MEDICAL HISTORY: Past Medical History  Diagnosis Date  . Hypertension   . Pulmonary emboli ([place])     in Nov 2003  . B12 deficiency   . Essential tremor   . Skin cancer   . Myasthenia gravis ([place])     since 2004  . Memory deficit     PAST SURGICAL HISTORY: Past Surgical History  Procedure Laterality Date  . Foot surgery  1966    lt orif foot  . Skin cancer excision    . Cholecystectomy  2012    [place]  . Colonoscopy    . Carpal tunnel release Right 09/14/2012    Procedure: CARPAL TUNNEL RELEASE;  Surgeon: Cammie Sickle., MD;  Location: [facility];  Service: Orthopedics;  Laterality: Right;    FAMILY HISTORY: Family History  Problem Relation Age of Onset  . Heart disease Mother   . Heart disease Father     SOCIAL HISTORY: Social History   Social History  . Marital Status: Married    Spouse Name: [name]  . Number of Children: 2  . Years of Education: 12   Occupational History  . Plant     Retired   Social History Main Topics  . Smoking status: Former Smoker    Quit date: 05/17/1968  . Smokeless tobacco: Never Used  . Alcohol Use: No  . Drug Use: No  . Sexual Activity: Not on file   Other Topics Concern  . Not on file   Social History Narrative   Calvin Sloan lives with Calvin Sloan [name](, has 2 children, retired. Hard of hearing.   Right handed.   Caffeine- None      PHYSICAL EXAM  Filed Vitals:   07/07/15 1415  BP: 132/71  Pulse: 79  Height: 5\' 9"  (1.753 m)  Weight: 222 lb (100.699  kg)   Body mass index is 32.77 kg/(m^2).   PHYSICAL EXAMNIATION:  Gen: NAD, conversant, well nourised, obese, well groomed                     Cardiovascular: Regular rate rhythm, no peripheral edema, warm, nontender. Eyes: Conjunctivae clear without exudates or hemorrhage Neck: Supple, no carotid bruise. Pulmonary: Clear to auscultation bilaterally   NEUROLOGICAL EXAM:  MENTAL STATUS: Speech:    Speech is normal; fluent and spontaneous with normal comprehension.  Cognition:  Orientation to time, place and person     Normal recent and remote memory     Normal Attention span and concentration     Normal Language, naming, repeating,spontaneous speech     Fund of knowledge   CRANIAL NERVES: CN II: Visual fields are full to confrontation. Fundoscopic exam is normal with sharp discs and no vascular changes. Pupils are round equal and briskly reactive to light. CN III, IV, VI: extraocular movement are normal. No ptosis. CN V: Facial sensation is intact to pinprick in all 3 divisions bilaterally. Corneal responses are intact.  CN VII: Calvin Sloan has moderate eye closure, cheek puff weakness. CN VIII: Hearing is normal to rubbing fingers CN IX, X: Palate elevates symmetrically. Phonation is normal. CN XI: Head turning and shoulder shrug are intact CN XII: Tongue is midline with normal movements and no atrophy.  MOTOR: Calvin Sloan has no significant muscle weakness, normal neck flexion, shoulder abduction, hip flexion  REFLEXES: Reflexes are 2+ and symmetric at the biceps, triceps, knees, and ankles. Plantar responses are flexor.  SENSORY: Intact to light touch, pinprick, position sense, and vibration sense are intact in fingers and toes.  COORDINATION: Rapid alternating movements and fine finger movements are intact. There is no dysmetria on finger-to-nose and heel-knee-shin.    GAIT/STANCE: Calvin Sloan is able to get up from seated position and crossed. Gait is steady with normal steps, base, arm  swing, and turning. Heel and toe walking are normal. Tandem gait is normal.  Romberg is absent.  DIAGNOSTIC DATA (LABS, IMAGING, TESTING) - I reviewed patient records, labs, notes, testing and imaging myself where available.   ASSESSMENT AND PLAN 82 y.o. year old male   Myasthenia gravis  Worsening weakness includes swallowing difficulty, shortness of breath, has much improved with IVIG treatment in December  This is most consistent with myasthenia gravis exacerbation, deconditioning might play a role as well  Continue prednisone tapering decreased to 10 mg half tablet for 2 weeks and then stopped  Mestinon 60 mg 3-4 tablets daily   Shortness of breath  Refer him to pulmonary function test, showed evidence of small airway construction,  If Calvin Sloan continue complains of difficulty breathing, will refer him to pulmonologist   Dysphagia  Swallowing study showed no significant abnormalities    Marcial Pacas, M.D. Ph.D.  [facility] [address], [place], [place] [address] [phone]

## 2015-07-07 NOTE — Patient Instructions (Signed)
Take prednisone 10 mg  Half tablet every morning for 2 weeks  And then stop  Mestinon 60 mg 3-4 tablets each day

## 2015-07-15 DIAGNOSIS — M25531 Pain in right wrist: Secondary | ICD-10-CM | POA: Diagnosis not present

## 2015-07-15 DIAGNOSIS — L03115 Cellulitis of right lower limb: Secondary | ICD-10-CM | POA: Diagnosis not present

## 2015-07-15 DIAGNOSIS — L03116 Cellulitis of left lower limb: Secondary | ICD-10-CM | POA: Diagnosis not present

## 2015-07-18 DIAGNOSIS — L03115 Cellulitis of right lower limb: Secondary | ICD-10-CM | POA: Diagnosis not present

## 2015-07-18 DIAGNOSIS — L03116 Cellulitis of left lower limb: Secondary | ICD-10-CM | POA: Diagnosis not present

## 2015-07-22 DIAGNOSIS — L03116 Cellulitis of left lower limb: Secondary | ICD-10-CM | POA: Diagnosis not present

## 2015-07-25 DIAGNOSIS — H52223 Regular astigmatism, bilateral: Secondary | ICD-10-CM | POA: Diagnosis not present

## 2015-07-25 DIAGNOSIS — H524 Presbyopia: Secondary | ICD-10-CM | POA: Diagnosis not present

## 2015-07-25 DIAGNOSIS — H2513 Age-related nuclear cataract, bilateral: Secondary | ICD-10-CM | POA: Diagnosis not present

## 2015-07-25 DIAGNOSIS — H5203 Hypermetropia, bilateral: Secondary | ICD-10-CM | POA: Diagnosis not present

## 2015-07-28 DIAGNOSIS — L03116 Cellulitis of left lower limb: Secondary | ICD-10-CM | POA: Diagnosis not present

## 2015-07-31 DIAGNOSIS — I82401 Acute embolism and thrombosis of unspecified deep veins of right lower extremity: Secondary | ICD-10-CM | POA: Diagnosis not present

## 2015-07-31 DIAGNOSIS — I2699 Other pulmonary embolism without acute cor pulmonale: Secondary | ICD-10-CM | POA: Diagnosis not present

## 2015-08-04 DIAGNOSIS — I878 Other specified disorders of veins: Secondary | ICD-10-CM | POA: Diagnosis not present

## 2015-08-04 DIAGNOSIS — I872 Venous insufficiency (chronic) (peripheral): Secondary | ICD-10-CM | POA: Diagnosis not present

## 2015-08-06 DIAGNOSIS — I739 Peripheral vascular disease, unspecified: Secondary | ICD-10-CM | POA: Diagnosis not present

## 2015-08-06 DIAGNOSIS — M7989 Other specified soft tissue disorders: Secondary | ICD-10-CM | POA: Diagnosis not present

## 2015-08-06 DIAGNOSIS — Z86718 Personal history of other venous thrombosis and embolism: Secondary | ICD-10-CM | POA: Diagnosis not present

## 2015-08-11 DIAGNOSIS — I739 Peripheral vascular disease, unspecified: Secondary | ICD-10-CM | POA: Diagnosis not present

## 2015-08-11 DIAGNOSIS — L97929 Non-pressure chronic ulcer of unspecified part of left lower leg with unspecified severity: Secondary | ICD-10-CM | POA: Diagnosis not present

## 2015-08-11 DIAGNOSIS — E119 Type 2 diabetes mellitus without complications: Secondary | ICD-10-CM | POA: Diagnosis not present

## 2015-08-11 DIAGNOSIS — I10 Essential (primary) hypertension: Secondary | ICD-10-CM | POA: Diagnosis not present

## 2015-08-11 DIAGNOSIS — L97919 Non-pressure chronic ulcer of unspecified part of right lower leg with unspecified severity: Secondary | ICD-10-CM | POA: Diagnosis not present

## 2015-08-20 DIAGNOSIS — I878 Other specified disorders of veins: Secondary | ICD-10-CM | POA: Diagnosis not present

## 2015-09-11 DIAGNOSIS — I2699 Other pulmonary embolism without acute cor pulmonale: Secondary | ICD-10-CM | POA: Diagnosis not present

## 2015-09-17 DIAGNOSIS — G5602 Carpal tunnel syndrome, left upper limb: Secondary | ICD-10-CM | POA: Diagnosis not present

## 2015-09-26 ENCOUNTER — Other Ambulatory Visit: Payer: Self-pay | Admitting: Adult Health

## 2015-09-30 DIAGNOSIS — M792 Neuralgia and neuritis, unspecified: Secondary | ICD-10-CM | POA: Diagnosis not present

## 2015-09-30 DIAGNOSIS — M25532 Pain in left wrist: Secondary | ICD-10-CM | POA: Diagnosis not present

## 2015-10-06 ENCOUNTER — Encounter: Payer: Self-pay | Admitting: Neurology

## 2015-10-06 ENCOUNTER — Ambulatory Visit (INDEPENDENT_AMBULATORY_CARE_PROVIDER_SITE_OTHER): Payer: Medicare Other | Admitting: Neurology

## 2015-10-06 VITALS — BP 130/71 | HR 62 | Ht 69.0 in | Wt 226.5 lb

## 2015-10-06 DIAGNOSIS — G7000 Myasthenia gravis without (acute) exacerbation: Secondary | ICD-10-CM

## 2015-10-06 DIAGNOSIS — G5602 Carpal tunnel syndrome, left upper limb: Secondary | ICD-10-CM | POA: Diagnosis not present

## 2015-10-06 NOTE — Progress Notes (Signed)
Chief Complaint  Patient presents with  . Myasthenia Gravis    He is here with is wife, [name].  Says his shortness of breath is still about the same.  Feels his weakness is better.  He is now wearing compression stockings due to poor circulation.  . Numbness    He has been experiencing numbness/tingling in his left 1st, 2nd and 3rd fingers, in addition to intermittent left arm pain.  Denies neck pain.  He has recently been seen by Dr. Case who felt like he needs to have a EMG/NCV.       PATIENT: Calvin Sloan DOB: 31-Mar-1934  REASON FOR VISIT: follow up- myasthenia gravis HISTORY FROM: patient  HISTORY OF PRESENT ILLNESS HISTORY 02/18/15 [name]: KINSER BIESE is a 69 -year-old right-handed white married male with generalized seropositive myasthenia gravis characterized by bulbar weakness, shortness of breath , and dysphagia. Patient of [name]  He was diagnosed in 10/2002 with a positive Tensilon test, positive acetylchoine receptor antibody,and had a positive ANA at 1-160 speckled pattern. Initially he had right eye ptosis and subsequently, double vision, fatigue with chewing, dysphagia, and upper and lower extremity weakness. He was initially treated with Mestinon bromide in June 2004, which was associated with GI side effects.  Prednisone was started in 03/29/2003 and CellCept 05/2003. He developed increased jitteriness, rash, and cramps on CellCept which was discontinued 10/2003. He had progressive bulbar weakness and was admitted 12/2003 for IV IgG therapy which required a PEG tube for dysphagia transiently. He was started on Imuran. 01/17/2004 he was readmitted for 2 days of IV IgG because of shortness of breath. He has not been hospitalized since 01/2004.   He has been slowly tapered off prednisone 09/2007 and was on Imuran and Mestinon bromide.   He had B12 deficiency and was receiving B12 shots ,but B12 levels off injections have been normal and B12 shots were discontinued.  He  has a history of vague visual loss on the left of unknown etiology. He denies swallowing problems, speech problems, voice changes, focal weakness, or double vision.  He is independent in activities of daily living and takes one nap per day.He denies memory loss but his wife states that he does have memory loss. His last blood studies in my office 05/27/10 were normal except a low white blood cell count of 3900.  His dermatologist [name] was concerned that the Imuran is associated with increased risk of skin cancer and the imuran was discontinued in 12/30/2010. His skin cancer (basal cell cancer) has improved since his imuran was stopped.  He has noted no change In myasthenic symptoms. His skin has improved. His medication for myasthenia is mesinon bromide 60 milligrams one and one half tablets 4 times a day. He has shortness of breath since his pulmonary emboli in 2003, also complains of excessive fatigue.   He is allergic to aspirin, GI side effect, gastric ulcer, and is on Plavix for a history of TIAs with right arm weakness He has swallowing problems at the beginning of the meal with liquids that improves as the meal continues.   He had right CTS release in May 2014 by [name].  He complains of shortness of breath with walking, bending over to tire his shoed, due to PE 2003, pulmnolgist folllowed up, he does not like to chew on tough beef, he denies swallowing difficulty, no double vision, no droopy eyelid, no significant bleeding muscle weakness. He is active in yard, shop, threadmill  UPDATE Jan 16th 2015: He is now tapering off Mestinon, there was no significant change in his functional status, he denies double vision, no ptosis, continue complains of shortness of breath with exertion no significant and muscle weakness, he has to wash down his food sometimes, but no significant swelling, or chewing difficulty, no dysarthria  UPDATE July 16th 2015: He is no longer mestinon,  he does complains of increased breathing difficulty from prolonged walking, no chewing difficulty, no double vision, no ptosis.  Laboratory continue to demonstrate positive acetylcholine binding, and modulating antibodies. He did reported that his skin cancer has improved after stopping imuran, could not tolerate cellcept per record. Previously he responded to IVIG very well.  UPDATE Sept 25th 2015: He was stated on prednisone 10mg  qday, and mestinon 60mg  tid in June 2015, which did help his fatigue, shortness of breath, wife reported 50% improvement, he has increased appetite  But he was admitted to [facility] hospital in Sept 17th for bilateral PEs, right leg DVT, he is started on coumadin, bridge with Lovenox, [name] is monitoring his INR  He denies double vision, no ptosis, no limb muscle weakness, no dysphagia  UPDATE April 4th 2016: He is on coumadin for PE, INR was monitored by [name], his myasthenia gravis is under good control, is taking prednisone 10 mg every day, he has surgery in Nov 2015 for right leg cellulitis.  He only does little around his house, he has no double vision, SOB due to previous PE,   UPDATE Feb 18 2015: Previous laboratory evaluation showed mild B12 deficiency with level of 204, He did receive IM B12 supplement at his primary care Dr. [name] office for a while, now with normal repeat laboratory evaluation, injection has stopped He has mild choking difficulty, this has been going on since August 2016, not getting worse, he has more trouble with liquid. He complains of shortness of breath when bending over. He denies significant gait difficulty, no breathing difficulty otherwise, he denies double vision, no ptosis,, No chewing difficulties   UPDATE May 22 2015: He came in last visit April 16 2015 complains of worsening blurry vision, double vision, swallowing difficulties, trouble talking, he reported 50% weakness compared to baseline, difficulty  getting up from seated position, difficulty chewing, worsening shortness of breath, on examination, he was noted to have increased weakness compared to previous examination, he had moderate eye-closure cheek puff, mild neck flexion weakness, mild bilateral shoulder abduction bilateral hip flexion weakness.  He was treated with IVIG 400 mg/kg every day for 5 days from December 11 to May 02 2015, his vision has much improved, he can walk better, but he continue have swallowing difficulty, dysarthria, he is also taking prednisone 40 mg every day, increase his Mestinon 60 mg to 3-4 tablets daily  He was not able to tolerate immunosuppression treatment in the past due to skin cancer  UPDATE Jul 07 2015:  I have called his wife, pulmonary function test in June 05 2015.  1, FVC is 3.1 5, 87%, this is normal 2. FEV1 is 2.1 6, 77%, with a percent FEV1 of 68, this indicated mild to moderate primary small  obstruction in this patient was a history of cigarette smoking, large elderly flow rates are generally normal  3. Flow volume loop is unremarkable 4, lung volumes indicate some evidence of air trapping with an increased residual volume, the TLC is normal, the increased residual volume confirms the presence of airflow obstruction  5, DLCO is 19.0,  90%, this is normal 6, elderly resistant 3.7 2, 265%, this is increased  Wife reported, he recently had swallowing study, there was no significant abnormality found, his swallowing, breathing, symptoms overall has improved  He did think that IVIG treatment in Dec 11-16th 2016 has helped his blurry vision, it is gone, he still feel straggled sometimes, he has shortness with exertion and bending over. His talking is much better.  UPDATE May 22nd 2017: He continue complains of shortness of breath with minimum exertion, no diplopia, no double vision, mild dysphasia,  He had a history of right carpal tunnel release surgery in the past, which has been  helpful, now complains of left first 3 finger paresthesia, EMG nerve conduction studies and that local hospital   REVIEW OF SYSTEMS: Out of a complete 14 system review of symptoms, the patient complains only of the following symptoms, and all other reviewed systems are negative.  As above  ALLERGIES: Allergies  Allergen Reactions  . Aspirin   . Shellfish Allergy     HOME MEDICATIONS: Outpatient Prescriptions Prior to Visit  Medication Sig Dispense Refill  . pyridostigmine (MESTINON) 60 MG tablet TAKE 1 TABLET FOUR TIMES DAILY 360 tablet 3  . ranitidine (ZANTAC 75) 75 MG tablet Take 1 tablet (75 mg total) by mouth 2 (two) times daily. 60 tablet 6  . valsartan-hydrochlorothiazide (DIOVAN-HCT) 320-25 MG per tablet Take 1 tablet by mouth daily.    [name] warfarin (COUMADIN) 5 MG tablet 5 mg. Taking 5mg  Mon, Wed, Fri.  Taking 2.5mg  Sun, Tues, Thurs, Sat.    . predniSONE (DELTASONE) 10 MG tablet Take 4 tablets (40 mg total) by mouth daily with breakfast. 120 tablet 3   No facility-administered medications prior to visit.    PAST MEDICAL HISTORY: Past Medical History  Diagnosis Date  . Hypertension   . Pulmonary emboli ([place])     in Nov 2003  . B12 deficiency   . Essential tremor   . Skin cancer   . Myasthenia gravis ([place])     since 2004  . Memory deficit     PAST SURGICAL HISTORY: Past Surgical History  Procedure Laterality Date  . Foot surgery  1966    lt orif foot  . Skin cancer excision    . Cholecystectomy  2012    [facility]  . Colonoscopy    . Carpal tunnel release Right 09/14/2012    Procedure: CARPAL TUNNEL RELEASE;  Surgeon: Cammie Sickle., MD;  Location: [place];  Service: Orthopedics;  Laterality: Right;    FAMILY HISTORY: Family History  Problem Relation Age of Onset  . Heart disease Mother   . Heart disease Father     SOCIAL HISTORY: Social History   Social History  . Marital Status: Married    Spouse Name: [name]  . Number of  Children: 2  . Years of Education: 12   Occupational History  . Plant     Retired   Social History Main Topics  . Smoking status: Former Smoker    Quit date: 05/17/1968  . Smokeless tobacco: Never Used  . Alcohol Use: No  . Drug Use: No  . Sexual Activity: Not on file   Other Topics Concern  . Not on file   Social History Narrative   He lives with his wife [name](, has 2 children, retired. Hard of hearing.   Right handed.   Caffeine- None      PHYSICAL EXAM  Filed Vitals:   10/06/15 1105  BP: 130/71  Pulse: 62  Height: 5\' 9"  (1.753 m)  Weight: 226 lb 8 oz (102.74 kg)   Body mass index is 33.43 kg/(m^2).   PHYSICAL EXAMNIATION:  Gen: NAD, conversant, well nourised, obese, well groomed                     Cardiovascular: Regular rate rhythm, no peripheral edema, warm, nontender. Eyes: Conjunctivae clear without exudates or hemorrhage Neck: Supple, no carotid bruise. Pulmonary: Clear to auscultation bilaterally   NEUROLOGICAL EXAM:  MENTAL STATUS: Speech:    Speech is normal; fluent and spontaneous with normal comprehension.  Cognition:     Orientation to time, place and person     Normal recent and remote memory     Normal Attention span and concentration     Normal Language, naming, repeating,spontaneous speech     Fund of knowledge   CRANIAL NERVES: CN II: Visual fields are full to confrontation. Fundoscopic exam is normal with sharp discs and no vascular changes. Pupils are round equal and briskly reactive to light. CN III, IV, VI: extraocular movement are normal. No ptosis. CN V: Facial sensation is intact to pinprick in all 3 divisions bilaterally. Corneal responses are intact.  CN VII: He has mild  eye closure, cheek puff weakness. CN VIII: Hearing is normal to rubbing fingers CN IX, X: Palate elevates symmetrically. Phonation is normal. CN XI: Head turning and shoulder shrug are intact CN XII: Tongue is midline with normal movements and no  atrophy.  MOTOR: He has no significant muscle weakness, normal neck flexion, shoulder abduction, hip flexion  REFLEXES: Reflexes are 2+ and symmetric at the biceps, triceps, knees, and ankles. Plantar responses are flexor.  SENSORY: Intact to light touch, pinprick, position sense, and vibration sense are intact in fingers and toes.  COORDINATION: Rapid alternating movements and fine finger movements are intact. There is no dysmetria on finger-to-nose and heel-knee-shin.    GAIT/STANCE: He is able to get up from seated position and crossed. Gait is steady with normal steps, base, arm swing, and turning. Heel and toe walking are normal. Tandem gait is normal.  Romberg is absent.  DIAGNOSTIC DATA (LABS, IMAGING, TESTING) - I reviewed patient records, labs, notes, testing and imaging myself where available.   ASSESSMENT AND PLAN 82 y.o. year old male   Myasthenia gravis  Worsening weakness includes swallowing difficulty, shortness of breath, has much improved with IVIG treatment in December  He has stopped prednisone,  decreased Mestinon to 60 mg 2-3 tablets every day   Shortness of breath  Refer him to pulmonary function test, showed evidence of small airway construction,  If he continue complains of difficulty breathing, will refer him to pulmonologist   Carpal tunnel syndromes       Marcial Pacas, M.D. Ph.D.  [facility] [address] [place], [place] [address] [phone]

## 2015-10-09 DIAGNOSIS — G5603 Carpal tunnel syndrome, bilateral upper limbs: Secondary | ICD-10-CM | POA: Diagnosis not present

## 2015-10-09 DIAGNOSIS — G7000 Myasthenia gravis without (acute) exacerbation: Secondary | ICD-10-CM | POA: Diagnosis not present

## 2015-10-09 DIAGNOSIS — R208 Other disturbances of skin sensation: Secondary | ICD-10-CM | POA: Diagnosis not present

## 2015-10-09 DIAGNOSIS — M79602 Pain in left arm: Secondary | ICD-10-CM | POA: Diagnosis not present

## 2015-10-16 DIAGNOSIS — G5601 Carpal tunnel syndrome, right upper limb: Secondary | ICD-10-CM | POA: Diagnosis not present

## 2015-10-16 DIAGNOSIS — G5602 Carpal tunnel syndrome, left upper limb: Secondary | ICD-10-CM | POA: Diagnosis not present

## 2015-10-16 DIAGNOSIS — G5603 Carpal tunnel syndrome, bilateral upper limbs: Secondary | ICD-10-CM | POA: Diagnosis not present

## 2015-10-16 DIAGNOSIS — G603 Idiopathic progressive neuropathy: Secondary | ICD-10-CM | POA: Diagnosis not present

## 2015-10-23 DIAGNOSIS — I2699 Other pulmonary embolism without acute cor pulmonale: Secondary | ICD-10-CM | POA: Diagnosis not present

## 2015-10-23 DIAGNOSIS — I82401 Acute embolism and thrombosis of unspecified deep veins of right lower extremity: Secondary | ICD-10-CM | POA: Diagnosis not present

## 2015-10-24 ENCOUNTER — Other Ambulatory Visit: Payer: Self-pay | Admitting: Orthopedic Surgery

## 2015-10-24 DIAGNOSIS — M1812 Unilateral primary osteoarthritis of first carpometacarpal joint, left hand: Secondary | ICD-10-CM | POA: Insufficient documentation

## 2015-10-24 DIAGNOSIS — G5602 Carpal tunnel syndrome, left upper limb: Secondary | ICD-10-CM | POA: Diagnosis not present

## 2015-10-24 DIAGNOSIS — M19042 Primary osteoarthritis, left hand: Secondary | ICD-10-CM | POA: Insufficient documentation

## 2015-10-28 ENCOUNTER — Encounter (HOSPITAL_BASED_OUTPATIENT_CLINIC_OR_DEPARTMENT_OTHER): Payer: Self-pay | Admitting: *Deleted

## 2015-10-30 ENCOUNTER — Encounter (HOSPITAL_COMMUNITY)
Admission: RE | Admit: 2015-10-30 | Discharge: 2015-10-30 | Disposition: A | Discharge status: Home / Self Care | Payer: Medicare Other | Source: Ambulatory Visit | Attending: Orthopedic Surgery | Admitting: Orthopedic Surgery

## 2015-10-30 DIAGNOSIS — Z01812 Encounter for preprocedural laboratory examination: Secondary | ICD-10-CM | POA: Insufficient documentation

## 2015-10-30 DIAGNOSIS — Z01810 Encounter for preprocedural cardiovascular examination: Secondary | ICD-10-CM | POA: Diagnosis not present

## 2015-10-30 LAB — BASIC METABOLIC PANEL
Anion gap: 7 (ref 5–15)
BUN: 14 mg/dL (ref 6–20)
CHLORIDE: 103 mmol/L (ref 101–111)
CO2: 25 mmol/L (ref 22–32)
CREATININE: 1.5 mg/dL — AB (ref 0.61–1.24)
Calcium: 9 mg/dL (ref 8.9–10.3)
GFR calc Af Amer: 49 mL/min — ABNORMAL LOW (ref >60)
GFR calc non Af Amer: 42 mL/min — ABNORMAL LOW (ref >60)
Glucose, Bld: 181 mg/dL — ABNORMAL HIGH (ref 65–99)
Potassium: 3.2 mmol/L — ABNORMAL LOW (ref 3.5–5.1)
SODIUM: 135 mmol/L (ref 135–145)

## 2015-10-31 ENCOUNTER — Other Ambulatory Visit (HOSPITAL_COMMUNITY): Payer: PRIVATE HEALTH INSURANCE

## 2015-10-31 NOTE — Progress Notes (Signed)
Abnormal labs given to dr Ermalene Postin..  No further testing needed

## 2015-11-04 ENCOUNTER — Ambulatory Visit (HOSPITAL_BASED_OUTPATIENT_CLINIC_OR_DEPARTMENT_OTHER): Payer: Medicare Other | Admitting: Anesthesiology

## 2015-11-04 ENCOUNTER — Encounter (HOSPITAL_BASED_OUTPATIENT_CLINIC_OR_DEPARTMENT_OTHER): Payer: Self-pay | Admitting: Orthopedic Surgery

## 2015-11-04 ENCOUNTER — Encounter (HOSPITAL_BASED_OUTPATIENT_CLINIC_OR_DEPARTMENT_OTHER)
Admission: RE | Disposition: A | Discharge status: Home / Self Care | Payer: Self-pay | Source: Ambulatory Visit | Attending: Orthopedic Surgery

## 2015-11-04 ENCOUNTER — Ambulatory Visit (HOSPITAL_BASED_OUTPATIENT_CLINIC_OR_DEPARTMENT_OTHER)
Admission: RE | Admit: 2015-11-04 | Discharge: 2015-11-04 | Disposition: A | Discharge status: Home / Self Care | Payer: Medicare Other | Source: Ambulatory Visit | Attending: Orthopedic Surgery | Admitting: Orthopedic Surgery

## 2015-11-04 DIAGNOSIS — I82509 Chronic embolism and thrombosis of unspecified deep veins of unspecified lower extremity: Secondary | ICD-10-CM | POA: Diagnosis not present

## 2015-11-04 DIAGNOSIS — Z85828 Personal history of other malignant neoplasm of skin: Secondary | ICD-10-CM | POA: Diagnosis not present

## 2015-11-04 DIAGNOSIS — Z7901 Long term (current) use of anticoagulants: Secondary | ICD-10-CM | POA: Insufficient documentation

## 2015-11-04 DIAGNOSIS — Z87891 Personal history of nicotine dependence: Secondary | ICD-10-CM | POA: Diagnosis not present

## 2015-11-04 DIAGNOSIS — G5602 Carpal tunnel syndrome, left upper limb: Secondary | ICD-10-CM | POA: Insufficient documentation

## 2015-11-04 DIAGNOSIS — I2782 Chronic pulmonary embolism: Secondary | ICD-10-CM | POA: Insufficient documentation

## 2015-11-04 DIAGNOSIS — I10 Essential (primary) hypertension: Secondary | ICD-10-CM | POA: Insufficient documentation

## 2015-11-04 HISTORY — PX: CARPAL TUNNEL RELEASE: SHX101

## 2015-11-04 SURGERY — CARPAL TUNNEL RELEASE
Anesthesia: Monitor Anesthesia Care | Site: Wrist | Laterality: Left

## 2015-11-04 MED ORDER — CEFAZOLIN SODIUM-DEXTROSE 2-4 GM/100ML-% IV SOLN
INTRAVENOUS | Status: AC
  Filled 2015-11-04: qty 100

## 2015-11-04 MED ORDER — PROPOFOL 10 MG/ML IV BOLUS
INTRAVENOUS | Status: DC | PRN
  Administered 2015-11-04 (×4): 10 mg via INTRAVENOUS

## 2015-11-04 MED ORDER — HYDROCODONE-ACETAMINOPHEN 5-325 MG PO TABS
1.0000 | ORAL_TABLET | Freq: Four times a day (QID) | ORAL | Status: DC | PRN
Start: 2015-11-04 — End: 2015-11-20

## 2015-11-04 MED ORDER — GLYCOPYRROLATE 0.2 MG/ML IJ SOLN: 0.2000 mg | Freq: Once | INTRAMUSCULAR | Status: DC | PRN

## 2015-11-04 MED ORDER — BUPIVACAINE HCL (PF) 0.25 % IJ SOLN
INTRAMUSCULAR | Status: DC | PRN
  Administered 2015-11-04: 9 mL

## 2015-11-04 MED ORDER — FENTANYL CITRATE (PF) 100 MCG/2ML IJ SOLN
50.0000 ug | INTRAMUSCULAR | Status: AC | PRN
  Administered 2015-11-04 (×2): 25 ug via INTRAVENOUS
  Administered 2015-11-04: 50 ug via INTRAVENOUS

## 2015-11-04 MED ORDER — CHLORHEXIDINE GLUCONATE 4 % EX LIQD: 60.0000 mL | Freq: Once | CUTANEOUS | Status: DC

## 2015-11-04 MED ORDER — LACTATED RINGERS IV SOLN
INTRAVENOUS | Status: DC
  Administered 2015-11-04: 11:00:00 via INTRAVENOUS

## 2015-11-04 MED ORDER — SCOPOLAMINE 1 MG/3DAYS TD PT72: 1.0000 | MEDICATED_PATCH | Freq: Once | TRANSDERMAL | Status: DC | PRN

## 2015-11-04 MED ORDER — FENTANYL CITRATE (PF) 100 MCG/2ML IJ SOLN
INTRAMUSCULAR | Status: AC
  Filled 2015-11-04: qty 2

## 2015-11-04 MED ORDER — MIDAZOLAM HCL 2 MG/2ML IJ SOLN: 1.0000 mg | INTRAMUSCULAR | Status: DC | PRN

## 2015-11-04 MED ORDER — LIDOCAINE HCL (PF) 0.5 % IJ SOLN
INTRAMUSCULAR | Status: DC | PRN
  Administered 2015-11-04: 45 mL via INTRAVENOUS

## 2015-11-04 MED ORDER — CEFAZOLIN SODIUM-DEXTROSE 2-4 GM/100ML-% IV SOLN
2.0000 g | INTRAVENOUS | Status: AC
  Administered 2015-11-04: 2 g via INTRAVENOUS

## 2015-11-04 SURGICAL SUPPLY — 38 items
BLADE SURG 15 STRL LF DISP TIS (BLADE) ×1 IMPLANT
BLADE SURG 15 STRL SS (BLADE) ×2
BNDG COHESIVE 3X5 TAN STRL LF (GAUZE/BANDAGES/DRESSINGS) ×3 IMPLANT
BNDG ESMARK 4X9 LF (GAUZE/BANDAGES/DRESSINGS) ×3 IMPLANT
BNDG GAUZE ELAST 4 BULKY (GAUZE/BANDAGES/DRESSINGS) ×3 IMPLANT
CHLORAPREP W/TINT 26ML (MISCELLANEOUS) ×3 IMPLANT
CORDS BIPOLAR (ELECTRODE) ×3 IMPLANT
COVER BACK TABLE 60X90IN (DRAPES) ×3 IMPLANT
COVER MAYO STAND STRL (DRAPES) ×3 IMPLANT
CUFF TOURNIQUET SINGLE 18IN (TOURNIQUET CUFF) IMPLANT
DRAPE EXTREMITY T 121X128X90 (DRAPE) ×3 IMPLANT
DRAPE SURG 17X23 STRL (DRAPES) ×3 IMPLANT
DRSG PAD ABDOMINAL 8X10 ST (GAUZE/BANDAGES/DRESSINGS) ×3 IMPLANT
GAUZE SPONGE 4X4 12PLY STRL (GAUZE/BANDAGES/DRESSINGS) ×3 IMPLANT
GAUZE XEROFORM 1X8 LF (GAUZE/BANDAGES/DRESSINGS) ×3 IMPLANT
GLOVE BIOGEL PI IND STRL 7.0 (GLOVE) ×2 IMPLANT
GLOVE BIOGEL PI IND STRL 8.5 (GLOVE) ×2 IMPLANT
GLOVE BIOGEL PI INDICATOR 7.0 (GLOVE) ×4
GLOVE BIOGEL PI INDICATOR 8.5 (GLOVE) ×4
GLOVE ECLIPSE 6.5 STRL STRAW (GLOVE) ×3 IMPLANT
GLOVE SURG ORTHO 8.0 STRL STRW (GLOVE) ×6 IMPLANT
GOWN STRL REUS W/ TWL LRG LVL3 (GOWN DISPOSABLE) ×1 IMPLANT
GOWN STRL REUS W/TWL LRG LVL3 (GOWN DISPOSABLE) ×2
GOWN STRL REUS W/TWL XL LVL3 (GOWN DISPOSABLE) ×6 IMPLANT
NEEDLE PRECISIONGLIDE 27X1.5 (NEEDLE) ×3 IMPLANT
NS IRRIG 1000ML POUR BTL (IV SOLUTION) ×3 IMPLANT
PACK BASIN DAY SURGERY FS (CUSTOM PROCEDURE TRAY) ×3 IMPLANT
PAD CAST 3X4 CTTN HI CHSV (CAST SUPPLIES) ×1 IMPLANT
PADDING CAST COTTON 3X4 STRL (CAST SUPPLIES) ×2
SPLINT PLASTER CAST XFAST 3X15 (CAST SUPPLIES) ×5 IMPLANT
SPLINT PLASTER XTRA FASTSET 3X (CAST SUPPLIES) ×10
STOCKINETTE 4X48 STRL (DRAPES) ×3 IMPLANT
SUT ETHILON 4 0 PS 2 18 (SUTURE) ×6 IMPLANT
SUT VICRYL 4-0 PS2 18IN ABS (SUTURE) IMPLANT
SYR BULB 3OZ (MISCELLANEOUS) ×3 IMPLANT
SYR CONTROL 10ML LL (SYRINGE) ×3 IMPLANT
TOWEL OR 17X24 6PK STRL BLUE (TOWEL DISPOSABLE) ×3 IMPLANT
UNDERPAD 30X30 (UNDERPADS AND DIAPERS) IMPLANT

## 2015-11-04 NOTE — Transfer of Care (Signed)
Immediate Anesthesia Transfer of Care Note  Patient: Calvin Sloan  Procedure(s) Performed: Procedure(s) with comments: LEFT CARPAL TUNNEL RELEASE, EXTENDED INCISION (Left) - ANESTHESIA: IV REGIONAL UPPER ARM  Patient Location: PACU  Anesthesia Type:Regional  Level of Consciousness: awake, alert , oriented and patient cooperative  Airway & Oxygen Therapy: Patient Spontanous Breathing and Patient connected to face mask oxygen  Post-op Assessment: Report given to RN and Post -op Vital signs reviewed and stable  Post vital signs: Reviewed and stable  Last Vitals:  Filed Vitals:   11/04/15 1014  BP: 161/73  Pulse: 57  Temp: 36.4 C  Resp: 20    Last Pain: There were no vitals filed for this visit.       Complications: No apparent anesthesia complications

## 2015-11-04 NOTE — Brief Op Note (Signed)
11/04/2015  12:00 PM  PATIENT:  Calvin Sloan  82 y.o. male  PRE-OPERATIVE DIAGNOSIS:  LEFT CARPAL TUNNEL SYNDROME  POST-OPERATIVE DIAGNOSIS:  LEFT CARPAL TUNNEL SYNDROME  PROCEDURE:  Procedure(s) with comments: LEFT CARPAL TUNNEL RELEASE, EXTENDED INCISION (Left) - ANESTHESIA: IV REGIONAL UPPER ARM  SURGEON:  Surgeon(s) and Role:    * Daryll Brod, MD - Primary  PHYSICIAN ASSISTANT:   ASSISTANTS: none   ANESTHESIA:   local and regional  EBL:  Total I/O In: 500 [I.V.:500] Out: -   BLOOD ADMINISTERED:none  DRAINS: none   LOCAL MEDICATIONS USED:  BUPIVICAINE   SPECIMEN:  No Specimen  DISPOSITION OF SPECIMEN:  N/A  COUNTS:  YES  TOURNIQUET:   Total Tourniquet Time Documented: Upper Arm (Left) - 35 minutes Total: Upper Arm (Left) - 35 minutes   DICTATION: .Other Dictation: Dictation Number [id number]  PLAN OF CARE: Discharge to home after PACU  PATIENT DISPOSITION:  PACU - hemodynamically stable.

## 2015-11-04 NOTE — Anesthesia Postprocedure Evaluation (Signed)
Anesthesia Post Note  Patient: Calvin Sloan  Procedure(s) Performed: Procedure(s) (LRB): LEFT CARPAL TUNNEL RELEASE, EXTENDED INCISION (Left)  Patient location during evaluation: PACU Anesthesia Type: General Level of consciousness: awake and alert Pain management: pain level controlled Vital Signs Assessment: post-procedure vital signs reviewed and stable Respiratory status: spontaneous breathing, nonlabored ventilation and respiratory function stable Cardiovascular status: blood pressure returned to baseline and stable Postop Assessment: no signs of nausea or vomiting Anesthetic complications: no    Last Vitals:  Filed Vitals:   11/04/15 1230 11/04/15 1300  BP: 144/78 165/74  Pulse: 53 52  Temp:  36.4 C  Resp: 13 16    Last Pain:  Filed Vitals:   11/04/15 1301  PainSc: 0-No pain                 Marquesha Robideau A

## 2015-11-04 NOTE — Anesthesia Preprocedure Evaluation (Signed)
Anesthesia Evaluation  Patient identified by MRN, date of birth, ID band Patient awake    Reviewed: Allergy & Precautions, NPO status , Patient's Chart, lab work & pertinent test results  Airway Mallampati: I  TM Distance: >3 FB Neck ROM: Full    Dental  (+) Teeth Intact, Dental Advisory Given   Pulmonary former smoker,    breath sounds clear to auscultation       Cardiovascular hypertension, Pt. on medications  Rhythm:Regular Rate:Normal     Neuro/Psych  Neuromuscular disease    GI/Hepatic   Endo/Other  Morbid obesity  Renal/GU      Musculoskeletal   Abdominal   Peds  Hematology   Anesthesia Other Findings Hx of DVT with PE and then clot in his heart during a second episode. Now on Coumadin for life.  Reproductive/Obstetrics                             Anesthesia Physical Anesthesia Plan  ASA: III  Anesthesia Plan: MAC and Bier Block   Post-op Pain Management:    Induction: Intravenous  Airway Management Planned: Simple Face Mask  Additional Equipment:   Intra-op Plan:   Post-operative Plan:   Informed Consent: I have reviewed the patients History and Physical, chart, labs and discussed the procedure including the risks, benefits and alternatives for the proposed anesthesia with the patient or authorized representative who has indicated his/her understanding and acceptance.   History available from chart only  Plan Discussed with: CRNA, Anesthesiologist and Surgeon  Anesthesia Plan Comments:         Anesthesia Quick Evaluation

## 2015-11-04 NOTE — Discharge Instructions (Addendum)

## 2015-11-04 NOTE — H&P (Signed)
Calvin Sloan is an 82 y.o. male.   Chief Complaint: numbness left hand HPI: Calvin Sloan is an 82 year old right hand dominant male who comes in with a complaint of numbness and tingling on his left hand. He has seen [name] in the past. He had a carpal tunnel release done on his right side approximately three years ago. He was supposed to have his left side done but had problems and did not have it done. He comes in now having had nerve conductions repeated by Dr. Merlene Laughter revealing severe carpal tunnel syndrome. These are vreviewed. He had nerve conductions done by Dr. Henderson Baltimore back in 2014 revealing a carpal tunnel syndrome on his right with a motor delay of 13, on his left of 8.7. His sensory component lab was 3.3 on his right and 3.5 on his left. He continues to complain of constant numbness and tingling thumb to ring fingers. He has a prior laceration over the distal forearm directly over the median nerve. This occurred with a knife cutting corn. He states that his carpal tunnel release has done well on his right side. He has no history of injury to his neck. He is awakened 7/7 nights. He is complaining of occasional pain, especially if he hits the area. He is on Coumadin for blood clots in his legs, he is treated by Dr. [name] for this. He has no history of injury to the neck or other injuries to his hand. He complains of sharp pain when it occurs. He has a history of arthritis, myasthenia gravis and chronic carpal boss.  History reviewed. No pertinent past medical history.  Past Surgical History:  Procedure Laterality Date  . CHOLECYSTECTOMY    Past Medical History  Diagnosis Date  . Hypertension   . Pulmonary emboli ([place])     in Nov 2003  . B12 deficiency   . Essential tremor   . Skin cancer   . Myasthenia gravis ([place])     since 2004  . Memory deficit     Past Surgical History  Procedure Laterality Date  . Foot surgery  1966    lt orif foot  . Skin cancer excision     . Cholecystectomy  2012    [facility]  . Colonoscopy    . Carpal tunnel release Right 09/14/2012    Procedure: CARPAL TUNNEL RELEASE;  Surgeon: Cammie Sickle., MD;  Location: [place];  Service: Orthopedics;  Laterality: Right;    Family History  Problem Relation Age of Onset  . Heart disease Mother   . Heart disease Father    Social History:  reports that he quit smoking about 47 years ago. He has never used smokeless tobacco. He reports that he does not drink alcohol or use illicit drugs.  Allergies:  Allergies  Allergen Reactions  . Aspirin   . Shellfish Allergy     No prescriptions prior to admission    No results found for this or any previous visit (from the past 48 hour(s)).  No results found.   Pertinent items are noted in HPI.  Height 5\' 9"  (1.753 m), weight 102.513 kg (226 lb).  General appearance: alert, cooperative and appears stated age Head: Normocephalic, without obvious abnormality Neck: no adenopathy and no JVD Resp: clear to auscultation bilaterally Cardio: regular rate and rhythm, S1, S2 normal, no murmur, click, rub or gallop GI: soft, non-tender; bowel sounds normal; no masses,  no organomegaly Extremities: numbness left hand Pulses: 2+ and  symmetric Skin: Skin color, texture, turgor normal. No rashes or lesions Neurologic: Grossly normal Incision/Wound: na  Assessment/Plan Assessment:  1. Carpal tunnel syndrome of left wrist    Plan: PLAN: We have discussed treatment with him. He would like to have this surgically released. He is on Coumadin. We will contact [name] to see how he would like to have this dealt with. Preoperative, perioperative, and postoperative courses were discussed along with risks and complications. He is advised that there is no guarantee with the surgery, the possibility of infection, recurrence, injury to arteries, nerves, tendons, incomplete relief of dystrophy. He would like to proceed. We would  recommend an extended incision to be able to look at the nerve more proximally. Questions are encouraged and answered to his satisfaction.   Rivka Baune R 11/04/2015, 9:10 AM

## 2015-11-04 NOTE — Op Note (Signed)
Dictation Number [id number]

## 2015-11-05 ENCOUNTER — Encounter (HOSPITAL_BASED_OUTPATIENT_CLINIC_OR_DEPARTMENT_OTHER): Payer: Self-pay | Admitting: Orthopedic Surgery

## 2015-11-05 NOTE — Op Note (Signed)
NAMELEDARRIUS, [name]:  [id number]  MEDICAL RECORD NO.:  [id number]  LOCATION:                                 FACILITY: [facility]  PHYSICIAN:  Daryll Brod, M.D.       DATE OF BIRTH:  12/03/1933  DATE OF PROCEDURE:  11/04/2015 DATE OF DISCHARGE:                              OPERATIVE REPORT   PREOPERATIVE DIAGNOSIS:  Carpal tunnel syndrome, left hand.  POSTOPERATIVE DIAGNOSIS:  Carpal tunnel syndrome, left hand.  OPERATION:  Decompression of left median nerve with proximal exploration.  SURGEON:  Daryll Brod, M.D.  ANESTHESIA:  Upper arm IV regional with local infiltration.  ANESTHESIOLOGIST:  Lorrene Reid, M.D.  HISTORY:  The patient is an 82 year old male with a history of carpal tunnel syndrome of left hand.  He has no laceration directly over the median nerve proximal and the distal forearm.  Nerve conductions are positive.  He is complaining of numbness, tingling and pain, is desirous of exploration and decompression.  Pre, peri and postoperative course have been discussed along with risks and complications.  He is aware there was no guarantee with the surgery; the possibility of infection; recurrence of injury to arteries, nerves, tendons; incomplete relief of symptoms and dystrophy.  He is aware that he may have had an old laceration to the median nerve proximally.  In the preoperative area, he was seen, the extremity marked by both patient.  PROCEDURE IN DETAIL:  The patient was brought to the operating room where upper arm IV regional anesthetic was carried out without difficulty.  He was prepped using ChloraPrep, supine position with left arm free.  A 3-minute dry time was allowed, time-out taken, confirming the patient and procedure.  The incision was made on the distal forearm, carried to the ulnar side of the wrist longitudinally down the palm, carried down through the subcutaneous tissue.  Bleeders were meticulously bipolared for  hemostasis due to he was being on Coumadin. The nerve was identified proximally.  A large neuroma was present of this old laceration revealing a partial nerve laceration.  The median nerve was then followed distally with the remainder being intact.  This was followed through the carpal retinaculum with care was taken to protect the ulnar artery and nerve.  The dissection was carried out to the distal margin of the flexor retinaculum.  The nerve was extremely irritated with a tremendous hyperemia along the entire course distal to the old laceration.  Motor branch entered into the muscle distally.  No further lesions were identified.  The wound was copiously irrigated with saline.  The skin was then closed with interrupted 4-0 nylon sutures.  A local infiltration was given, 0.25% bupivacaine without epinephrine, approximately 8 mL was used.  A sterile compressive dressing, volar splint was applied.  On deflation of the tourniquet, all fingers were immediately pinked.  He was taken to the recovery room for observation in satisfactory condition.  He will be discharged to home to return to the [place] in 1 week, on Nor[place].    ______________________________ Daryll Brod, M.D.   ______________________________ Daryll Brod, M.D.    GK/MEDQ  D:  11/04/2015  T:  11/04/2015  Job:  [id number]

## 2015-11-20 ENCOUNTER — Ambulatory Visit (INDEPENDENT_AMBULATORY_CARE_PROVIDER_SITE_OTHER): Payer: Medicare Other | Admitting: Neurology

## 2015-11-20 ENCOUNTER — Telehealth: Payer: Self-pay | Admitting: Neurology

## 2015-11-20 ENCOUNTER — Encounter: Payer: Self-pay | Admitting: Neurology

## 2015-11-20 VITALS — BP 138/69 | HR 69 | Ht 69.0 in | Wt 219.5 lb

## 2015-11-20 DIAGNOSIS — G7000 Myasthenia gravis without (acute) exacerbation: Secondary | ICD-10-CM | POA: Diagnosis not present

## 2015-11-20 DIAGNOSIS — R1310 Dysphagia, unspecified: Secondary | ICD-10-CM | POA: Diagnosis not present

## 2015-11-20 DIAGNOSIS — R0602 Shortness of breath: Secondary | ICD-10-CM

## 2015-11-20 DIAGNOSIS — I2782 Chronic pulmonary embolism: Secondary | ICD-10-CM

## 2015-11-20 NOTE — Telephone Encounter (Signed)
He has myasthenia gravis exacerbation, presented with dysphagia, mild breathing difficulty, worsening double vision, mild to moderate limb muscle weakness  I have ordered IVIG treatment 2 g/kg= 0.4 mg/kg times 5 days,  Please make sure it happened some, and also give him a follow-up appointment in the middle of August.

## 2015-11-20 NOTE — Progress Notes (Signed)
Chief Complaint  Patient presents with  . Myasthenia Gravis    He is here with his wife, Calvin Sloan and daughter-in-law, Calvin Sloan.  He is having more diffficulty with swallowing, shortness of breath, intermittent blurred vision and weakness.  He is no longer taking daily Prednisone and Mestinon 60mg , QID.     Chief Complaint  Patient presents with  . Myasthenia Gravis    He is here with his wife, Calvin Sloan and daughter-in-law, Calvin Sloan.  He is having more diffficulty with swallowing, shortness of breath, intermittent blurred vision and weakness.  He is no longer taking daily Prednisone and Mestinon 60mg , QID.      PATIENT: Calvin Sloan DOB: [birth year]  REASON FOR VISIT: follow up- myasthenia gravis HISTORY FROM: patient  HISTORY OF PRESENT ILLNESS HISTORY 02/18/15 Calvin Sloan): Calvin Sloan is a 82 -year-old right-handed white married male with generalized seropositive myasthenia gravis characterized by bulbar weakness, shortness of breath , and dysphagia. Patient of Calvin Sloan  He was diagnosed in 10/2002 with a positive Tensilon test, positive acetylchoine receptor antibody,and had a positive ANA at 1-160 speckled pattern. Initially he had right eye ptosis and subsequently, double vision, fatigue with chewing, dysphagia, and upper and lower extremity weakness. He was initially treated with Mestinon bromide in June 2004, which was associated with GI side effects.  Prednisone was started in 03/29/2003 and CellCept 05/2003. He developed increased jitteriness, rash, and cramps on CellCept which was discontinued 10/2003. He had progressive bulbar weakness and was admitted 12/2003 for IV IgG therapy which required a PEG tube for dysphagia transiently. He was started on Imuran. 01/17/2004 he was readmitted for 2 days of IV IgG because of shortness of breath. He has not been hospitalized since 01/2004.   He has been slowly tapered off prednisone 09/2007 and was on Imuran and Mestinon bromide.   He had B12 deficiency  and was receiving B12 shots ,but B12 levels off injections have been normal and B12 shots were discontinued.  He has a history of vague visual loss on the left of unknown etiology. He denies swallowing problems, speech problems, voice changes, focal weakness, or double vision.  He is independent in activities of daily living and takes one nap per day.He denies memory loss but his wife states that he does have memory loss. His last blood studies in my office 05/27/10 were normal except a low white blood cell count of 3900.  His dermatologist Calvin Sloan was concerned that the Imuran is associated with increased risk of skin cancer and the imuran was discontinued in 12/30/2010. His skin cancer (basal cell cancer) has improved since his imuran was stopped.  He has noted no change In myasthenic symptoms. His skin has improved. His medication for myasthenia is mesinon bromide 60 milligrams one and one half tablets 4 times a day. He has shortness of breath since his pulmonary emboli in 2003, also complains of excessive fatigue.   He is allergic to aspirin, GI side effect, gastric ulcer, and is on Plavix for a history of TIAs with right arm weakness He has swallowing problems at the beginning of the meal with liquids that improves as the meal continues.   He had right CTS release in May 2014 by Calvin Sloan.  He complains of shortness of breath with walking, bending over to tire his shoed, due to PE 2003, pulmnolgist folllowed up, he does not like to chew on tough beef, he denies swallowing difficulty, no double vision, no droopy eyelid, no significant bleeding muscle  weakness. He is active in yard, shop, threadmill   UPDATE Jan 16th 2015: He is now tapering off Mestinon, there was no significant change in his functional status, he denies double vision, no ptosis, continue complains of shortness of breath with exertion no significant and muscle weakness, he has to wash down his food sometimes,  but no significant swelling, or chewing difficulty, no dysarthria  UPDATE July 16th 2015: He is no longer mestinon, he does complains of increased breathing difficulty from prolonged walking, no chewing difficulty, no double vision, no ptosis.  Laboratory continue to demonstrate positive acetylcholine binding, and modulating antibodies. He did reported that his skin cancer has improved after stopping imuran, could not tolerate cellcept per record. Previously he responded to IVIG very well.  UPDATE Sept 25th 2015: He was stated on prednisone 10mg  qday, and mestinon 60mg  tid in June 2015, which did help his fatigue, shortness of breath, wife reported 50% improvement, he has increased appetite  But he was admitted to [facility] hospital in Sept 17th for bilateral PEs, right leg DVT, he is started on coumadin, bridge with Lovenox, Calvin Sloan is monitoring his INR  He denies double vision, no ptosis, no limb muscle weakness, no dysphagia  UPDATE April 4th 2016: He is on coumadin for PE, INR was monitored by Calvin Sloan, his myasthenia gravis is under good control, is taking prednisone 10 mg every day, he has surgery in Nov 2015 for right leg cellulitis.  He only does little around his house, he has no double vision, SOB due to previous PE,   UPDATE Feb 18 2015: Previous laboratory evaluation showed mild B12 deficiency with level of 204, He did receive IM B12 supplement at his primary care Dr. Lyman Sloan office for a while, now with normal repeat laboratory evaluation, injection has stopped He has mild choking difficulty, this has been going on since Sloan 2016, not getting worse, he has more trouble with liquid. He complains of shortness of breath when bending over. He denies significant gait difficulty, no breathing difficulty otherwise, he denies double vision, no ptosis,, No chewing difficulties   UPDATE May 22 2015: He came in last visit April 16 2015 complains of worsening blurry vision,  double vision, swallowing difficulties, trouble talking, he reported 50% weakness compared to baseline, difficulty getting up from seated position, difficulty chewing, worsening shortness of breath, on examination, he was noted to have increased weakness compared to previous examination, he had moderate eye-closure cheek puff, mild neck flexion weakness, mild bilateral shoulder abduction bilateral hip flexion weakness.  He was treated with IVIG 400 mg/kg every day for 5 days from December 11 to May 02 2015, his vision has much improved, he can walk better, but he continue have swallowing difficulty, dysarthria, he is also taking prednisone 40 mg every day, increase his Mestinon 60 mg to 3-4 tablets daily  He was not able to tolerate immunosuppression treatment in the past due to skin cancer  UPDATE Jul 07 2015:  I have called his wife, pulmonary function test in June 05 2015.  1, FVC is 3.1 5, 87%, this is normal 2. FEV1 is 2.1 6, 77%, with a percent FEV1 of 68, this indicated mild to moderate primary small  obstruction in this patient was a history of cigarette smoking, large elderly flow rates are generally normal  3. Flow volume loop is unremarkable 4, lung volumes indicate some evidence of air trapping with an increased residual volume, the TLC is normal, the increased residual volume confirms  the presence of airflow obstruction  5, DLCO is 19.0, 90%, this is normal 6, elderly resistant 3.7 2, 265%, this is increased  Wife reported, he recently had swallowing study, there was no significant abnormality found, his swallowing, breathing, symptoms overall has improved  He did think that IVIG treatment in Dec 11-16th 2016 has helped his blurry vision, it is gone, he still feel straggled sometimes, he has shortness with exertion and bending over. His talking is much better.  UPDATE May 22nd 2017: He continue complains of shortness of breath with minimum exertion, no diplopia, no double  vision, mild dysphasia,  He had a history of right carpal tunnel release surgery in the past, which has been helpful, now complains of left first 3 finger paresthesia, EMG nerve conduction studies was planned at local hospital  UPDATE July 6th 2017: He stopped taking Mestinon around May 20 third 2017, shortly afterwards, he noticed double vision, he clearly describe eighth of binocular double vision, sometimes vertigo, sometimes horizontal, he went on higher dose of Mestinon 60 mg 4 times a day without improving his symptoms, he slept on a bed with 30 head raise, with no significant breathing difficulty, he could not eat his dinner on July 5th 2017 due to chewing swallowing difficulty, he could not fly flat sleeping, mildly unsteady gait, increased fatigue,  REVIEW OF SYSTEMS: Out of a complete 14 system review of symptoms, the patient complains only of the following symptoms, and all other reviewed systems are negative.  Double vision, blurred vision ALLERGIES: Allergies  Allergen Reactions  . Aspirin   . Shellfish Allergy     HOME MEDICATIONS: Outpatient Prescriptions Prior to Visit  Medication Sig Dispense Refill  . pyridostigmine (MESTINON) 60 MG tablet TAKE 1 TABLET FOUR TIMES DAILY 360 tablet 3  . ranitidine (ZANTAC 75) 75 MG tablet Take 1 tablet (75 mg total) by mouth 2 (two) times daily. 60 tablet 6  . valsartan-hydrochlorothiazide (DIOVAN-HCT) 320-25 MG per tablet Take 1 tablet by mouth daily.    [name] warfarin (COUMADIN) 5 MG tablet 5 mg. Taking 5mg  Mon, Wed, Fri.  Taking 2.5mg  Sun, Tues, Thurs, Sat.    [name] HYDROcodone-acetaminophen (NORCO) 5-325 MG tablet Take 1 tablet by mouth every 6 (six) hours as needed for moderate pain. 30 tablet 0   No facility-administered medications prior to visit.    PAST MEDICAL HISTORY: Past Medical History  Diagnosis Date  . Hypertension   . Pulmonary emboli ([place])     in Nov 2003  . B12 deficiency   . Essential tremor   . Skin cancer   .  Myasthenia gravis ([place])     since 2004  . Memory deficit     PAST SURGICAL HISTORY: Past Surgical History  Procedure Laterality Date  . Foot surgery  1966    lt orif foot  . Skin cancer excision    . Cholecystectomy  2012    [facility]  . Colonoscopy    . Carpal tunnel release Right 09/14/2012    Procedure: CARPAL TUNNEL RELEASE;  Surgeon: Cammie Sickle., MD;  Location: [facility];  Service: Orthopedics;  Laterality: Right;  . Carpal tunnel release Left 11/04/2015    Procedure: LEFT CARPAL TUNNEL RELEASE, EXTENDED INCISION;  Surgeon: Daryll Brod, MD;  Location: [facility];  Service: Orthopedics;  Laterality: Left;  ANESTHESIA: IV REGIONAL UPPER ARM    FAMILY HISTORY: Family History  Problem Relation Age of Onset  . Heart disease Mother   .  Heart disease Father     SOCIAL HISTORY: Social History   Social History  . Marital Status: Married    Spouse Name: Calvin Sloan  . Number of Children: 2  . Years of Education: 12   Occupational History  . Plant     Retired   Social History Main Topics  . Smoking status: Former Smoker    Quit date: 05/17/1968  . Smokeless tobacco: Never Used  . Alcohol Use: No  . Drug Use: No  . Sexual Activity: Not on file   Other Topics Concern  . Not on file   Social History Narrative   He lives with his wife Calvin Sloan(, has 2 children, retired. Hard of hearing.   Right handed.   Caffeine- None      PHYSICAL EXAM  Filed Vitals:   11/20/15 1417  BP: 138/69  Pulse: 69  Height: 5\' 9"  (1.753 m)  Weight: 219 lb 8 oz (99.565 kg)   Body mass index is 32.4 kg/(m^2).   PHYSICAL EXAMNIATION:  Gen: NAD, conversant, well nourised, obese, well groomed                     Cardiovascular: Regular rate rhythm, no peripheral edema, warm, nontender. Eyes: Conjunctivae clear without exudates or hemorrhage Neck: Supple, no carotid bruise. Pulmonary: Clear to auscultation bilaterally   NEUROLOGICAL EXAM:  MENTAL  STATUS: Speech:    Speech is normal; fluent and spontaneous with normal comprehension.  Cognition:     Orientation to time, place and person     Normal recent and remote memory     Normal Attention span and concentration     Normal Language, naming, repeating,spontaneous speech     Fund of knowledge   CRANIAL NERVES: CN II: Visual fields are full to confrontation. Fundoscopic exam is normal with sharp discs and no vascular changes. Pupils are round equal and briskly reactive to light. CN III, IV, VI: extraocular movement are normal. Fatigable ptosis, cover and uncover testing showed bilateral exophoria CN V: Facial sensation is intact to pinprick in all 3 divisions bilaterally. Corneal responses are intact.  CN VII: He has Moderate eye closure, cheek puff weakness. CN VIII: Hearing is normal to rubbing fingers CN IX, X: Palate elevates symmetrically. Phonation is normal. CN XI: Head turning and shoulder shrug are intact CN XII: Tongue is midline with normal movements and no atrophy.  MOTOR:  He has mild neck flexion, bilateral shoulder abduction, external rotation, hip flexion weakness   REFLEXES: Reflexes are 2+ and symmetric at the biceps, triceps, knees, and ankles. Plantar responses are flexor.  SENSORY: Intact to light touch, pinprick, position sense, and vibration sense are intact in fingers and toes.  COORDINATION: Rapid alternating movements and fine finger movements are intact. There is no dysmetria on finger-to-nose and heel-knee-shin.    GAIT/STANCE: He is able to get up from seated position and crossed, But could not get up from low seated position, need to push on, mildly unsteady, difficulty to perform tandem, tiptoe tandem walking  DIAGNOSTIC DATA (LABS, IMAGING, TESTING) - I reviewed patient records, labs, notes, testing and imaging myself where available.   ASSESSMENT AND PLAN 82 y.o. year old male   Myasthenia gravis exacerbation  with dysphagia, mild  breathing difficulty, mild to moderate bulbar limb muscle weakness, worsening double vision, ptosis  Worsening weakness involving extraocular muscles, bulbar, limb muscles  He responded very well to previous IVIG treatment, most recent one was in December 2016,  Not a  candidate for long term immunosuppressive treatment due to her recurrent large area skin cancer  I will restart IVIG 2 g/kg= 0.4 g/kgx5 5 days,  Keep Mestinon 60 mg 4 times a day   Shortness of breath  Refer him to pulmonary function test, showed evidence of small airway construction,  Multifactorial, weakness due to myasthenia gravis exacerbation, history of PE, small airway construction,  Continue Coumadin treatment for pulmonary emboli   Carpal tunnel syndrome  Status post left hand release surgery in June 2017 recovered well       Marcial Pacas, M.D. Ph.D.  [facility] [address], [place], [place] [address] [phone]

## 2015-11-20 NOTE — Telephone Encounter (Signed)
Dr. [name] has spoken w/ Calvin Sloan in [place] and she will get him scheduled for IVIG.  His follow up is 01/01/16.

## 2015-11-26 NOTE — Telephone Encounter (Signed)
Message provided to [place] in [place].

## 2015-11-26 NOTE — Telephone Encounter (Signed)
Pt's wife called to check on status of infusion appt. I offered to put her to Otila Kluver but she declined stating she left a message the other day and will wait for a call.

## 2015-12-01 DIAGNOSIS — G7001 Myasthenia gravis with (acute) exacerbation: Secondary | ICD-10-CM | POA: Diagnosis not present

## 2015-12-02 DIAGNOSIS — G7001 Myasthenia gravis with (acute) exacerbation: Secondary | ICD-10-CM | POA: Diagnosis not present

## 2015-12-03 ENCOUNTER — Other Ambulatory Visit: Payer: Self-pay | Admitting: Neurology

## 2015-12-03 DIAGNOSIS — G7001 Myasthenia gravis with (acute) exacerbation: Secondary | ICD-10-CM | POA: Diagnosis not present

## 2015-12-04 DIAGNOSIS — I82401 Acute embolism and thrombosis of unspecified deep veins of right lower extremity: Secondary | ICD-10-CM | POA: Diagnosis not present

## 2015-12-04 DIAGNOSIS — G7001 Myasthenia gravis with (acute) exacerbation: Secondary | ICD-10-CM | POA: Diagnosis not present

## 2015-12-05 DIAGNOSIS — G7001 Myasthenia gravis with (acute) exacerbation: Secondary | ICD-10-CM | POA: Diagnosis not present

## 2015-12-18 DIAGNOSIS — D649 Anemia, unspecified: Secondary | ICD-10-CM | POA: Diagnosis not present

## 2015-12-18 DIAGNOSIS — S39012A Strain of muscle, fascia and tendon of lower back, initial encounter: Secondary | ICD-10-CM | POA: Diagnosis not present

## 2015-12-18 DIAGNOSIS — R55 Syncope and collapse: Secondary | ICD-10-CM | POA: Diagnosis not present

## 2015-12-18 DIAGNOSIS — I82401 Acute embolism and thrombosis of unspecified deep veins of right lower extremity: Secondary | ICD-10-CM | POA: Diagnosis not present

## 2015-12-18 DIAGNOSIS — Z6832 Body mass index (BMI) 32.0-32.9, adult: Secondary | ICD-10-CM | POA: Diagnosis not present

## 2016-01-01 ENCOUNTER — Encounter: Payer: Self-pay | Admitting: Neurology

## 2016-01-01 ENCOUNTER — Ambulatory Visit (INDEPENDENT_AMBULATORY_CARE_PROVIDER_SITE_OTHER): Payer: Medicare Other | Admitting: Neurology

## 2016-01-01 ENCOUNTER — Telehealth: Payer: Self-pay | Admitting: Neurology

## 2016-01-01 VITALS — BP 137/70 | HR 62 | Ht 69.0 in | Wt 213.0 lb

## 2016-01-01 DIAGNOSIS — R1310 Dysphagia, unspecified: Secondary | ICD-10-CM

## 2016-01-01 DIAGNOSIS — G7000 Myasthenia gravis without (acute) exacerbation: Secondary | ICD-10-CM

## 2016-01-01 DIAGNOSIS — R0602 Shortness of breath: Secondary | ICD-10-CM

## 2016-01-01 NOTE — Telephone Encounter (Signed)
Medical release form faxed to [facility] records at [phone].

## 2016-01-01 NOTE — Progress Notes (Addendum)
Chief Complaint  Patient presents with  . Myasthenia Gravis    He is here with his wife, Calvin Sloan.  He has recently completed IVIG (July 17-21) with only minimal improvements.  He is still having mild breathing difficulty and weakness.  His blurred vision has completely resolved.     Chief Complaint  Patient presents with  . Myasthenia Gravis    He is here with his wife, Calvin Sloan.  He has recently completed IVIG (July 17-21) with only minimal improvements.  He is still having mild breathing difficulty and weakness.  His blurred vision has completely resolved.      PATIENT: Calvin Sloan DOB: 08/19/1933  REASON FOR VISIT: follow up- myasthenia gravis HISTORY FROM: patient  HISTORY OF PRESENT ILLNESS HISTORY 02/18/15 Calvin Sloan): Calvin Sloan is a 82 -year-old right-handed white married male with generalized seropositive myasthenia gravis characterized by bulbar weakness, shortness of breath , and dysphagia. Patient of Dr. Erling Sloan  He was diagnosed in 10/2002 with a positive Tensilon test, positive acetylchoine receptor antibody,and had a positive ANA at 1-160 speckled pattern. Initially he had right eye ptosis and subsequently, double vision, fatigue with chewing, dysphagia, and upper and lower extremity weakness. He was initially treated with Mestinon bromide in June 2004, which was associated with GI side effects.  Prednisone was started in 03/29/2003 and CellCept 05/2003. He developed increased jitteriness, rash, and cramps on CellCept which was discontinued 10/2003. He had progressive bulbar weakness and was admitted 12/2003 for IV IgG therapy which required a PEG tube for dysphagia transiently. He was started on Imuran. 01/17/2004 he was readmitted for 2 days of IV IgG because of shortness of breath. He has not been hospitalized since 01/2004.   He has been slowly tapered off prednisone 09/2007 and was on Imuran and Mestinon bromide.   He had B12 deficiency and was receiving B12 shots ,but B12  levels off injections have been normal and B12 shots were discontinued.  He has a history of vague visual loss on the left of unknown etiology. He denies swallowing problems, speech problems, voice changes, focal weakness, or double vision.  He is independent in activities of daily living and takes one nap per day.He denies memory loss but his wife states that he does have memory loss. His last blood studies in my office 05/27/10 were normal except a low white blood cell count of 3900.  His dermatologist Dr. Janann Sloan was concerned that the Imuran is associated with increased risk of skin cancer and the imuran was discontinued in 12/30/2010. His skin cancer (basal cell cancer) has improved since his imuran was stopped.  He has noted no change In myasthenic symptoms. His skin has improved. His medication for myasthenia is mesinon bromide 60 milligrams one and one half tablets 4 times a day. He has shortness of breath since his pulmonary emboli in 2003, also complains of excessive fatigue.   He is allergic to aspirin, GI side effect, gastric ulcer, and is on Plavix for a history of TIAs with right arm weakness He has swallowing problems at the beginning of the meal with liquids that improves as the meal continues.   He had right CTS release in May 2014 by Dr. Daylene Sloan.  He complains of shortness of breath with walking, bending over to tire his shoed, due to PE 2003, pulmnolgist folllowed up, he does not like to chew on tough beef, he denies swallowing difficulty, no double vision, no droopy eyelid, no significant bleeding muscle weakness. He is active  in yard, shop, threadmill   UPDATE Jan 16th 2015: He is now tapering off Mestinon, there was no significant change in his functional status, he denies double vision, no ptosis, continue complains of shortness of breath with exertion no significant and muscle weakness, he has to wash down his food sometimes, but no significant swelling, or chewing  difficulty, no dysarthria  UPDATE July 16th 2015: He is no longer mestinon, he does complains of increased breathing difficulty from prolonged walking, no chewing difficulty, no double vision, no ptosis.  Laboratory continue to demonstrate positive acetylcholine binding, and modulating antibodies. He did reported that his skin cancer has improved after stopping imuran, could not tolerate cellcept per record. Previously he responded to IVIG very well.  UPDATE Sept 25th 2015: He was stated on prednisone 10mg  qday, and mestinon 60mg  tid in June 2015, which did help his fatigue, shortness of breath, wife reported 50% improvement, he has increased appetite  But he was admitted to [facility] hospital in Sept 17th for bilateral PEs, right leg DVT, he is started on coumadin, bridge with Lovenox, Calvin Sloan is monitoring his INR  He denies double vision, no ptosis, no limb muscle weakness, no dysphagia  UPDATE April 4th 2016: He is on coumadin for PE, INR was monitored by Calvin Sloan, his myasthenia gravis is under good control, is taking prednisone 10 mg every day, he has surgery in Nov 2015 for right leg cellulitis.  He only does little around his house, he has no double vision, SOB due to previous PE,   UPDATE Feb 18 2015: Previous laboratory evaluation showed mild B12 deficiency with level of 204, He did receive IM B12 supplement at his primary care Dr. Lyman Sloan office for a while, now with normal repeat laboratory evaluation, injection has stopped He has mild choking difficulty, this has been going on since Sloan 2016, not getting worse, he has more trouble with liquid. He complains of shortness of breath when bending over. He denies significant gait difficulty, no breathing difficulty otherwise, he denies double vision, no ptosis,, No chewing difficulties   UPDATE May 22 2015: He came in last visit April 16 2015 complains of worsening blurry vision, double vision, swallowing difficulties,  trouble talking, he reported 50% weakness compared to baseline, difficulty getting up from seated position, difficulty chewing, worsening shortness of breath, on examination, he was noted to have increased weakness compared to previous examination, he had moderate eye-closure cheek puff, mild neck flexion weakness, mild bilateral shoulder abduction bilateral hip flexion weakness.  He was treated with IVIG 400 mg/kg every day for 5 days from December 11 to May 02 2015, his vision has much improved, he can walk better, but he continue have swallowing difficulty, dysarthria, he is also taking prednisone 40 mg every day, increase his Mestinon 60 mg to 3-4 tablets daily  He was not able to tolerate immunosuppression treatment in the past due to skin cancer  UPDATE Jul 07 2015:  I have called his wife, pulmonary function test in June 05 2015.  1, FVC is 3.1 5, 87%, this is normal 2. FEV1 is 2.1 6, 77%, with a percent FEV1 of 68, this indicated mild to moderate primary small  obstruction in this patient was a history of cigarette smoking, large elderly flow rates are generally normal  3. Flow volume loop is unremarkable 4, lung volumes indicate some evidence of air trapping with an increased residual volume, the TLC is normal, the increased residual volume confirms the presence of airflow  obstruction  5, DLCO is 19.0, 90%, this is normal 6, elderly resistant 3.7 2, 265%, this is increased  Wife reported, he recently had swallowing study, there was no significant abnormality found, his swallowing, breathing, symptoms overall has improved  He did think that IVIG treatment in Dec 11-16th 2016 has helped his blurry vision, it is gone, he still feel straggled sometimes, he has shortness with exertion and bending over. His talking is much better.  UPDATE May 22nd 2017: He continue complains of shortness of breath with minimum exertion, no diplopia, no double vision, mild dysphasia,  He had a  history of right carpal tunnel release surgery in the past, which has been helpful, now complains of left first 3 finger paresthesia, EMG nerve conduction studies was planned at local hospital  UPDATE July 6th 2017: He stopped taking Mestinon around May 20 third 2017, shortly afterwards, he noticed double vision, he clearly describe eighth of binocular double vision, sometimes vertigo, sometimes horizontal, he went on higher dose of Mestinon 60 mg 4 times a day without improving his symptoms, he slept on a bed with 30 head raise, with no significant breathing difficulty, he could not eat his dinner on July 5th 2017 due to chewing swallowing difficulty, he could not fly flat sleeping, mildly unsteady gait, increased fatigue,  UPDATE Sloan 17th 2017: Last IVIG treatment was December 05 2015, he did very well for a while, was able to cut grass, trim his lawn, it did helped him for at least 2 weeks, then he had passing out   I reviewed laboratory evaluation in Sloan 2017, normal B12, ferritin level, iron binding capacity was within normal limit, low hemoglobin 10 point 8, creatinine was elevated 1.53, glucose was 125  Previous laboratory evaluation in 2016 showed normal folic acid 12 point 5,low normal B12 level 266, hemoglobin in January 2015 was 14 point 3  He will be evaluated by hematologist/oncologist Dr. Mervin Kung in September 6th 2017, at [facility]  He complains of low back pain since he fell you Sloan 1st 2017, has been taking intermittent Tylenol,  He complains of difficulty swallowing, both liquid, and solid food, no double vision, He is taking mestinon 60mg  4 times a day, which did help him.  REVIEW OF SYSTEMS: Out of a complete 14 system review of symptoms, the patient complains only of the following symptoms, and all other reviewed systems are negative.  Double vision, blurred vision ALLERGIES: Allergies  Allergen Reactions  . Aspirin   . Shellfish Allergy     HOME  MEDICATIONS: Outpatient Medications Prior to Visit  Medication Sig Dispense Refill  . pyridostigmine (MESTINON) 60 MG tablet TAKE 1 TABLET FOUR TIMES DAILY 360 tablet 3  . ranitidine (ZANTAC) 75 MG tablet TAKE 1 TABLET TWICE DAILY 180 tablet 3  . valsartan-hydrochlorothiazide (DIOVAN-HCT) 320-25 MG per tablet Take 1 tablet by mouth daily.    [name] warfarin (COUMADIN) 5 MG tablet 5 mg. Taking 5mg  Mon, Wed, Fri.  Taking 2.5mg  Sun, Tues, Thurs, Sat.     No facility-administered medications prior to visit.     PAST MEDICAL HISTORY: Past Medical History:  Diagnosis Date  . B12 deficiency   . Essential tremor   . Hypertension   . Memory deficit   . Myasthenia gravis ([place])    since 2004  . Pulmonary emboli ([place])    in Nov 2003  . Skin cancer     PAST SURGICAL HISTORY: Past Surgical History:  Procedure Laterality Date  . CARPAL  TUNNEL RELEASE Right 09/14/2012   Procedure: CARPAL TUNNEL RELEASE;  Surgeon: Cammie Sickle., MD;  Location: [facility];  Service: Orthopedics;  Laterality: Right;  . CARPAL TUNNEL RELEASE Left 11/04/2015   Procedure: LEFT CARPAL TUNNEL RELEASE, EXTENDED INCISION;  Surgeon: Daryll Brod, MD;  Location: [facility];  Service: Orthopedics;  Laterality: Left;  ANESTHESIA: IV REGIONAL UPPER ARM  . CHOLECYSTECTOMY  2012   [facility]  . COLONOSCOPY    . FOOT SURGERY  1966   lt orif foot  . SKIN CANCER EXCISION      FAMILY HISTORY: Family History  Problem Relation Age of Onset  . Heart disease Mother   . Heart disease Father     SOCIAL HISTORY: Social History   Social History  . Marital status: Married    Spouse name: Calvin Sloan  . Number of children: 2  . Years of education: 12   Occupational History  . Plant     Retired   Social History Main Topics  . Smoking status: Former Smoker    Quit date: 05/17/1968  . Smokeless tobacco: Never Used  . Alcohol use No  . Drug use: No  . Sexual activity: Not on file   Other Topics  Concern  . Not on file   Social History Narrative   He lives with his wife Calvin Sloan(, has 2 children, retired. Hard of hearing.   Right handed.   Caffeine- None      PHYSICAL EXAM  Vitals:   01/01/16 1621  BP: 137/70  Pulse: 62  Weight: 213 lb (96.6 kg)  Height: 5\' 9"  (1.753 m)   Body mass index is 31.45 kg/m.   PHYSICAL EXAMNIATION:  Gen: NAD, conversant, well nourised, obese, well groomed                     Cardiovascular: Regular rate rhythm, no peripheral edema, warm, nontender. Eyes: Conjunctivae clear without exudates or hemorrhage Neck: Supple, no carotid bruise. Pulmonary: Clear to auscultation bilaterally   NEUROLOGICAL EXAM:  MENTAL STATUS: Speech:    Speech is normal; fluent and spontaneous with normal comprehension.  Cognition:     Orientation to time, place and person     Normal recent and remote memory     Normal Attention span and concentration     Normal Language, naming, repeating,spontaneous speech     Fund of knowledge   CRANIAL NERVES: CN II: Visual fields are full to confrontation. Fundoscopic exam is normal with sharp discs and no vascular changes. Pupils are round equal and briskly reactive to light. CN III, IV, VI: extraocular movement are normal. Fatigable ptosis, cover and uncover testing showed bilateral exophoria CN V: Facial sensation is intact to pinprick in all 3 divisions bilaterally. Corneal responses are intact.  CN VII: He has Moderate eye closure, cheek puff weakness. CN VIII: Hearing is normal to rubbing fingers CN IX, X: Palate elevates symmetrically. Phonation is normal. CN XI: Head turning and shoulder shrug are intact CN XII: Tongue is midline with normal movements and no atrophy.  MOTOR:  He has mild neck flexion, there was no significant bilateral upper or lower extremity muscle weakness  REFLEXES: Reflexes are 2+ and symmetric at the biceps, triceps, knees, and ankles. Plantar responses are flexor.  SENSORY: Intact  to light touch, pinprick, position sense, and vibration sense are intact in fingers and toes.  COORDINATION: Rapid alternating movements and fine finger movements are intact. There is no dysmetria on  finger-to-nose and heel-knee-shin.    GAIT/STANCE: He is able to get up from seated position and crossed, But could not get up from low seated position, need to push on, mildly unsteady, difficulty to perform tandem, tiptoe tandem walking  DIAGNOSTIC DATA (LABS, IMAGING, TESTING) - I reviewed patient records, labs, notes, testing and imaging myself where available.   ASSESSMENT AND PLAN 82 y.o. year old male   Myasthenia gravis exacerbation  with dysphagia, mild breathing difficulty, mild to moderate bulbar limb muscle weakness, worsening double vision, ptosis  Worsening weakness involving extraocular muscles, bulbar, limb muscles  He responded very well to previous IVIG treatment, most recent one was in December 2016,  Not a candidate for long term immunosuppressive treatment due to her recurrent large area skin cancer  Last IVIG 2 g/kg= 0.4 g/kgx5 5 days finished in December 05 2015  Keep Mestinon 60 mg 4 times a day   Shortness of breath  Refer him to pulmonary function test, showed evidence of small airway construction,  Multifactorial, weakness due to myasthenia gravis exacerbation, history of PE, small airway construction,  Continue Coumadin treatment for pulmonary emboli New onset anemia  Is going through hematology evaluation  May consider repeat IVIG if not contraindicated   Carpal tunnel syndrome  Status post left hand release surgery in June 2017 recovered well       Marcial Pacas, M.D. Ph.D.  [facility] [address], [place], [place] [address] [phone]   Addendum: I reviewed modified barium swallowing study on June 06 2015 at [facility], there was mild oropharyngeal dysphagia, characterized by spillover to the valleculae  and the piriform sinus secondary to reduced oral motor coordination   patient was felt to safe to consume a regular diet with a full range of liquid with  recommendation of upright for food by mouth, and 45 minutes after meals, head flexed slightly forward , swallow at slower rate

## 2016-01-01 NOTE — Telephone Encounter (Signed)
Please get medical record of his swallowing test from [facility]

## 2016-01-02 DIAGNOSIS — I2699 Other pulmonary embolism without acute cor pulmonale: Secondary | ICD-10-CM | POA: Diagnosis not present

## 2016-01-13 ENCOUNTER — Telehealth: Payer: Self-pay | Admitting: *Deleted

## 2016-01-13 DIAGNOSIS — M545 Low back pain: Secondary | ICD-10-CM | POA: Diagnosis not present

## 2016-01-13 DIAGNOSIS — G7000 Myasthenia gravis without (acute) exacerbation: Secondary | ICD-10-CM | POA: Diagnosis not present

## 2016-01-13 DIAGNOSIS — Z6832 Body mass index (BMI) 32.0-32.9, adult: Secondary | ICD-10-CM | POA: Diagnosis not present

## 2016-01-13 DIAGNOSIS — R55 Syncope and collapse: Secondary | ICD-10-CM | POA: Diagnosis not present

## 2016-01-13 DIAGNOSIS — N189 Chronic kidney disease, unspecified: Secondary | ICD-10-CM | POA: Diagnosis not present

## 2016-01-13 NOTE — Telephone Encounter (Signed)
Faxed over med release form on 01/01/16 to [facility] requesting results of swallow study.  Followed up on request.  They were unable to located the study.  I was transferred to the pulmonary department.  Left message for a return call. (ph: [phone], fax: [phone])

## 2016-01-14 NOTE — Telephone Encounter (Signed)
Called medical records again this morning and gave the date of test (provided by pt's wife).  They were able to locate the results and have faxed them to our office.  Records placed on Dr. Rhea Belton desk for review.

## 2016-01-20 DIAGNOSIS — Z01818 Encounter for other preprocedural examination: Secondary | ICD-10-CM | POA: Diagnosis not present

## 2016-01-21 ENCOUNTER — Encounter (HOSPITAL_COMMUNITY): Payer: Medicare Other

## 2016-01-21 ENCOUNTER — Encounter (HOSPITAL_COMMUNITY): Payer: Medicare Other | Attending: Hematology | Admitting: Hematology

## 2016-01-21 ENCOUNTER — Encounter (HOSPITAL_COMMUNITY): Payer: Self-pay | Admitting: Hematology

## 2016-01-21 VITALS — BP 126/64 | HR 65 | Temp 97.8°F | Resp 18 | Ht 69.0 in | Wt 214.2 lb

## 2016-01-21 DIAGNOSIS — Z86718 Personal history of other venous thrombosis and embolism: Secondary | ICD-10-CM | POA: Insufficient documentation

## 2016-01-21 DIAGNOSIS — R1310 Dysphagia, unspecified: Secondary | ICD-10-CM | POA: Insufficient documentation

## 2016-01-21 DIAGNOSIS — Z87891 Personal history of nicotine dependence: Secondary | ICD-10-CM | POA: Insufficient documentation

## 2016-01-21 DIAGNOSIS — D649 Anemia, unspecified: Secondary | ICD-10-CM | POA: Insufficient documentation

## 2016-01-21 DIAGNOSIS — Z9049 Acquired absence of other specified parts of digestive tract: Secondary | ICD-10-CM | POA: Insufficient documentation

## 2016-01-21 DIAGNOSIS — M545 Low back pain: Secondary | ICD-10-CM | POA: Insufficient documentation

## 2016-01-21 DIAGNOSIS — I120 Hypertensive chronic kidney disease with stage 5 chronic kidney disease or end stage renal disease: Secondary | ICD-10-CM | POA: Diagnosis not present

## 2016-01-21 DIAGNOSIS — Z85828 Personal history of other malignant neoplasm of skin: Secondary | ICD-10-CM | POA: Diagnosis not present

## 2016-01-21 DIAGNOSIS — Z86711 Personal history of pulmonary embolism: Secondary | ICD-10-CM | POA: Diagnosis not present

## 2016-01-21 DIAGNOSIS — Z9889 Other specified postprocedural states: Secondary | ICD-10-CM | POA: Diagnosis not present

## 2016-01-21 DIAGNOSIS — N185 Chronic kidney disease, stage 5: Secondary | ICD-10-CM | POA: Diagnosis not present

## 2016-01-21 DIAGNOSIS — G7000 Myasthenia gravis without (acute) exacerbation: Secondary | ICD-10-CM | POA: Diagnosis not present

## 2016-01-21 DIAGNOSIS — I10 Essential (primary) hypertension: Secondary | ICD-10-CM | POA: Diagnosis not present

## 2016-01-21 DIAGNOSIS — R531 Weakness: Secondary | ICD-10-CM | POA: Insufficient documentation

## 2016-01-21 DIAGNOSIS — G250 Essential tremor: Secondary | ICD-10-CM | POA: Insufficient documentation

## 2016-01-21 DIAGNOSIS — E538 Deficiency of other specified B group vitamins: Secondary | ICD-10-CM | POA: Insufficient documentation

## 2016-01-21 DIAGNOSIS — Z7901 Long term (current) use of anticoagulants: Secondary | ICD-10-CM | POA: Insufficient documentation

## 2016-01-21 DIAGNOSIS — D519 Vitamin B12 deficiency anemia, unspecified: Secondary | ICD-10-CM

## 2016-01-21 DIAGNOSIS — Z79899 Other long term (current) drug therapy: Secondary | ICD-10-CM | POA: Insufficient documentation

## 2016-01-21 DIAGNOSIS — R55 Syncope and collapse: Secondary | ICD-10-CM | POA: Insufficient documentation

## 2016-01-21 DIAGNOSIS — N189 Chronic kidney disease, unspecified: Secondary | ICD-10-CM

## 2016-01-21 LAB — COMPREHENSIVE METABOLIC PANEL
ALK PHOS: 71 U/L (ref 38–126)
ALT: 21 U/L (ref 17–63)
AST: 27 U/L (ref 15–41)
Albumin: 4.3 g/dL (ref 3.5–5.0)
Anion gap: 7 (ref 5–15)
BUN: 13 mg/dL (ref 6–20)
CALCIUM: 9.7 mg/dL (ref 8.9–10.3)
CO2: 30 mmol/L (ref 22–32)
CREATININE: 1.38 mg/dL — AB (ref 0.61–1.24)
Chloride: 102 mmol/L (ref 101–111)
GFR calc non Af Amer: 47 mL/min — ABNORMAL LOW (ref >60)
GFR, EST AFRICAN AMERICAN: 54 mL/min — AB (ref >60)
GLUCOSE: 94 mg/dL (ref 65–99)
Potassium: 4.4 mmol/L (ref 3.5–5.1)
SODIUM: 139 mmol/L (ref 135–145)
Total Bilirubin: 0.8 mg/dL (ref 0.3–1.2)
Total Protein: 7.8 g/dL (ref 6.5–8.1)

## 2016-01-21 LAB — CBC WITH DIFFERENTIAL/PLATELET
Basophils Absolute: 0.1 10*3/uL (ref 0.0–0.1)
Basophils Relative: 1 %
EOS PCT: 1 %
Eosinophils Absolute: 0.1 10*3/uL (ref 0.0–0.7)
HCT: 41.5 % (ref 39.0–52.0)
Hemoglobin: 13.8 g/dL (ref 13.0–17.0)
LYMPHS ABS: 1.2 10*3/uL (ref 0.7–4.0)
LYMPHS PCT: 22 %
MCH: 31.9 pg (ref 26.0–34.0)
MCHC: 33.3 g/dL (ref 30.0–36.0)
MCV: 96.1 fL (ref 78.0–100.0)
MONO ABS: 0.4 10*3/uL (ref 0.1–1.0)
MONOS PCT: 7 %
Neutro Abs: 3.8 10*3/uL (ref 1.7–7.7)
Neutrophils Relative %: 69 %
PLATELETS: 193 10*3/uL (ref 150–400)
RBC: 4.32 MIL/uL (ref 4.22–5.81)
RDW: 14.2 % (ref 11.5–15.5)
WBC: 5.5 10*3/uL (ref 4.0–10.5)

## 2016-01-21 LAB — RETICULOCYTES
RBC.: 4.32 MIL/uL (ref 4.22–5.81)
Retic Count, Absolute: 82.1 10*3/uL (ref 19.0–186.0)
Retic Ct Pct: 1.9 % (ref 0.4–3.1)

## 2016-01-21 LAB — IRON AND TIBC
Iron: 94 ug/dL (ref 45–182)
Saturation Ratios: 31 % (ref 17.9–39.5)
TIBC: 307 ug/dL (ref 250–450)
UIBC: 213 ug/dL

## 2016-01-21 LAB — LACTATE DEHYDROGENASE: LDH: 148 U/L (ref 98–192)

## 2016-01-21 LAB — FERRITIN: Ferritin: 163 ng/mL (ref 24–336)

## 2016-01-21 LAB — VITAMIN B12: Vitamin B-12: 1480 pg/mL — ABNORMAL HIGH (ref 180–914)

## 2016-01-21 LAB — TSH: TSH: 1.505 u[IU]/mL (ref 0.350–4.500)

## 2016-01-21 NOTE — Patient Instructions (Addendum)
[place] at [facility]  Discharge Instructions:  Seen by MD Irene Limbo today.  Labs will be drawn today.  Follow up will be in 1 month.   _______________________________________________________________  Thank you for choosing [place] at [facility] to provide your oncology and hematology care.  To afford each patient quality time with our providers, please arrive at least 15 minutes before your scheduled appointment.  You need to re-schedule your appointment if you arrive 10 or more minutes late.  We strive to give you quality time with our providers, and arriving late affects you and other patients whose appointments are after yours.  Also, if you no show three or more times for appointments you may be dismissed from the clinic.  Again, thank you for choosing [place] at [place] hope is that these requests will allow you access to exceptional care and in a timely manner. _______________________________________________________________  If you have questions after your visit, please contact our office at [phone] between the hours of 8:30 a.m. and 5:00 p.m. Voicemails left after 4:30 p.m. will not be returned until the following business day. _______________________________________________________________  For prescription refill requests, have your pharmacy contact our office. _______________________________________________________________  Recommendations made by the consultant and any test results will be sent to your referring physician. _______________________________________________________________

## 2016-01-21 NOTE — Progress Notes (Signed)
Calvin Sloan    HEMATOLOGY/ONCOLOGY CONSULTATION NOTE  Date of Service: 01/21/2016  Patient Care Team: Calvin Percy, MD as PCP - General (Family Medicine)  CHIEF COMPLAINTS/PURPOSE OF CONSULTATION:  Anemia  HISTORY OF PRESENTING ILLNESS:   Calvin Sloan is a wonderful 82 y.o. male who has been referred to [place] by Dr .Calvin Percy, MD for evaluation and management of Anemia.  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  Patient had recent labs with his primary care physician on 01/14/2016 which showed mild anemia with hemoglobin of 10.8, MCV of 94 with a normal WBC count of 4.8k and normal platelet count of 221k.  He previously had a hemoglobin in the 14 range in 2015 but we don't have any labs since then to determine the timing of his drop in hemoglobin. He does not have any overt distant exertion or shortness of breath.  Patient reports that he has had increased fatigue but believes that this is probably related to relapse of his myasthenia gravis. He was was diagnosed with myasthenia gravis in 2004 when he was treated with IVIG and steroids and has been on continued Mestinon. He reports that he was in remission for a long time and that his symptoms returns about 2 years ago. He follows with Dr. Krista Sloan at [facility] neurology. Has recently been restarted on IVIG with a plan to continue this once monthly. Has some issues with dysphagia and ptosis and generalized weakness.  She reports he has a history of B12 deficiency and was on B12 shots before.  Labs show a creatinine of 1.53 and his wife reports that he has been told that he has some chronic kidney disease from last year.  He reports having had a syncopal episode in early August which was thought to be related to dehydration since he was working outside in the heat. He notes that he had  some increase in low back pain since that episode. He reports he had an x-ray of his low back which was negative. He has been set up for a CT of his lumbar spine later this month at [facility].  He reports no weight loss no night sweats, shortness of breath no chest pain no abdominal pain no change in bowel habits no change in urinary habits. Patient notes he drinks a lot of water and tends to feel a lot since he is on diuretics.  He does not report any blood in the stools or blood in the urine or any other overt bleeding.  Notes that he had bilateral PE and DVT in 2003 after traveling and was on anticoagulation for 6 months. He had recurrent DVT apparently in 2015 which was thought to be unprovoked and was started on ongoing Coumadin anticoagulation since then.  No other acute focal symptoms.  No history of previous blood transfusions.  MEDICAL HISTORY:  Past Medical History:  Diagnosis Date  . B12 deficiency   . Essential tremor   . Hypertension   . Memory deficit   . Myasthenia gravis ([place])    since 2004  . Pulmonary emboli ([place])    in Nov 2003  . 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   SURGICAL HISTORY: Past Surgical History:  Procedure Laterality Date  . CARPAL TUNNEL RELEASE Right 09/14/2012   Procedure: CARPAL TUNNEL RELEASE;  Surgeon: Cammie Sickle., MD;  Location: [facility];  Service: Orthopedics;  Laterality: Right;  . CARPAL TUNNEL RELEASE Left 11/04/2015   Procedure: LEFT CARPAL TUNNEL RELEASE, EXTENDED INCISION;  Surgeon: Daryll Brod, MD;  Location: [facility];  Service: Orthopedics;  Laterality: Left;  ANESTHESIA: IV REGIONAL UPPER ARM  . CHOLECYSTECTOMY  2012   [place]  . COLONOSCOPY    .  FOOT SURGERY  1966   lt orif foot  . SKIN CANCER EXCISION      SOCIAL HISTORY: Social History   Social History  . Marital status: Married    Spouse name: Calvin Sloan  . Number of children: 2  . Years of education: 12   Occupational History  . Plant     Retired   Social History Main Topics  . Smoking status: Former Smoker    Quit date: 05/17/1968  . Smokeless tobacco: Never Used  . Alcohol use No  . Drug use: No  . Sexual activity: Not on file   Other Topics Concern  . Not on file   Social History Narrative   He lives with his wife Calvin Sloan(, has 2 children, retired. Hard of hearing.   Right handed.   Caffeine- None    FAMILY HISTORY: Family History  Problem Relation Age of Onset  . Heart disease Mother   . Heart disease Father     ALLERGIES:  is allergic to aspirin and shellfish allergy.  MEDICATIONS:  Current Outpatient Prescriptions  Medication Sig Dispense Refill  . pyridostigmine (MESTINON) 60 MG tablet TAKE 1 TABLET FOUR TIMES DAILY 360 tablet 3  . ranitidine (ZANTAC) 75 MG tablet TAKE 1 TABLET TWICE DAILY 180 tablet 3  . valsartan-hydrochlorothiazide (DIOVAN-HCT) 320-25 MG per tablet Take 1 tablet by mouth daily.    Calvin Sloan warfarin (COUMADIN) 5 MG tablet 5 mg. Taking 5mg  Mon, Wed, Fri.  Taking 2.5mg  Sun, Tues, Thurs, Sat.     No current facility-administered medications for this visit.     REVIEW OF SYSTEMS:    10 Point review of Systems was done is negative except as noted above.  PHYSICAL EXAMINATION: ECOG PERFORMANCE STATUS: 2 - Symptomatic, <50% confined to bed  . Vitals:   01/21/16 1002  BP: 126/64  Pulse: 65  Resp: 18  Temp: 97.8 F (36.6 C)   Filed Weights   01/21/16 1002  Weight: 214 lb 3 oz (97.2 kg)   .Body mass index is 31.63 kg/m.  GENERAL:alert, in no acute distress and comfortable SKIN: skin color, texture, turgor are normal, no rashes or significant lesions EYES: normal, conjunctiva are pink and non-injected, sclera  clear OROPHARYNX:no exudate, no erythema and lips, buccal mucosa, and tongue normal  NECK: supple, no JVD, thyroid normal size, non-tender, without nodularity LYMPH:  no palpable lymphadenopathy in the cervical, axillary or inguinal LUNGS: clear to auscultation with normal respiratory effort HEART: regular rate & rhythm,  2 x 6 systolic murmur over the aortic area murmurs and trace lower extremity edema ABDOMEN: abdomen soft, non-tender, normoactive bowel sounds  Musculoskeletal: no cyanosis of digits and no clubbing  PSYCH: alert & oriented x 3 with fluent speech NEURO: no focal motor/sensory deficits  LABORATORY DATA:  I have reviewed the data as listed  . CBC Latest Ref Rng & Units 06/01/2013 09/14/2012  WBC 3.4 -  10.8 x10E3/uL 4.4 -  Hemoglobin 12.6 - 17.7 g/dL 14.3 14.4  Hematocrit 37.5 - 51.0 % 41.0 -  Platelets 150 - 379 x10E3/uL 204 -    . CMP Latest Ref Rng & Units 10/30/2015 08/19/2014 06/01/2013  Glucose 65 - 99 mg/dL 181(H) 124(H) 185(H)  BUN 6 - 20 mg/dL 14 18 19   Creatinine 0.61 - 1.24 mg/dL 1.50(H) 1.13 1.49(H)  Sodium 135 - 145 mmol/L 135 140 141  Potassium 3.5 - 5.1 mmol/L 3.2(L) 4.9 4.0  Chloride 101 - 111 mmol/L 103 98 101  CO2 22 - 32 mmol/L 25 24 18   Calcium 8.9 - 10.3 mg/dL 9.0 9.5 8.9  Total Protein 6.0 - 8.5 g/dL - 6.8 6.7  Total Bilirubin 0.0 - 1.2 mg/dL - 0.4 0.4  Alkaline Phos 39 - 117 IU/L - 50 61  AST 0 - 40 IU/L - 24 26  ALT 0 - 44 IU/L - 27 25     RADIOGRAPHIC STUDIES: I have personally reviewed the radiological images as listed and agreed with the findings in the report. No results found.  ASSESSMENT & PLAN:   82 year old Caucasian male with  #1 normocytic normochromic anemia. Normal WBC and platelet counts. Patient will 10.8 and MCV of 94 based on outside outside labs from 12/18/2015. This could likely be from his chronic kidney disease. He has had a history of B12 deficiency and is on B12 oral replacement at this time. Has had a history of  myasthenia gravis since 2004. Was in remission for a long time and had relapse over the last 1-2 years. Currently was restarted on IVIG which can cause some low-level hemolytic anemia. Myasthenia gravis can also be associated with pure red cell aplasia - less likely at this time. Given his history of B12 deficiency and myasthenia gravis cannot rule out the possibility of autoimmune thyroiditis related hypothyroidism. No overt evidence of blood loss Plan -CBC with differential, reticulocyte count, hemolytic markers, TSH,  -Myeloma panel  -Anti-intrinsic and antiparietal cell antibodies to rule out pernicious anemia since he has a history of B12 deficiency . -Given his chronic kidney disease would check ferritin and replace to maintain levels more than 100 patiently if his hemoglobin is lower and ESA rx is contemplated. -Was recommended to forward a copy of his CT of the lower back to rule out any bone tumor is our suggestions of myeloma . -Reasonable to take a daily multivitamin without vitamin K   #2History of recurrent VTE - on chronic anticoagulation -monitored by PCP  #3 history of myasthenia gravis - currently in relapse started on IVIG and is on Mestinon. [facility].  #4 chronic kidney disease #5 hypertension Plan -Any follow-up with primary care physician for other medical comorbidities  We'll get labs today. RTC with Dr Whitney Muse in 4 weeks with cbc, cmp ...earlier if any concerns on todays labs  All of the patients questions were answered with apparent satisfaction. The patient knows to call the clinic with any problems, questions or concerns.  I spent 60 minutes counseling the patient face to face. The total time spent in the appointment was 60 minutes and more than 50% was on counseling and direct patient cares.    Sullivan Lone MD [place] AAHIVMS [facility] Hematology/Oncology Physician [facility]  (Office):       [phone] (Work cell):   [phone] (Fax):           [phone]  01/21/2016 10:18 AM

## 2016-01-22 LAB — FOLATE RBC
Folate, RBC: >1516 ng/mL (ref >498)
Hematocrit: 40.9 % (ref 37.5–51.0)

## 2016-01-22 LAB — ANTI-PARIETAL ANTIBODY: PARIETAL CELL ANTIBODY-IGG: 6.1 U (ref 0.0–20.0)

## 2016-01-22 LAB — KAPPA/LAMBDA LIGHT CHAINS
KAPPA, LAMDA LIGHT CHAIN RATIO: 1.44 (ref 0.26–1.65)
Kappa free light chain: 26 mg/L — ABNORMAL HIGH (ref 3.3–19.4)
LAMDA FREE LIGHT CHAINS: 18 mg/L (ref 5.7–26.3)

## 2016-01-22 LAB — INTRINSIC FACTOR ANTIBODIES: INTRINSIC FACTOR: 1.1 [AU]/ml (ref 0.0–1.1)

## 2016-01-23 DIAGNOSIS — M4856XA Collapsed vertebra, not elsewhere classified, lumbar region, initial encounter for fracture: Secondary | ICD-10-CM | POA: Diagnosis not present

## 2016-01-23 DIAGNOSIS — M9983 Other biomechanical lesions of lumbar region: Secondary | ICD-10-CM | POA: Diagnosis not present

## 2016-01-23 DIAGNOSIS — M4806 Spinal stenosis, lumbar region: Secondary | ICD-10-CM | POA: Diagnosis not present

## 2016-01-29 ENCOUNTER — Encounter: Payer: Self-pay | Admitting: Neurology

## 2016-01-29 ENCOUNTER — Ambulatory Visit (INDEPENDENT_AMBULATORY_CARE_PROVIDER_SITE_OTHER): Payer: Medicare Other | Admitting: Neurology

## 2016-01-29 VITALS — BP 144/66 | HR 60 | Ht 69.0 in | Wt 216.5 lb

## 2016-01-29 DIAGNOSIS — G7000 Myasthenia gravis without (acute) exacerbation: Secondary | ICD-10-CM

## 2016-01-29 DIAGNOSIS — D649 Anemia, unspecified: Secondary | ICD-10-CM

## 2016-01-29 DIAGNOSIS — I2782 Chronic pulmonary embolism: Secondary | ICD-10-CM

## 2016-01-29 NOTE — Progress Notes (Signed)
Chief Complaint  Patient presents with  . Myasthenia Gravis    He is here with his wife, [name].  He is having more difficulty swallowing and would like to discuss his treatments.     Chief Complaint  Patient presents with  . Myasthenia Gravis    He is here with his wife, [name].  He is having more difficulty swallowing and would like to discuss his treatments.      PATIENT: Calvin Sloan DOB: 1933-09-29  REASON FOR VISIT: follow up- myasthenia gravis HISTORY FROM: patient  HISTORY OF PRESENT ILLNESS HISTORY 02/18/15 [name]: Calvin Sloan is an 82-year-old right-handed white married male with generalized seropositive myasthenia gravis characterized by bulbar weakness, shortness of breath , and dysphagia. Patient of [name]  He was diagnosed in 10/2002 with a positive Tensilon test, positive acetylchoine receptor antibody,and had a positive ANA at 1-160 speckled pattern. Initially he had right eye ptosis and subsequently, double vision, fatigue with chewing, dysphagia, and upper and lower extremity weakness. He was initially treated with Mestinon bromide in June 2004, which was associated with GI side effects.  Prednisone was started in 03/29/2003 and CellCept 05/2003. He developed increased jitteriness, rash, and cramps on CellCept which was discontinued 10/2003. He had progressive bulbar weakness and was admitted 12/2003 for IV IgG therapy which required a PEG tube for dysphagia transiently. He was started on Imuran. 01/17/2004 he was readmitted for 2 days of IV IgG because of shortness of breath. He has not been hospitalized since 01/2004.   He has been slowly tapered off prednisone 09/2007 and was on Imuran and Mestinon bromide.   He had B12 deficiency and was receiving B12 shots ,but B12 levels off injections have been normal and B12 shots were discontinued.  He has a history of vague visual loss on the left of unknown etiology. He denies swallowing problems, speech problems, voice  changes, focal weakness, or double vision.  He is independent in activities of daily living and takes one nap per day.He denies memory loss but his wife states that he does have memory loss. His last blood studies in my office 05/27/10 were normal except a low white blood cell count of 3900.  His dermatologist [name] was concerned that the Imuran is associated with increased risk of skin cancer and the imuran was discontinued in 12/30/2010. His skin cancer (basal cell cancer) has improved since his imuran was stopped.  He has noted no change In myasthenic symptoms. His skin has improved. His medication for myasthenia is mesinon bromide 60 milligrams one and one half tablets 4 times a day. He has shortness of breath since his pulmonary emboli in 2003, also complains of excessive fatigue.   He is allergic to aspirin, GI side effect, gastric ulcer, and is on Plavix for a history of TIAs with right arm weakness He has swallowing problems at the beginning of the meal with liquids that improves as the meal continues.   He had right CTS release in May 2014 by [name].  He complains of shortness of breath with walking, bending over to tire his shoed, due to PE 2003, pulmnolgist folllowed up, he does not like to chew on tough beef, he denies swallowing difficulty, no double vision, no droopy eyelid, no significant bleeding muscle weakness. He is active in yard, shop, threadmill   UPDATE Jan 16th 2015: He is now tapering off Mestinon, there was no significant change in his functional status, he denies double vision, no  ptosis, continue complains of shortness of breath with exertion no significant and muscle weakness, he has to wash down his food sometimes, but no significant swelling, or chewing difficulty, no dysarthria  UPDATE July 16th 2015: He is no longer mestinon, he does complains of increased breathing difficulty from prolonged walking, no chewing difficulty, no double vision, no  ptosis.  Laboratory continue to demonstrate positive acetylcholine binding, and modulating antibodies. He did reported that his skin cancer has improved after stopping imuran, could not tolerate cellcept per record. Previously he responded to IVIG very well.  UPDATE Sept 25th 2015: He was stated on prednisone 10mg  qday, and mestinon 60mg  tid in June 2015, which did help his fatigue, shortness of breath, wife reported 50% improvement, he has increased appetite  But he was admitted to [facility] hospital in Sept 17th for bilateral PEs, right leg DVT, he is started on coumadin, bridge with Lovenox, [name] is monitoring his INR  He denies double vision, no ptosis, no limb muscle weakness, no dysphagia  UPDATE April 4th 2016: He is on coumadin for PE, INR was monitored by [name], his myasthenia gravis is under good control, is taking prednisone 10 mg every day, he has surgery in Nov 2015 for right leg cellulitis.  He only does little around his house, he has no double vision, SOB due to previous PE,   UPDATE Feb 18 2015: Previous laboratory evaluation showed mild B12 deficiency with level of 204, He did receive IM B12 supplement at his primary care Dr. [name] office for a while, now with normal repeat laboratory evaluation, injection has stopped He has mild choking difficulty, this has been going on since August 2016, not getting worse, he has more trouble with liquid. He complains of shortness of breath when bending over. He denies significant gait difficulty, no breathing difficulty otherwise, he denies double vision, no ptosis,, No chewing difficulties   UPDATE May 22 2015: He came in last visit April 16 2015 complains of worsening blurry vision, double vision, swallowing difficulties, trouble talking, he reported 50% weakness compared to baseline, difficulty getting up from seated position, difficulty chewing, worsening shortness of breath, on examination, he was noted to have  increased weakness compared to previous examination, he had moderate eye-closure cheek puff, mild neck flexion weakness, mild bilateral shoulder abduction bilateral hip flexion weakness.  He was treated with IVIG 400 mg/kg every day for 5 days from December 11 to May 02 2015, his vision has much improved, he can walk better, but he continue have swallowing difficulty, dysarthria, he is also taking prednisone 40 mg every day, increase his Mestinon 60 mg to 3-4 tablets daily  He was not able to tolerate immunosuppression treatment in the past due to skin cancer  UPDATE Jul 07 2015:  I have called his wife, pulmonary function test in June 05 2015.  1, FVC is 3.1 5, 87%, this is normal 2. FEV1 is 2.1 6, 77%, with a percent FEV1 of 68, this indicated mild to moderate primary small  obstruction in this patient was a history of cigarette smoking, large elderly flow rates are generally normal  3. Flow volume loop is unremarkable 4, lung volumes indicate some evidence of air trapping with an increased residual volume, the TLC is normal, the increased residual volume confirms the presence of airflow obstruction  5, DLCO is 19.0, 90%, this is normal 6, elderly resistant 3.7 2, 265%, this is increased  Wife reported, he recently had swallowing study, there was no  significant abnormality found, his swallowing, breathing, symptoms overall has improved  He did think that IVIG treatment in Dec 11-16th 2016 has helped his blurry vision, it is gone, he still feel straggled sometimes, he has shortness with exertion and bending over. His talking is much better.  UPDATE May 22nd 2017: He continue complains of shortness of breath with minimum exertion, no diplopia, no double vision, mild dysphasia,  He had a history of right carpal tunnel release surgery in the past, which has been helpful, now complains of left first 3 finger paresthesia, EMG nerve conduction studies was planned at local  hospital  UPDATE July 6th 2017: He stopped taking Mestinon around May 20 third 2017, shortly afterwards, he noticed double vision, he clearly describe eighth of binocular double vision, sometimes vertigo, sometimes horizontal, he went on higher dose of Mestinon 60 mg 4 times a day without improving his symptoms, he slept on a bed with 30 head raise, with no significant breathing difficulty, he could not eat his dinner on July 5th 2017 due to chewing swallowing difficulty, he could not fly flat sleeping, mildly unsteady gait, increased fatigue,  UPDATE August 17th 2017: Last IVIG treatment was December 05 2015, he did very well for a while, was able to cut grass, trim his lawn, it did helped him for at least 2 weeks, then he had passing out   I reviewed laboratory evaluation in August 2017, normal B12, ferritin level, iron binding capacity was within normal limit, low hemoglobin 10 point 8, creatinine was elevated 1.53, glucose was 125  Previous laboratory evaluation in 2016 showed normal folic acid 12 point 5,low normal B12 level 266, hemoglobin in January 2015 was 14 point 3  He will be evaluated by hematologist/oncologist Dr. Mervin Kung in September 6th 2017, at [facility]  He complains of low back pain since he fell you August 1st 2017, has been taking intermittent Tylenol,  He complains of difficulty swallowing, both liquid, and solid food, no double vision, He is taking mestinon 60mg  4 times a day, which did help him.  Update January 29 2016: I have reviewed his hematologist Dr. Grier Mitts note on September 6th 2017, normocytic microchromic anemia, normal WBC, platelet count, likely from chronic kidney disease, history of fetal deficiency, is on B12 supplement, there was a consideration of low level hemolytic anemia from IVIG infusion, no overt evidence of blood loss  I reviewed laboratory evaluation in September 2017, parietal cell antibody IgG was negative, intrinsic factor negative, mild  elevated kappa free light chain, Hg 13.8, creat 1.38, GFR 47, ferritin 163, normal TSh 1.5, elevated vitamin B12 1480  He complains more fatigue, more swallowing difficulty, decreased po intake, he could not drinking water because worry about the choking episode, increased shortness of breath with exertion, intermittent blurry version, difficulty closing his eyes.  He is now taking mestinon 60mg  qid, no significant side effect, was not sure about the benefit.  Also reviewed swallowing evaluation on June 06 2015 at [facility], there was mild oropharyngeal dysphagia, characterized by spillover to the valleculae and the piriform sinus secondary to reduced oral motor coordination   patient was felt to safe to consume a regular diet with a full range of liquid with  recommendation of upright for food by mouth, and 45 minutes after meals, head flexed slightly forward , swallow at slower rate  REVIEW OF SYSTEMS: Out of a complete 14 system review of symptoms, the patient complains only of the following symptoms, and all  other reviewed systems,  are negative Double vision, blurred vision ALLERGIES: Allergies  Allergen Reactions  . Aspirin   . Shellfish Allergy     HOME MEDICATIONS: Outpatient Medications Prior to Visit  Medication Sig Dispense Refill  . pyridostigmine (MESTINON) 60 MG tablet TAKE 1 TABLET FOUR TIMES DAILY 360 tablet 3  . ranitidine (ZANTAC) 75 MG tablet TAKE 1 TABLET TWICE DAILY 180 tablet 3  . valsartan-hydrochlorothiazide (DIOVAN-HCT) 320-25 MG per tablet Take 1 tablet by mouth daily.    [name] warfarin (COUMADIN) 5 MG tablet 5 mg. Taking 5mg  Mon, Wed, Fri.  Taking 2.5mg  Sun, Tues, Thurs, Sat.     No facility-administered medications prior to visit.     PAST MEDICAL HISTORY: Past Medical History:  Diagnosis Date  . B12 deficiency   . Essential tremor   . Hypertension   . Memory deficit   . Myasthenia gravis ([place])    since 2004  . Pulmonary emboli ([place])     in Nov 2003  . Skin cancer     PAST SURGICAL HISTORY: Past Surgical History:  Procedure Laterality Date  . CARPAL TUNNEL RELEASE Right 09/14/2012   Procedure: CARPAL TUNNEL RELEASE;  Surgeon: Cammie Sickle., MD;  Location: [facility];  Service: Orthopedics;  Laterality: Right;  . CARPAL TUNNEL RELEASE Left 11/04/2015   Procedure: LEFT CARPAL TUNNEL RELEASE, EXTENDED INCISION;  Surgeon: Daryll Brod, MD;  Location: [facility];  Service: Orthopedics;  Laterality: Left;  ANESTHESIA: IV REGIONAL UPPER ARM  . CHOLECYSTECTOMY  2012   [facility]  . COLONOSCOPY    . FOOT SURGERY  1966   lt orif foot  . SKIN CANCER EXCISION      FAMILY HISTORY: Family History  Problem Relation Age of Onset  . Heart disease Mother   . Heart disease Father     SOCIAL HISTORY: Social History   Social History  . Marital status: Married    Spouse name: [name]  . Number of children: 2  . Years of education: 12   Occupational History  . Plant     Retired   Social History Main Topics  . Smoking status: Former Smoker    Quit date: 05/17/1968  . Smokeless tobacco: Never Used  . Alcohol use No  . Drug use: No  . Sexual activity: Not on file   Other Topics Concern  . Not on file   Social History Narrative   He lives with his wife [name](, has 2 children, retired. Hard of hearing.   Right handed.   Caffeine- None      PHYSICAL EXAM  Vitals:   01/29/16 1347  BP: (!) 144/66  Pulse: 60  Weight: 216 lb 8 oz (98.2 kg)  Height: 5\' 9"  (1.753 m)   Body mass index is 31.97 kg/m.   PHYSICAL EXAMNIATION:  Gen: NAD, conversant, well nourised, obese, well groomed                     Cardiovascular: Regular rate rhythm, no peripheral edema, warm, nontender. Eyes: Conjunctivae clear without exudates or hemorrhage Neck: Supple, no carotid bruise. Pulmonary: Clear to auscultation bilaterally   NEUROLOGICAL EXAM:  MENTAL STATUS: Speech:    Speech is normal; fluent  and spontaneous with normal comprehension.  Cognition:     Orientation to time, place and person     Normal recent and remote memory     Normal Attention span and concentration     Normal Language, naming,  repeating,spontaneous speech     Fund of knowledge   CRANIAL NERVES: CN II: Visual fields are full to confrontation. Fundoscopic exam is normal with sharp discs and no vascular changes. Pupils are round equal and briskly reactive to light. CN III, IV, VI: extraocular movement are normal. Fatigable ptosis, cover and uncover testing showed bilateral exophoria CN V: Facial sensation is intact to pinprick in all 3 divisions bilaterally. Corneal responses are intact.  CN VII: He has Moderate eye closure, cheek puff weakness. CN VIII: Hearing is normal to rubbing fingers CN IX, X: Palate elevates symmetrically. Phonation is normal. CN XI: Head turning and shoulder shrug are intact CN XII: Tongue is midline with normal movements and no atrophy.  MOTOR:  He has mild neck flexion, there was mild bilateral shoulder abduction, external rotation, mild bilateral hip flexion weakness  REFLEXES: Reflexes are 2+ and symmetric at the biceps, triceps, knees, and ankles. Plantar responses are flexor.  SENSORY: Intact to light touch, pinprick, position sense, and vibration sense are intact in fingers and toes.  COORDINATION: Rapid alternating movements and fine finger movements are intact. There is no dysmetria on finger-to-nose and heel-knee-shin.    GAIT/STANCE: He is able to get up from seated position arm crossed but with effort, But could not get up from low seated position, need to push on, mildly unsteady, difficulty to perform tandem, tiptoe tandem walking  DIAGNOSTIC DATA (LABS, IMAGING, TESTING) - I reviewed patient records, labs, notes, testing and imaging myself where available.   ASSESSMENT AND PLAN 82 y.o. year old male   Myasthenia gravis with exacerbation  with dysphagia, mild  breathing difficulty, mild to moderate bulbar limb muscle weakness, worsening double vision, ptosis  He responded very well to previous IVIG treatment, most recent one was in December 05 2015,  Not a candidate for long term immunosuppressive treatment due to her recurrent large area skin cancer  Repeat IVIG 2 g/kg= 0.4 g/kgx5 5 days, then maintenance dose 1 g/kg divided into 2 or 3 days infusion, repeat 5 times  Continue Mestinon 60 mg 4 times a day    Shortness of breath  Refer him to pulmonary function test, showed evidence of small airway construction,  Multifactorial, weakness due to myasthenia gravis exacerbation, history of PE, small airway construction,  Continue Coumadin treatment for pulmonary emboli  New onset anemia  Is under the care of hematologist Dr. Velvet Bathe, most recent hemoglobin 13 point 8,    most consistent with his chronic kidney disease, could not rule out small hemolytic anemia triggered by IVIG infusion  Will monitor blood count closely following ivig infusion       Marcial Pacas, M.D. Ph.D.  [facility] [address], [place], [place] [address] [phone]

## 2016-02-09 DIAGNOSIS — G7001 Myasthenia gravis with (acute) exacerbation: Secondary | ICD-10-CM | POA: Diagnosis not present

## 2016-02-10 DIAGNOSIS — G7001 Myasthenia gravis with (acute) exacerbation: Secondary | ICD-10-CM | POA: Diagnosis not present

## 2016-02-11 DIAGNOSIS — G7001 Myasthenia gravis with (acute) exacerbation: Secondary | ICD-10-CM | POA: Diagnosis not present

## 2016-02-12 DIAGNOSIS — G7001 Myasthenia gravis with (acute) exacerbation: Secondary | ICD-10-CM | POA: Diagnosis not present

## 2016-02-13 DIAGNOSIS — G7001 Myasthenia gravis with (acute) exacerbation: Secondary | ICD-10-CM | POA: Diagnosis not present

## 2016-02-20 ENCOUNTER — Encounter (HOSPITAL_COMMUNITY): Payer: Medicare Other

## 2016-02-20 ENCOUNTER — Encounter (HOSPITAL_COMMUNITY): Payer: Medicare Other | Attending: Hematology & Oncology | Admitting: Hematology & Oncology

## 2016-02-20 VITALS — BP 143/61 | HR 64 | Temp 97.8°F | Resp 18 | Wt 216.8 lb

## 2016-02-20 DIAGNOSIS — Z7901 Long term (current) use of anticoagulants: Secondary | ICD-10-CM | POA: Diagnosis not present

## 2016-02-20 DIAGNOSIS — G7000 Myasthenia gravis without (acute) exacerbation: Secondary | ICD-10-CM | POA: Diagnosis not present

## 2016-02-20 DIAGNOSIS — D649 Anemia, unspecified: Secondary | ICD-10-CM

## 2016-02-20 DIAGNOSIS — N183 Chronic kidney disease, stage 3 unspecified: Secondary | ICD-10-CM

## 2016-02-20 DIAGNOSIS — D518 Other vitamin B12 deficiency anemias: Secondary | ICD-10-CM

## 2016-02-20 LAB — CBC WITH DIFFERENTIAL/PLATELET
BASOS ABS: 0.1 10*3/uL (ref 0.0–0.1)
BASOS PCT: 1 %
EOS ABS: 0.1 10*3/uL (ref 0.0–0.7)
Eosinophils Relative: 2 %
HCT: 33.1 % — ABNORMAL LOW (ref 39.0–52.0)
Hemoglobin: 11.7 g/dL — ABNORMAL LOW (ref 13.0–17.0)
Lymphocytes Relative: 24 %
Lymphs Abs: 1.2 10*3/uL (ref 0.7–4.0)
MCH: 32.1 pg (ref 26.0–34.0)
MCHC: 35.3 g/dL (ref 30.0–36.0)
MCV: 90.9 fL (ref 78.0–100.0)
MONO ABS: 0.5 10*3/uL (ref 0.1–1.0)
MONOS PCT: 11 %
Neutro Abs: 3.1 10*3/uL (ref 1.7–7.7)
Neutrophils Relative %: 62 %
PLATELETS: 157 10*3/uL (ref 150–400)
RBC: 3.64 MIL/uL — ABNORMAL LOW (ref 4.22–5.81)
RDW: 13.9 % (ref 11.5–15.5)
WBC: 5 10*3/uL (ref 4.0–10.5)

## 2016-02-20 LAB — RETICULOCYTES
RBC.: 3.64 MIL/uL — AB (ref 4.22–5.81)
RETIC CT PCT: 3.5 % — AB (ref 0.4–3.1)
Retic Count, Absolute: 127.4 10*3/uL (ref 19.0–186.0)

## 2016-02-20 NOTE — Progress Notes (Signed)
HEMATOLOGY/ONCOLOGY CONSULTATION NOTE  Date of Service: 02/20/2016  Patient Care Team: Calvin Percy, MD as PCP - General (Family Medicine)  CHIEF COMPLAINTS/PURPOSE OF CONSULTATION:  Anemia  HISTORY OF PRESENTING ILLNESS:   Calvin Sloan is a wonderful 82 y.o. male who has been referred to [place] by Dr .Calvin Percy, MD for evaluation and management of Anemia.  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  Mr. [name] is accompanied by his wife. I personally reviewed and went over laboratory studies with the patient.  He received his last IVIG infusion almost 3 weeks ago. His wife remarks it'll be 3 weeks on Monday. He receives IVIG for his myasthenia.   He has been limited on his activity by his myasthenia gravis.   He has a good appetite.   He complains of back pain associated with prior fall. He fell secondary to fainting after weed eating outside. He has a cane and walker but he does not use them.  He does not like having to follow at two separate hospitals. He would prefer all of his care in [place] or [place]. We discussed how [facility] no longer has any "blood doctors" and his current physician referred him to [place] because he felt his anemia needed further workup.Calvin Sloan His wife understands this. The patient states, "You need to get another one" in reference to getting another hematologist in [facility].  Denies any changes since his last visit.  MEDICAL HISTORY:  Past Medical History:  Diagnosis Date  . B12 deficiency   . Essential tremor   . Hypertension   . Memory deficit   . Myasthenia gravis ([place])    since 2004  . Pulmonary emboli ([place])    in Nov 2003  . 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   SURGICAL HISTORY: Past Surgical History:  Procedure Laterality Date  . CARPAL TUNNEL RELEASE Right 09/14/2012   Procedure: CARPAL TUNNEL RELEASE;  Surgeon: Cammie Sickle., MD;  Location: [facility];  Service: Orthopedics;  Laterality: Right;  . CARPAL TUNNEL RELEASE Left 11/04/2015   Procedure: LEFT CARPAL TUNNEL RELEASE, EXTENDED INCISION;  Surgeon: Daryll Brod, MD;  Location: [facility];  Service: Orthopedics;  Laterality: Left;  ANESTHESIA: IV REGIONAL UPPER ARM  . CHOLECYSTECTOMY  2012   [facility]  . COLONOSCOPY    . FOOT SURGERY  1966   lt orif foot  . SKIN CANCER EXCISION      SOCIAL HISTORY: Social History   Social History  . Marital status: Married    Spouse name: Calvin Sloan  . Number of children: 2  . Years of education: 12   Occupational History  . Plant     Retired   Social History Main Topics  . Smoking status: Former Smoker    Quit date: 05/17/1968  . Smokeless tobacco: Never Used  . Alcohol use No  . Drug use: No  . Sexual activity: Not on file   Other Topics Concern  . Not on file   Social History Narrative   He lives with his wife Calvin Sloan(, has 2 children, retired. Hard of hearing.  Right handed.   Caffeine- None    FAMILY HISTORY: Family History  Problem Relation Age of Onset  . Heart disease Mother   . Heart disease Father     ALLERGIES:  is allergic to aspirin and shellfish allergy.  MEDICATIONS:  Current Outpatient Prescriptions  Medication Sig Dispense Refill  . pyridostigmine (MESTINON) 60 MG tablet TAKE 1 TABLET FOUR TIMES DAILY 360 tablet 3  . ranitidine (ZANTAC) 75 MG tablet TAKE 1 TABLET TWICE DAILY 180 tablet 3  . valsartan-hydrochlorothiazide (DIOVAN-HCT) 320-25 MG per tablet Take 1 tablet by mouth daily.    Calvin Sloan warfarin (COUMADIN) 5 MG tablet 5 mg. Taking 5mg  Mon, Wed, Fri.   Taking 2.5mg  Sun, Tues, Thurs, Sat.     No current facility-administered medications for this visit.     REVIEW OF SYSTEMS:   Review of Systems  Constitutional: Negative.   HENT: Negative.   Eyes: Negative.   Respiratory: Negative.   Cardiovascular: Negative.   Genitourinary: Negative.   Musculoskeletal: Positive for back pain and falls.       Back pain secondary to prior fall  Skin: Negative.   Neurological: Negative.   Endo/Heme/Allergies: Negative.   Psychiatric/Behavioral: Negative.   All other systems reviewed and are negative. 14 point review of systems was performed and is negative except as detailed under history of present illness and above   PHYSICAL EXAMINATION: ECOG PERFORMANCE STATUS: 2 - Symptomatic, <50% confined to bed    Vitals:   02/20/16 1140  BP: (!) 143/61  Pulse: 64  Resp: 18  Temp: 97.8 F (36.6 C)   Filed Weights   02/20/16 1140  Weight: 216 lb 12.8 oz (98.3 kg)   .Body mass index is 32.02 kg/m.  Physical Exam  Constitutional: He is oriented to person, place, and time.  HENT:  Head: Normocephalic and atraumatic.  Mouth/Throat: Oropharynx is clear and moist.  Eyes: Conjunctivae and EOM are normal. Pupils are equal, round, and reactive to light.  Neck: Normal range of motion. Neck supple.  Cardiovascular: Normal rate, regular rhythm and normal heart sounds.   Pulmonary/Chest: Effort normal and breath sounds normal.  Abdominal: Soft. Bowel sounds are normal.  Musculoskeletal: Normal range of motion.  Neurological: He is alert and oriented to person, place, and time.  Skin: Skin is warm and dry.  Nursing note and vitals reviewed.   LABORATORY DATA:  I have reviewed the data as listed  . CBC Latest Ref Rng & Units 02/20/2016 01/21/2016 01/21/2016  WBC 4.0 - 10.5 K/uL 5.0 5.5 -  Hemoglobin 13.0 - 17.0 g/dL 11.7(L) 13.8 -  Hematocrit 39.0 - 52.0 % 33.1(L) 41.5 40.9  Platelets 150 - 400 K/uL 157 193 -    . CMP Latest Ref Rng & Units  01/21/2016 10/30/2015 08/19/2014  Glucose 65 - 99 mg/dL 94 181(H) 124(H)  BUN 6 - 20 mg/dL 13 14 18   Creatinine 0.61 - 1.24 mg/dL 1.38(H) 1.50(H) 1.13  Sodium 135 - 145 mmol/L 139 135 140  Potassium 3.5 - 5.1 mmol/L 4.4 3.2(L) 4.9  Chloride 101 - 111 mmol/L 102 103 98  CO2 22 - 32 mmol/L 30 25 24   Calcium 8.9 - 10.3 mg/dL 9.7 9.0 9.5  Total Protein 6.5 - 8.1 g/dL 7.8 - 6.8  Total Bilirubin 0.3 - 1.2 mg/dL 0.8 - 0.4  Alkaline Phos 38 - 126 U/L 71 - 50  AST 15 - 41 U/L 27 - 24  ALT 17 - 63 U/L 21 - 27   Results for  JAHLANI, MURRIE (MRN [id number])   Ref. Range 01/21/2016 11:09 01/21/2016 11:10  LDH Latest Ref Range: 98 - 192 U/L  148  Iron Latest Ref Range: 45 - 182 ug/dL 94   UIBC Latest Units: ug/dL 213   TIBC Latest Ref Range: 250 - 450 ug/dL 307   Saturation Ratios Latest Ref Range: 17.9 - 39.5 % 31   Ferritin Latest Ref Range: 24 - 336 ng/mL 163   Intrinsic Factor Latest Ref Range: 0.0 - 1.1 AU/mL  1.1  Folate, Hemolysate Latest Ref Range: Not Estab. ng/mL >620.0   Folate, RBC Latest Ref Range: >498 ng/mL >1,516   Vitamin B12 Latest Ref Range: 180 - 914 pg/mL 1,480 (H)     RADIOGRAPHIC STUDIES: I have personally reviewed the radiological images as listed and agreed with the findings in the report. No results found.  ASSESSMENT & PLAN:  Anemia, multifactorial Myasthenia gravis B12 deficiency, on oral B12 CKD, stage 3 Chronic Anticoagulation History of recurrent VTE  82 year old Caucasian male with  #1 normocytic normochromic anemia. Normal WBC and platelet counts. Patient will 10.8 and MCV of 94 based on outside outside labs from 12/18/2015. This could likely be from his chronic kidney disease. He has had a history of B12 deficiency and is on B12 oral replacement at this time. Has had a history of myasthenia gravis since 2004. Was in remission for a long time and had relapse over the last 1-2 years. Currently was restarted on IVIG which can cause some low-level hemolytic  anemia. Myasthenia gravis can also be associated with pure red cell aplasia - less likely at this time. Given his history of B12 deficiency and myasthenia gravis cannot rule out the possibility of autoimmune thyroiditis related hypothyroidism. No overt evidence of blood loss  #2 History of recurrent VTE - on chronic anticoagulation -monitored by PCP  #3 History of myasthenia gravis - currently in relapse started on IVIG and is on Mestinon. Follows with [facility] for neurology.  #4 chronic kidney disease #5 hypertension  Plan -Any follow-up with primary care physician for other medical comorbidities  Labs reviewed. HGB is 11.7 today. WBC is 5.0 today. Platelets 157 today. I have added SPEP-IEP to today's labs, we will call with these results.   We will continue to monitor his labs and not pursue BMBX at this time The most likely cause of his anemia is multifactorial including IVIG, CKD, chronic anticoaugulation (? Intermittent GI related blood loss, currently iron levels excellent will follow). No need for ESA therapy currently   He is scheduled to follow up with neurologist Dr. [name] on 03/01/2016.  He will return for follow up in 3 months with blood work including, CBC Diff CMET LDH and Comp. If he continues to have stable counts, we will move his visits out to 6 month intervals.   All of the patients questions were answered with apparent satisfaction. The patient knows to call the clinic with any problems, questions or concerns.  This document serves as a record of services personally performed by Ancil Linsey, MD. It was created on her behalf by Arlyce Harman, a trained medical scribe. The creation of this record is based on the scribe's personal observations and the provider's statements to them. This document has been checked and approved by the attending provider.  I have reviewed the above documentation for accuracy and completeness and I agree with the above.  Kelby Fam. Penland,  MD  02/20/2016 11:53 AM

## 2016-02-20 NOTE — Patient Instructions (Addendum)
[place] at [facility] Discharge Instructions  RECOMMENDATIONS MADE BY THE CONSULTANT AND ANY TEST RESULTS WILL BE SENT TO YOUR REFERRING PHYSICIAN.  Dr. Donald Pore recommendation is for [place] to watch your blood counts for now.   Your blood counts are relatively normal. Your hemoglobin is 11.7. We will continue to watch.   A couple of your lab tests were ordered in the past but not collected so we are adding on the two tests that Dr. Whitney Muse wants drawn to your blood work drawn today. It will take a few days for these labs to come back.   Return to see Dr. Whitney Muse in 3 months with labs.       Thank you for choosing [place] at [facility] to provide your oncology and hematology care.  To afford each patient quality time with our provider, please arrive at least 15 minutes before your scheduled appointment time.   Beginning January 23rd 2017 lab work for the [facility] will be done in the  Main lab at [facility] on 1st floor. If you have a lab appointment with the [facility] please come in thru the  Main Entrance and check in at the main information desk  You need to re-schedule your appointment should you arrive 10 or more minutes late.  We strive to give you quality time with our providers, and arriving late affects you and other patients whose appointments are after yours.  Also, if you no show three or more times for appointments you may be dismissed from the clinic at the providers discretion.     Again, thank you for choosing [facility].  Our hope is that these requests will decrease the amount of time that you wait before being seen by our physicians.       _____________________________________________________________  Should you have questions after your visit to [facility], please contact our office at [phone] between the hours of 8:30 a.m. and 4:30 p.m.  Voicemails left after 4:30 p.m. will  not be returned until the following business day.  For prescription refill requests, have your pharmacy contact our office.         Resources For Cancer Patients and their Caregivers ? American Cancer Society: Can assist with transportation, wigs, general needs, runs Look Good Feel Better.        6108619276 ? Cancer Care: Provides financial assistance, online support groups, medication/co-pay assistance.  1-800-813-HOPE 903-487-2283) ? [place] Assists [place] cancer patients and their families through emotional , educational and financial support.  (854)133-0741 ? [place] DSS Where to apply for food stamps, Medicaid and utility assistance. (713) 071-4470 ? [facility]: Transportation to medical appointments. 531-023-7485 ? Social Security Administration: May apply for disability if have a Stage IV cancer. 667-544-1961 567-743-0319 ? [employer], Disability and Transit Services: Assists with nutrition, care and transit needs. [place] Support Programs: @10RELATIVEDAYS @ > Cancer Support Group  2nd Tuesday of the month 1pm-2pm, Journey Room  > Creative Journey  3rd Tuesday of the month 1130am-1pm, Journey Room  > Look Good Feel Better  1st Wednesday of the month 10am-12 noon, Journey Room (Call [place] to register [phone])

## 2016-02-22 ENCOUNTER — Encounter (HOSPITAL_COMMUNITY): Payer: Self-pay | Admitting: Hematology & Oncology

## 2016-02-23 LAB — PROTEIN ELECTROPHORESIS, SERUM
A/G RATIO SPE: 0.8 (ref 0.7–1.7)
ALBUMIN ELP: 3.4 g/dL (ref 2.9–4.4)
ALPHA-1-GLOBULIN: 0.2 g/dL (ref 0.0–0.4)
Alpha-2-Globulin: 0.5 g/dL (ref 0.4–1.0)
Beta Globulin: 0.9 g/dL (ref 0.7–1.3)
GAMMA GLOBULIN: 2.8 g/dL — AB (ref 0.4–1.8)
Globulin, Total: 4.4 g/dL — ABNORMAL HIGH (ref 2.2–3.9)
TOTAL PROTEIN ELP: 7.8 g/dL (ref 6.0–8.5)

## 2016-02-24 DIAGNOSIS — I2699 Other pulmonary embolism without acute cor pulmonale: Secondary | ICD-10-CM | POA: Diagnosis not present

## 2016-02-24 LAB — IMMUNOFIXATION ELECTROPHORESIS
IGG (IMMUNOGLOBIN G), SERUM: 2976 mg/dL — AB (ref 700–1600)
IgA: 137 mg/dL (ref 61–437)
IgM, Serum: 43 mg/dL (ref 15–143)
Total Protein ELP: 7.8 g/dL (ref 6.0–8.5)

## 2016-02-27 ENCOUNTER — Encounter (HOSPITAL_COMMUNITY): Payer: Self-pay | Admitting: Hematology & Oncology

## 2016-02-27 DIAGNOSIS — S22000A Wedge compression fracture of unspecified thoracic vertebra, initial encounter for closed fracture: Secondary | ICD-10-CM | POA: Diagnosis not present

## 2016-02-27 DIAGNOSIS — N183 Chronic kidney disease, stage 3 unspecified: Secondary | ICD-10-CM | POA: Insufficient documentation

## 2016-02-27 DIAGNOSIS — S39012A Strain of muscle, fascia and tendon of lower back, initial encounter: Secondary | ICD-10-CM | POA: Diagnosis not present

## 2016-03-01 ENCOUNTER — Ambulatory Visit (INDEPENDENT_AMBULATORY_CARE_PROVIDER_SITE_OTHER): Payer: Medicare Other | Admitting: Neurology

## 2016-03-01 ENCOUNTER — Encounter: Payer: Self-pay | Admitting: Neurology

## 2016-03-01 VITALS — BP 137/71 | HR 63 | Ht 69.0 in | Wt 214.0 lb

## 2016-03-01 DIAGNOSIS — R1310 Dysphagia, unspecified: Secondary | ICD-10-CM | POA: Diagnosis not present

## 2016-03-01 DIAGNOSIS — D649 Anemia, unspecified: Secondary | ICD-10-CM | POA: Diagnosis not present

## 2016-03-01 DIAGNOSIS — G7000 Myasthenia gravis without (acute) exacerbation: Secondary | ICD-10-CM | POA: Diagnosis not present

## 2016-03-01 NOTE — Progress Notes (Signed)
Chief Complaint  Patient presents with  . Myasthenia Gravis    He is here with his son, Corene Cornea.  His most recent IVIG was 10/2-17 through 02/20/16.  His swallowing is better but he is still feeling weak.     Chief Complaint  Patient presents with  . Myasthenia Gravis    He is here with his son, Corene Cornea.  His most recent IVIG was 10/2-17 through 02/20/16.  His swallowing is better but he is still feeling weak.      PATIENT: Calvin Sloan DOB: 10/14/1933  REASON FOR VISIT: follow up- myasthenia gravis HISTORY FROM: patient  HISTORY OF PRESENT ILLNESS HISTORY 02/18/15 [name]: AXIL NARD is a 82 -year-old right-handed white married male with generalized seropositive myasthenia gravis characterized by bulbar weakness, shortness of breath , and dysphagia. Patient of [name]  He was diagnosed in 10/2002 with a positive Tensilon test, positive acetylchoine receptor antibody,and had a positive ANA at 1-160 speckled pattern. Initially he had right eye ptosis and subsequently, double vision, fatigue with chewing, dysphagia, and upper and lower extremity weakness. He was initially treated with Mestinon bromide in June 2004, which was associated with GI side effects.  Prednisone was started in 03/29/2003 and CellCept 05/2003. He developed increased jitteriness, rash, and cramps on CellCept which was discontinued 10/2003. He had progressive bulbar weakness and was admitted 12/2003 for IV IgG therapy which required a PEG tube for dysphagia transiently. He was started on Imuran. 01/17/2004 he was readmitted for 2 days of IV IgG because of shortness of breath. He has not been hospitalized since 01/2004.   He has been slowly tapered off prednisone 09/2007 and was on Imuran and Mestinon bromide.   He had B12 deficiency and was receiving B12 shots ,but B12 levels off injections have been normal and B12 shots were discontinued.  He has a history of vague visual loss on the left of unknown etiology. He denies  swallowing problems, speech problems, voice changes, focal weakness, or double vision.  He is independent in activities of daily living and takes one nap per day.He denies memory loss but his wife states that he does have memory loss. His last blood studies in my office 05/27/10 were normal except a low white blood cell count of 3900.  His dermatologist [name] was concerned that the Imuran is associated with increased risk of skin cancer and the imuran was discontinued in 12/30/2010. His skin cancer (basal cell cancer) has improved since his imuran was stopped.  He has noted no change In myasthenic symptoms. His skin has improved. His medication for myasthenia is mesinon bromide 60 milligrams one and one half tablets 4 times a day. He has shortness of breath since his pulmonary emboli in 2003, also complains of excessive fatigue.   He is allergic to aspirin, GI side effect, gastric ulcer, and is on Plavix for a history of TIAs with right arm weakness He has swallowing problems at the beginning of the meal with liquids that improves as the meal continues.   He had right CTS release in May 2014 by [name].  He complains of shortness of breath with walking, bending over to tire his shoed, due to PE 2003, pulmnolgist folllowed up, he does not like to chew on tough beef, he denies swallowing difficulty, no double vision, no droopy eyelid, no significant bleeding muscle weakness. He is active in yard, shop, threadmill   UPDATE Jan 16th 2015: He is now tapering off Mestinon, there was  no significant change in his functional status, he denies double vision, no ptosis, continue complains of shortness of breath with exertion no significant and muscle weakness, he has to wash down his food sometimes, but no significant swelling, or chewing difficulty, no dysarthria  UPDATE July 16th 2015: He is no longer mestinon, he does complains of increased breathing difficulty from prolonged walking, no  chewing difficulty, no double vision, no ptosis.  Laboratory continue to demonstrate positive acetylcholine binding, and modulating antibodies. He did reported that his skin cancer has improved after stopping imuran, could not tolerate cellcept per record. Previously he responded to IVIG very well.  UPDATE Sept 25th 2015: He was stated on prednisone 10mg  qday, and mestinon 60mg  tid in June 2015, which did help his fatigue, shortness of breath, wife reported 50% improvement, he has increased appetite  But he was admitted to [facility] hospital in Sept 17th for bilateral PEs, right leg DVT, he is started on coumadin, bridge with Lovenox, [name] is monitoring his INR  He denies double vision, no ptosis, no limb muscle weakness, no dysphagia  UPDATE April 4th 2016: He is on coumadin for PE, INR was monitored by [name], his myasthenia gravis is under good control, is taking prednisone 10 mg every day, he has surgery in Nov 2015 for right leg cellulitis.  He only does little around his house, he has no double vision, SOB due to previous PE,   UPDATE Feb 18 2015: Previous laboratory evaluation showed mild B12 deficiency with level of 204, He did receive IM B12 supplement at his primary care Dr. [name] office for a while, now with normal repeat laboratory evaluation, injection has stopped He has mild choking difficulty, this has been going on since August 2016, not getting worse, he has more trouble with liquid. He complains of shortness of breath when bending over. He denies significant gait difficulty, no breathing difficulty otherwise, he denies double vision, no ptosis,, No chewing difficulties   UPDATE May 22 2015: He came in last visit April 16 2015 complains of worsening blurry vision, double vision, swallowing difficulties, trouble talking, he reported 50% weakness compared to baseline, difficulty getting up from seated position, difficulty chewing, worsening shortness of breath,  on examination, he was noted to have increased weakness compared to previous examination, he had moderate eye-closure cheek puff, mild neck flexion weakness, mild bilateral shoulder abduction bilateral hip flexion weakness.  He was treated with IVIG 400 mg/kg every day for 5 days from December 11 to May 02 2015, his vision has much improved, he can walk better, but he continue have swallowing difficulty, dysarthria, he is also taking prednisone 40 mg every day, increase his Mestinon 60 mg to 3-4 tablets daily  He was not able to tolerate immunosuppression treatment in the past due to skin cancer  UPDATE Jul 07 2015:  I have called his wife, pulmonary function test in June 05 2015.  1, FVC is 3.1 5, 87%, this is normal 2. FEV1 is 2.1 6, 77%, with a percent FEV1 of 68, this indicated mild to moderate primary small  obstruction in this patient was a history of cigarette smoking, large elderly flow rates are generally normal  3. Flow volume loop is unremarkable 4, lung volumes indicate some evidence of air trapping with an increased residual volume, the TLC is normal, the increased residual volume confirms the presence of airflow obstruction  5, DLCO is 19.0, 90%, this is normal 6, elderly resistant 3.7 2, 265%, this is  increased  Wife reported, he recently had swallowing study, there was no significant abnormality found, his swallowing, breathing, symptoms overall has improved  He did think that IVIG treatment in Dec 11-16th 2016 has helped his blurry vision, it is gone, he still feel straggled sometimes, he has shortness with exertion and bending over. His talking is much better.  UPDATE May 22nd 2017: He continue complains of shortness of breath with minimum exertion, no diplopia, no double vision, mild dysphasia,  He had a history of right carpal tunnel release surgery in the past, which has been helpful, now complains of left first 3 finger paresthesia, EMG nerve conduction studies  was planned at local hospital  UPDATE July 6th 2017: He stopped taking Mestinon around May 20 third 2017, shortly afterwards, he noticed double vision, he clearly describe eighth of binocular double vision, sometimes vertigo, sometimes horizontal, he went on higher dose of Mestinon 60 mg 4 times a day without improving his symptoms, he slept on a bed with 30 head raise, with no significant breathing difficulty, he could not eat his dinner on July 5th 2017 due to chewing swallowing difficulty, he could not fly flat sleeping, mildly unsteady gait, increased fatigue,  UPDATE August 17th 2017: Last IVIG treatment was December 05 2015, he did very well for a while, was able to cut grass, trim his lawn, it did helped him for at least 2 weeks, then he had passing out   I reviewed laboratory evaluation in August 2017, normal B12, ferritin level, iron binding capacity was within normal limit, low hemoglobin 10 point 8, creatinine was elevated 1.53, glucose was 125  Previous laboratory evaluation in 2016 showed normal folic acid 12 point 5,low normal B12 level 266, hemoglobin in January 2015 was 14 point 3  He will be evaluated by hematologist/oncologist Dr. Mervin Kung in September 6th 2017, at [facility]  He complains of low back pain since he fell you August 1st 2017, has been taking intermittent Tylenol,  He complains of difficulty swallowing, both liquid, and solid food, no double vision, He is taking mestinon 60mg  4 times a day, which did help him.  Update January 29 2016: I have reviewed his hematologist Dr. Grier Mitts note on September 6th 2017, normocytic microchromic anemia, normal WBC, platelet count, likely from chronic kidney disease, history of fetal deficiency, is on B12 supplement, there was a consideration of low level hemolytic anemia from IVIG infusion, no overt evidence of blood loss  I reviewed laboratory evaluation in September 2017, parietal cell antibody IgG was negative, intrinsic  factor negative, mild elevated kappa free light chain, Hg 13.8, creat 1.38, GFR 47, ferritin 163, normal TSh 1.5, elevated vitamin B12 1480  He complains more fatigue, more swallowing difficulty, decreased po intake, he could not drinking water because worry about the choking episode, increased shortness of breath with exertion, intermittent blurry version, difficulty closing his eyes.  He is now taking mestinon 60mg  qid, no significant side effect, was not sure about the benefit.  Also reviewed swallowing evaluation on June 06 2015 at [facility], there was mild oropharyngeal dysphagia, characterized by spillover to the valleculae and the piriform sinus secondary to reduced oral motor coordination   patient was felt to safe to consume a regular diet with a full range of liquid with  recommendation of upright for food by mouth, and 45 minutes after meals, head flexed slightly forward , swallow at slower rate  UPDATE Oct 16th 2017: I reviewed laboratory in October 2017,  immune of fixative electrophoresis showed mild elevated Ig G2.8, otherwise was normal, mild anemia hemoglobin 11.7, mild elevated kappa.free light chain 26, mild elevated creatinine 1.38, normal ferritin 163, normal iron panel, TSH, folic acid, [id number],  Last ivig was in Sept, it has helped him 80%,  he can chew and swallow, he continue complains of blurry vision,  REVIEW OF SYSTEMS: Out of a complete 14 system review of symptoms, the patient complains only of the following symptoms, and all other reviewed systems,  are negative Numbness, weakness, anemia, anxiety ALLERGIES: Allergies  Allergen Reactions  . Aspirin   . Shellfish Allergy     HOME MEDICATIONS: Outpatient Medications Prior to Visit  Medication Sig Dispense Refill  . pyridostigmine (MESTINON) 60 MG tablet TAKE 1 TABLET FOUR TIMES DAILY 360 tablet 3  . ranitidine (ZANTAC) 75 MG tablet TAKE 1 TABLET TWICE DAILY 180 tablet 3  .  valsartan-hydrochlorothiazide (DIOVAN-HCT) 320-25 MG per tablet Take 1 tablet by mouth daily.    [name] warfarin (COUMADIN) 5 MG tablet 5 mg. Taking 5mg  Mon, Wed, Fri.  Taking 2.5mg  Sun, Tues, Thurs, Sat.     No facility-administered medications prior to visit.     PAST MEDICAL HISTORY: Past Medical History:  Diagnosis Date  . B12 deficiency   . Essential tremor   . Hypertension   . Memory deficit   . Myasthenia gravis ([place])    since 2004  . Pulmonary emboli ([place])    in Nov 2003  . Skin cancer     PAST SURGICAL HISTORY: Past Surgical History:  Procedure Laterality Date  . CARPAL TUNNEL RELEASE Right 09/14/2012   Procedure: CARPAL TUNNEL RELEASE;  Surgeon: Cammie Sickle., MD;  Location: [place];  Service: Orthopedics;  Laterality: Right;  . CARPAL TUNNEL RELEASE Left 11/04/2015   Procedure: LEFT CARPAL TUNNEL RELEASE, EXTENDED INCISION;  Surgeon: Daryll Brod, MD;  Location: [place];  Service: Orthopedics;  Laterality: Left;  ANESTHESIA: IV REGIONAL UPPER ARM  . CHOLECYSTECTOMY  2012   [place]  . COLONOSCOPY    . FOOT SURGERY  1966   lt orif foot  . SKIN CANCER EXCISION      FAMILY HISTORY: Family History  Problem Relation Age of Onset  . Heart disease Mother   . Heart disease Father     SOCIAL HISTORY: Social History   Social History  . Marital status: Married    Spouse name: [name]  . Number of children: 2  . Years of education: 12   Occupational History  . Plant     Retired   Social History Main Topics  . Smoking status: Former Smoker    Quit date: 05/17/1968  . Smokeless tobacco: Never Used  . Alcohol use No  . Drug use: No  . Sexual activity: Not on file   Other Topics Concern  . Not on file   Social History Narrative   He lives with his wife [name](, has 2 children, retired. Hard of hearing.   Right handed.   Caffeine- None      PHYSICAL EXAM  Vitals:   03/01/16 0857  BP: 137/71  Pulse: 63  Weight: 214 lb  (97.1 kg)  Height: 5\' 9"  (1.753 m)   Body mass index is 31.6 kg/m.   PHYSICAL EXAMNIATION:  Gen: NAD, conversant, well nourised, obese, well groomed                     Cardiovascular: Regular rate  rhythm, no peripheral edema, warm, nontender. Eyes: Conjunctivae clear without exudates or hemorrhage Neck: Supple, no carotid bruise. Pulmonary: Clear to auscultation bilaterally   NEUROLOGICAL EXAM:  MENTAL STATUS: Speech:    Speech is normal; fluent and spontaneous with normal comprehension.  Cognition:     Orientation to time, place and person     Normal recent and remote memory     Normal Attention span and concentration     Normal Language, naming, repeating,spontaneous speech     Fund of knowledge   CRANIAL NERVES: CN II: Visual fields are full to confrontation. Fundoscopic exam is normal with sharp discs and no vascular changes. Pupils are round equal and briskly reactive to light. CN III, IV, VI: extraocular movement are normal. Fatigable ptosis, cover and uncover testing showed bilateral exophoria CN V: Facial sensation is intact to pinprick in all 3 divisions bilaterally. Corneal responses are intact.  CN VII: He has Moderate eye closure, cheek puff weakness. CN VIII: Hearing is normal to rubbing fingers CN IX, X: Palate elevates symmetrically. Phonation is normal. CN XI: Head turning and shoulder shrug are intact CN XII: Tongue is midline with normal movements and no atrophy.  MOTOR:  He has mild neck flexion, there was mild bilateral shoulder abduction, external rotation, mild bilateral hip flexion weakness  REFLEXES: Reflexes are 2+ and symmetric at the biceps, triceps, knees, and ankles. Plantar responses are flexor.  SENSORY: Intact to light touch, pinprick, position sense, and vibration sense are intact in fingers and toes.  COORDINATION: Rapid alternating movements and fine finger movements are intact. There is no dysmetria on finger-to-nose and  heel-knee-shin.    GAIT/STANCE: He is able to get up from seated position arm crossed but with effort, But could not get up from low seated position, need to push on, mildly unsteady, difficulty to perform tandem, tiptoe tandem walking  DIAGNOSTIC DATA (LABS, IMAGING, TESTING) - I reviewed patient records, labs, notes, testing and imaging myself where available.   ASSESSMENT AND PLAN 82 y.o. year old male   Myasthenia gravis with exacerbation  with dysphagia, mild breathing difficulty, mild to moderate bulbar limb muscle weakness, worsening double vision, ptosis  He responded very well to previous IVIG treatment, most recent one was in December 05 2015,  Not a candidate for long term immunosuppressive treatment due to his recurrent large area skin cancer  Repeat IVIG 2 g/kg= 0.4 g/kgx5 5 days in September 2017,  then maintenance dose 1 g/kg every 3-4 weeks, repeat 5 times  Continue Mestinon 60 mg 4 times a day    Shortness of breath  Refer him to pulmonary function test, showed evidence of small airway construction,  Multifactorial, weakness due to myasthenia gravis exacerbation, history of PE, small airway construction,  Continue Coumadin treatment for pulmonary emboli  New onset anemia  Is under the care of hematologist Dr. Whitney Muse   most consistent with his chronic kidney disease, could not rule out small hemolytic anemia triggered by IVIG infusion  Will monitor blood count closely following ivig infusion       Marcial Pacas, M.D. Ph.D.  [facility] [address] [place], [place] [address] [phone]

## 2016-03-05 DIAGNOSIS — S22000D Wedge compression fracture of unspecified thoracic vertebra, subsequent encounter for fracture with routine healing: Secondary | ICD-10-CM | POA: Diagnosis not present

## 2016-03-08 DIAGNOSIS — G7001 Myasthenia gravis with (acute) exacerbation: Secondary | ICD-10-CM | POA: Diagnosis not present

## 2016-03-09 ENCOUNTER — Telehealth: Payer: Self-pay | Admitting: Neurology

## 2016-03-09 DIAGNOSIS — G7001 Myasthenia gravis with (acute) exacerbation: Secondary | ICD-10-CM | POA: Diagnosis not present

## 2016-03-09 DIAGNOSIS — I82401 Acute embolism and thrombosis of unspecified deep veins of right lower extremity: Secondary | ICD-10-CM | POA: Diagnosis not present

## 2016-03-09 DIAGNOSIS — M1712 Unilateral primary osteoarthritis, left knee: Secondary | ICD-10-CM | POA: Diagnosis not present

## 2016-03-09 DIAGNOSIS — M25562 Pain in left knee: Secondary | ICD-10-CM | POA: Diagnosis not present

## 2016-03-09 DIAGNOSIS — I2699 Other pulmonary embolism without acute cor pulmonale: Secondary | ICD-10-CM | POA: Diagnosis not present

## 2016-03-09 NOTE — Telephone Encounter (Signed)
Patient's wife is wondering if patient should have PT tomorrow since he had an infusion today. Best call back is [phone].

## 2016-03-09 NOTE — Telephone Encounter (Signed)
Ok, per Dr. [name], to go to PT if he is up for it.  Spoke to Velma and she is aware.

## 2016-03-10 DIAGNOSIS — S22000D Wedge compression fracture of unspecified thoracic vertebra, subsequent encounter for fracture with routine healing: Secondary | ICD-10-CM | POA: Diagnosis not present

## 2016-03-12 DIAGNOSIS — S22000D Wedge compression fracture of unspecified thoracic vertebra, subsequent encounter for fracture with routine healing: Secondary | ICD-10-CM | POA: Diagnosis not present

## 2016-03-17 DIAGNOSIS — S22000D Wedge compression fracture of unspecified thoracic vertebra, subsequent encounter for fracture with routine healing: Secondary | ICD-10-CM | POA: Diagnosis not present

## 2016-03-19 DIAGNOSIS — S22000D Wedge compression fracture of unspecified thoracic vertebra, subsequent encounter for fracture with routine healing: Secondary | ICD-10-CM | POA: Diagnosis not present

## 2016-03-23 DIAGNOSIS — I2699 Other pulmonary embolism without acute cor pulmonale: Secondary | ICD-10-CM | POA: Diagnosis not present

## 2016-03-23 DIAGNOSIS — I82401 Acute embolism and thrombosis of unspecified deep veins of right lower extremity: Secondary | ICD-10-CM | POA: Diagnosis not present

## 2016-03-24 DIAGNOSIS — S22000D Wedge compression fracture of unspecified thoracic vertebra, subsequent encounter for fracture with routine healing: Secondary | ICD-10-CM | POA: Diagnosis not present

## 2016-03-26 DIAGNOSIS — S22000D Wedge compression fracture of unspecified thoracic vertebra, subsequent encounter for fracture with routine healing: Secondary | ICD-10-CM | POA: Diagnosis not present

## 2016-03-31 DIAGNOSIS — S22000D Wedge compression fracture of unspecified thoracic vertebra, subsequent encounter for fracture with routine healing: Secondary | ICD-10-CM | POA: Diagnosis not present

## 2016-04-02 DIAGNOSIS — S22000D Wedge compression fracture of unspecified thoracic vertebra, subsequent encounter for fracture with routine healing: Secondary | ICD-10-CM | POA: Diagnosis not present

## 2016-04-02 DIAGNOSIS — I82401 Acute embolism and thrombosis of unspecified deep veins of right lower extremity: Secondary | ICD-10-CM | POA: Diagnosis not present

## 2016-04-12 DIAGNOSIS — G7001 Myasthenia gravis with (acute) exacerbation: Secondary | ICD-10-CM | POA: Diagnosis not present

## 2016-04-13 ENCOUNTER — Ambulatory Visit: Payer: Medicare Other | Admitting: Neurology

## 2016-04-13 DIAGNOSIS — G7001 Myasthenia gravis with (acute) exacerbation: Secondary | ICD-10-CM | POA: Diagnosis not present

## 2016-04-14 DIAGNOSIS — S22000D Wedge compression fracture of unspecified thoracic vertebra, subsequent encounter for fracture with routine healing: Secondary | ICD-10-CM | POA: Diagnosis not present

## 2016-04-16 DIAGNOSIS — S22000D Wedge compression fracture of unspecified thoracic vertebra, subsequent encounter for fracture with routine healing: Secondary | ICD-10-CM | POA: Diagnosis not present

## 2016-04-19 DIAGNOSIS — I82401 Acute embolism and thrombosis of unspecified deep veins of right lower extremity: Secondary | ICD-10-CM | POA: Diagnosis not present

## 2016-04-19 DIAGNOSIS — I2699 Other pulmonary embolism without acute cor pulmonale: Secondary | ICD-10-CM | POA: Diagnosis not present

## 2016-04-21 DIAGNOSIS — S22000D Wedge compression fracture of unspecified thoracic vertebra, subsequent encounter for fracture with routine healing: Secondary | ICD-10-CM | POA: Diagnosis not present

## 2016-04-23 DIAGNOSIS — S22000D Wedge compression fracture of unspecified thoracic vertebra, subsequent encounter for fracture with routine healing: Secondary | ICD-10-CM | POA: Diagnosis not present

## 2016-04-28 DIAGNOSIS — S22000D Wedge compression fracture of unspecified thoracic vertebra, subsequent encounter for fracture with routine healing: Secondary | ICD-10-CM | POA: Diagnosis not present

## 2016-04-29 DIAGNOSIS — S39012A Strain of muscle, fascia and tendon of lower back, initial encounter: Secondary | ICD-10-CM | POA: Diagnosis not present

## 2016-04-30 DIAGNOSIS — S22000D Wedge compression fracture of unspecified thoracic vertebra, subsequent encounter for fracture with routine healing: Secondary | ICD-10-CM | POA: Diagnosis not present

## 2016-05-05 ENCOUNTER — Ambulatory Visit (INDEPENDENT_AMBULATORY_CARE_PROVIDER_SITE_OTHER): Payer: Medicare Other | Admitting: Neurology

## 2016-05-05 ENCOUNTER — Encounter: Payer: Self-pay | Admitting: Neurology

## 2016-05-05 VITALS — BP 134/48 | HR 65 | Ht 69.0 in | Wt 213.6 lb

## 2016-05-05 DIAGNOSIS — I2782 Chronic pulmonary embolism: Secondary | ICD-10-CM

## 2016-05-05 DIAGNOSIS — S22000D Wedge compression fracture of unspecified thoracic vertebra, subsequent encounter for fracture with routine healing: Secondary | ICD-10-CM | POA: Diagnosis not present

## 2016-05-05 DIAGNOSIS — G7000 Myasthenia gravis without (acute) exacerbation: Secondary | ICD-10-CM | POA: Diagnosis not present

## 2016-05-05 NOTE — Progress Notes (Signed)
Chief Complaint  Patient presents with  . Myasthenia Gravis    He is here with wife, [name] and his granddaughter, Jarrett Soho.  His last IVIG was 80/27/17 and 04/13/17.  His symptoms are unchanged.  He still has shortness of breath and fatigue.     Chief Complaint  Patient presents with  . Myasthenia Gravis    He is here with wife, [name] and his granddaughter, Jarrett Soho.  His last IVIG was 80/27/17 and 04/13/17.  His symptoms are unchanged.  He still has shortness of breath and fatigue.      PATIENT: Calvin Sloan DOB: July 19, 1933  REASON FOR VISIT: follow up- myasthenia gravis HISTORY FROM: patient  HISTORY OF PRESENT ILLNESS HISTORY 02/18/15 [name]: Calvin Sloan is a 82 -year-old right-handed white married male with generalized seropositive myasthenia gravis characterized by bulbar weakness, shortness of breath , and dysphagia. Patient of [name]  He was diagnosed in 10/2002 with a positive Tensilon test, positive acetylchoine receptor antibody,and had a positive ANA at 1-160 speckled pattern. Initially he had right eye ptosis and subsequently, double vision, fatigue with chewing, dysphagia, and upper and lower extremity weakness. He was initially treated with Mestinon bromide in June 2004, which was associated with GI side effects.  Prednisone was started in 03/29/2003 and CellCept 05/2003. He developed increased jitteriness, rash, and cramps on CellCept which was discontinued 10/2003. He had progressive bulbar weakness and was admitted 12/2003 for IV IgG therapy which required a PEG tube for dysphagia transiently. He was started on Imuran. 01/17/2004 he was readmitted for 2 days of IV IgG because of shortness of breath. He has not been hospitalized since 01/2004.   He has been slowly tapered off prednisone 09/2007 and was on Imuran and Mestinon bromide.   He had B12 deficiency and was receiving B12 shots ,but B12 levels off injections have been normal and B12 shots were discontinued.  He  has a history of vague visual loss on the left of unknown etiology. He denies swallowing problems, speech problems, voice changes, focal weakness, or double vision.  He is independent in activities of daily living and takes one nap per day.He denies memory loss but his wife states that he does have memory loss. His last blood studies in my office 05/27/10 were normal except a low white blood cell count of 3900.  His dermatologist [name] was concerned that the Imuran is associated with increased risk of skin cancer and the imuran was discontinued in 12/30/2010. His skin cancer (basal cell cancer) has improved since his imuran was stopped.  He has noted no change In myasthenic symptoms. His skin has improved. His medication for myasthenia is mesinon bromide 60 milligrams one and one half tablets 4 times a day. He has shortness of breath since his pulmonary emboli in 2003, also complains of excessive fatigue.   He is allergic to aspirin, GI side effect, gastric ulcer, and is on Plavix for a history of TIAs with right arm weakness He has swallowing problems at the beginning of the meal with liquids that improves as the meal continues.   He had right CTS release in May 2014 by [name].  He complains of shortness of breath with walking, bending over to tire his shoed, due to PE 2003, pulmnolgist folllowed up, he does not like to chew on tough beef, he denies swallowing difficulty, no double vision, no droopy eyelid, no significant bleeding muscle weakness. He is active in yard, shop, threadmill   UPDATE Jan  16th 2015: He is now tapering off Mestinon, there was no significant change in his functional status, he denies double vision, no ptosis, continue complains of shortness of breath with exertion no significant and muscle weakness, he has to wash down his food sometimes, but no significant swelling, or chewing difficulty, no dysarthria  UPDATE July 16th 2015: He is no longer mestinon,  he does complains of increased breathing difficulty from prolonged walking, no chewing difficulty, no double vision, no ptosis.  Laboratory continue to demonstrate positive acetylcholine binding, and modulating antibodies. He did reported that his skin cancer has improved after stopping imuran, could not tolerate cellcept per record. Previously he responded to IVIG very well.  UPDATE Sept 25th 2015: He was stated on prednisone 10mg  qday, and mestinon 60mg  tid in June 2015, which did help his fatigue, shortness of breath, wife reported 50% improvement, he has increased appetite  But he was admitted to [facility] hospital in Sept 17th for bilateral PEs, right leg DVT, he is started on coumadin, bridge with Lovenox, [name] is monitoring his INR  He denies double vision, no ptosis, no limb muscle weakness, no dysphagia  UPDATE April 4th 2016: He is on coumadin for PE, INR was monitored by [name], his myasthenia gravis is under good control, is taking prednisone 10 mg every day, he has surgery in Nov 2015 for right leg cellulitis.  He only does little around his house, he has no double vision, SOB due to previous PE,   UPDATE Feb 18 2015: Previous laboratory evaluation showed mild B12 deficiency with level of 204, He did receive IM B12 supplement at his primary care Dr. [name] office for a while, now with normal repeat laboratory evaluation, injection has stopped He has mild choking difficulty, this has been going on since August 2016, not getting worse, he has more trouble with liquid. He complains of shortness of breath when bending over. He denies significant gait difficulty, no breathing difficulty otherwise, he denies double vision, no ptosis,, No chewing difficulties   UPDATE May 22 2015: He came in last visit April 16 2015 complains of worsening blurry vision, double vision, swallowing difficulties, trouble talking, he reported 50% weakness compared to baseline, difficulty  getting up from seated position, difficulty chewing, worsening shortness of breath, on examination, he was noted to have increased weakness compared to previous examination, he had moderate eye-closure cheek puff, mild neck flexion weakness, mild bilateral shoulder abduction bilateral hip flexion weakness.  He was treated with IVIG 400 mg/kg every day for 5 days from December 11 to May 02 2015, his vision has much improved, he can walk better, but he continue have swallowing difficulty, dysarthria, he is also taking prednisone 40 mg every day, increase his Mestinon 60 mg to 3-4 tablets daily  He was not able to tolerate immunosuppression treatment in the past due to skin cancer  UPDATE Jul 07 2015:  I have called his wife, pulmonary function test in June 05 2015.  1, FVC is 3.1 5, 87%, this is normal 2. FEV1 is 2.1 6, 77%, with a percent FEV1 of 68, this indicated mild to moderate primary small  obstruction in this patient was a history of cigarette smoking, large elderly flow rates are generally normal  3. Flow volume loop is unremarkable 4, lung volumes indicate some evidence of air trapping with an increased residual volume, the TLC is normal, the increased residual volume confirms the presence of airflow obstruction  5, DLCO is 19.0, 90%, this  is normal 6, elderly resistant 3.7 2, 265%, this is increased  Wife reported, he recently had swallowing study, there was no significant abnormality found, his swallowing, breathing, symptoms overall has improved  He did think that IVIG treatment in Dec 11-16th 2016 has helped his blurry vision, it is gone, he still feel straggled sometimes, he has shortness with exertion and bending over. His talking is much better.  UPDATE May 22nd 2017: He continue complains of shortness of breath with minimum exertion, no diplopia, no double vision, mild dysphasia,  He had a history of right carpal tunnel release surgery in the past, which has been  helpful, now complains of left first 3 finger paresthesia, EMG nerve conduction studies was planned at local hospital  UPDATE July 6th 2017: He stopped taking Mestinon around May 20 third 2017, shortly afterwards, he noticed double vision, he clearly describe eighth of binocular double vision, sometimes vertigo, sometimes horizontal, he went on higher dose of Mestinon 60 mg 4 times a day without improving his symptoms, he slept on a bed with 30 head raise, with no significant breathing difficulty, he could not eat his dinner on July 5th 2017 due to chewing swallowing difficulty, he could not fly flat sleeping, mildly unsteady gait, increased fatigue,  UPDATE August 17th 2017: Last IVIG treatment was December 05 2015, he did very well for a while, was able to cut grass, trim his lawn, it did helped him for at least 2 weeks, then he had passing out   I reviewed laboratory evaluation in August 2017, normal B12, ferritin level, iron binding capacity was within normal limit, low hemoglobin 10 point 8, creatinine was elevated 1.53, glucose was 125  Previous laboratory evaluation in 2016 showed normal folic acid 12 point 5,low normal B12 level 266, hemoglobin in January 2015 was 14 point 3  He will be evaluated by hematologist/oncologist Dr. Mervin Kung in September 6th 2017, at [facility]  He complains of low back pain since he fell you August 1st 2017, has been taking intermittent Tylenol,  He complains of difficulty swallowing, both liquid, and solid food, no double vision, He is taking mestinon 60mg  4 times a day, which did help him.  Update January 29 2016: I have reviewed his hematologist Dr. Grier Mitts note on September 6th 2017, normocytic microchromic anemia, normal WBC, platelet count, likely from chronic kidney disease, history of fetal deficiency, is on B12 supplement, there was a consideration of low level hemolytic anemia from IVIG infusion, no overt evidence of blood loss  I reviewed  laboratory evaluation in September 2017, parietal cell antibody IgG was negative, intrinsic factor negative, mild elevated kappa free light chain, Hg 13.8, creat 1.38, GFR 47, ferritin 163, normal TSh 1.5, elevated vitamin B12 1480  He complains more fatigue, more swallowing difficulty, decreased po intake, he could not drinking water because worry about the choking episode, increased shortness of breath with exertion, intermittent blurry version, difficulty closing his eyes.  He is now taking mestinon 60mg  qid, no significant side effect, was not sure about the benefit.  Also reviewed swallowing evaluation on June 06 2015 at [facility], there was mild oropharyngeal dysphagia, characterized by spillover to the valleculae and the piriform sinus secondary to reduced oral motor coordination   patient was felt to safe to consume a regular diet with a full range of liquid with  recommendation of upright for food by mouth, and 45 minutes after meals, head flexed slightly forward , swallow at slower rate  UPDATE Oct 16th 2017: I reviewed laboratory in October 2017, immune of fixative electrophoresis showed mild elevated Ig G2.8, otherwise was normal, mild anemia hemoglobin 11.7, mild elevated kappa.free light chain 26, mild elevated creatinine 1.38, normal ferritin 163, normal iron panel, TSH, folic acid, [id number],  Last ivig was in Sept, it has helped him 80%,  he can chew and swallow, he continue complains of blurry vision,  UPDATE May 05 2016: He is overall much improved, but still complains of SOB, is at his baseline, last infusion was on Nov 27, 28th, he will have more infusion in December, he has mild dysarthria, slight dysphagia will see hematologist Dr. Whitney Muse in January 2018,   REVIEW OF SYSTEMS: Out of a complete 14 system review of symptoms, the patient complains only of the following symptoms, and all other reviewed systems,  are negative Hearing loss, shortness of breath,  back pain, itching   ALLERGIES: Allergies  Allergen Reactions  . Aspirin   . Shellfish Allergy     HOME MEDICATIONS: Outpatient Medications Prior to Visit  Medication Sig Dispense Refill  . pyridostigmine (MESTINON) 60 MG tablet TAKE 1 TABLET FOUR TIMES DAILY 360 tablet 3  . ranitidine (ZANTAC) 75 MG tablet TAKE 1 TABLET TWICE DAILY 180 tablet 3  . valsartan-hydrochlorothiazide (DIOVAN-HCT) 320-25 MG per tablet Take 1 tablet by mouth daily.    [name] warfarin (COUMADIN) 5 MG tablet 5 mg. Taking 5mg  Mon, Wed, Fri.  Taking 2.5mg  Sun, Tues, Thurs, Sat.     No facility-administered medications prior to visit.     PAST MEDICAL HISTORY: Past Medical History:  Diagnosis Date  . B12 deficiency   . Essential tremor   . Hypertension   . Memory deficit   . Myasthenia gravis ([place])    since 2004  . Pulmonary emboli ([place])    in Nov 2003  . Skin cancer     PAST SURGICAL HISTORY: Past Surgical History:  Procedure Laterality Date  . CARPAL TUNNEL RELEASE Right 09/14/2012   Procedure: CARPAL TUNNEL RELEASE;  Surgeon: Cammie Sickle., MD;  Location: [facility];  Service: Orthopedics;  Laterality: Right;  . CARPAL TUNNEL RELEASE Left 11/04/2015   Procedure: LEFT CARPAL TUNNEL RELEASE, EXTENDED INCISION;  Surgeon: Daryll Brod, MD;  Location: [facility];  Service: Orthopedics;  Laterality: Left;  ANESTHESIA: IV REGIONAL UPPER ARM  . CHOLECYSTECTOMY  2012   [facility]  . COLONOSCOPY    . FOOT SURGERY  1966   lt orif foot  . SKIN CANCER EXCISION      FAMILY HISTORY: Family History  Problem Relation Age of Onset  . Heart disease Mother   . Heart disease Father     SOCIAL HISTORY: Social History   Social History  . Marital status: Married    Spouse name: [name]  . Number of children: 2  . Years of education: 12   Occupational History  . Plant     Retired   Social History Main Topics  . Smoking status: Former Smoker    Quit date: 05/17/1968  .  Smokeless tobacco: Never Used  . Alcohol use No  . Drug use: No  . Sexual activity: Not on file   Other Topics Concern  . Not on file   Social History Narrative   He lives with his wife [name](, has 2 children, retired. Hard of hearing.   Right handed.   Caffeine- None      PHYSICAL EXAM  Vitals:  05/05/16 1304  BP: (!) 134/48  Pulse: 65  Weight: 213 lb 9.4 oz (96.9 kg)  Height: 5\' 9"  (1.753 m)   Body mass index is 31.54 kg/m.   PHYSICAL EXAMNIATION:  Gen: NAD, conversant, well nourised, obese, well groomed                     Cardiovascular: Regular rate rhythm, no peripheral edema, warm, nontender. Eyes: Conjunctivae clear without exudates or hemorrhage Neck: Supple, no carotid bruise. Pulmonary: Clear to auscultation bilaterally   NEUROLOGICAL EXAM:  MENTAL STATUS: Speech:    Speech is normal; fluent and spontaneous with normal comprehension.  Cognition:     Orientation to time, place and person     Normal recent and remote memory     Normal Attention span and concentration     Normal Language, naming, repeating,spontaneous speech     Fund of knowledge   CRANIAL NERVES: CN II: Visual fields are full to confrontation. Fundoscopic exam is normal with sharp discs and no vascular changes. Pupils are round equal and briskly reactive to light. CN III, IV, VI: extraocular movement are normal. Fatigable ptosis, cover and uncover testing showed bilateral exophoria CN V: Facial sensation is intact to pinprick in all 3 divisions bilaterally. Corneal responses are intact.  CN VII: He has mild eye closure, cheek puff weakness. CN VIII: Hearing is normal to rubbing fingers CN IX, X: Palate elevates symmetrically. Phonation is normal. CN XI: Head turning and shoulder shrug are intact CN XII: Tongue is midline with normal movements and no atrophy.  MOTOR:  He has no neck flexion weakness, no significant limb muscle weakness.  REFLEXES: Reflexes are 2+ and symmetric  at the biceps, triceps, knees, and ankles. Plantar responses are flexor.  SENSORY: Intact to light touch, pinprick, position sense, and vibration sense are intact in fingers and toes.  COORDINATION: Rapid alternating movements and fine finger movements are intact. There is no dysmetria on finger-to-nose and heel-knee-shin.    GAIT/STANCE: He is able to get up from seated position arm crossed but with effort,  Mildly antalgic gait.  DIAGNOSTIC DATA (LABS, IMAGING, TESTING) - I reviewed patient records, labs, notes, testing and imaging myself where available.   ASSESSMENT AND PLAN 82 y.o. year old male   Seropositive Myasthenia gravis with exacerbation since May 2017.  with dysphagia, mild breathing difficulty, mild to moderate bulbar limb muscle weakness, worsening double vision, ptosis  He responded very well to previous IVIG treatment, most recent one was in December 05 2015,  Not a candidate for long term immunosuppressive treatment due to his recurrent large area skin cancer  Repeat IVIG 2 g/kg= 0.4 g/kgx5 5 days in September 2017,  then maintenance dose 1 g/kg every 3-4 weeks, repeat 5 times  Continue Mestinon 60 mg 4 times a day    Shortness of breath  Refer him to pulmonary function test, showed evidence of small airway construction,  Multifactorial, weakness due to myasthenia gravis exacerbation, history of PE, small airway construction,  Continue Coumadin treatment for pulmonary emboli  New onset anemia  Is under the care of hematologist Dr. Whitney Muse   most consistent with his chronic kidney disease, could not rule out small hemolytic anemia triggered by IVIG infusion  Will monitor blood count closely following ivig infusion       Marcial Pacas, M.D. Ph.D.  [facility] [address], [place],  [id number] [phone]

## 2016-05-07 DIAGNOSIS — S22000D Wedge compression fracture of unspecified thoracic vertebra, subsequent encounter for fracture with routine healing: Secondary | ICD-10-CM | POA: Diagnosis not present

## 2016-05-12 ENCOUNTER — Telehealth: Payer: Self-pay | Admitting: Neurology

## 2016-05-12 DIAGNOSIS — G7001 Myasthenia gravis with (acute) exacerbation: Secondary | ICD-10-CM | POA: Diagnosis not present

## 2016-05-12 NOTE — Telephone Encounter (Signed)
I saw him at infusion suit today, he tolerated previous ivig infusion well, is now at baseline  Continue IVIG infusion Dec 27, 28th, will hold off further infusion for Jan and Feb, follow up in April, call if he developed weakness inbetween

## 2016-05-13 DIAGNOSIS — G7001 Myasthenia gravis with (acute) exacerbation: Secondary | ICD-10-CM | POA: Diagnosis not present

## 2016-05-14 DIAGNOSIS — S22000D Wedge compression fracture of unspecified thoracic vertebra, subsequent encounter for fracture with routine healing: Secondary | ICD-10-CM | POA: Diagnosis not present

## 2016-05-27 ENCOUNTER — Encounter (HOSPITAL_COMMUNITY): Payer: Self-pay | Admitting: Hematology & Oncology

## 2016-05-27 ENCOUNTER — Encounter (HOSPITAL_COMMUNITY): Payer: Medicare Other | Attending: Hematology & Oncology | Admitting: Hematology & Oncology

## 2016-05-27 ENCOUNTER — Encounter (HOSPITAL_COMMUNITY): Payer: Medicare Other

## 2016-05-27 VITALS — BP 142/70 | HR 52 | Temp 97.9°F | Resp 18 | Wt 217.1 lb

## 2016-05-27 DIAGNOSIS — N183 Chronic kidney disease, stage 3 unspecified: Secondary | ICD-10-CM

## 2016-05-27 DIAGNOSIS — G7000 Myasthenia gravis without (acute) exacerbation: Secondary | ICD-10-CM | POA: Diagnosis not present

## 2016-05-27 DIAGNOSIS — Z85828 Personal history of other malignant neoplasm of skin: Secondary | ICD-10-CM | POA: Diagnosis not present

## 2016-05-27 DIAGNOSIS — Z86711 Personal history of pulmonary embolism: Secondary | ICD-10-CM | POA: Insufficient documentation

## 2016-05-27 DIAGNOSIS — D649 Anemia, unspecified: Secondary | ICD-10-CM | POA: Diagnosis not present

## 2016-05-27 DIAGNOSIS — Z7901 Long term (current) use of anticoagulants: Secondary | ICD-10-CM

## 2016-05-27 DIAGNOSIS — Z87891 Personal history of nicotine dependence: Secondary | ICD-10-CM | POA: Diagnosis not present

## 2016-05-27 DIAGNOSIS — G250 Essential tremor: Secondary | ICD-10-CM | POA: Diagnosis not present

## 2016-05-27 DIAGNOSIS — I120 Hypertensive chronic kidney disease with stage 5 chronic kidney disease or end stage renal disease: Secondary | ICD-10-CM | POA: Diagnosis not present

## 2016-05-27 DIAGNOSIS — D518 Other vitamin B12 deficiency anemias: Secondary | ICD-10-CM | POA: Diagnosis not present

## 2016-05-27 DIAGNOSIS — E538 Deficiency of other specified B group vitamins: Secondary | ICD-10-CM | POA: Diagnosis not present

## 2016-05-27 DIAGNOSIS — Z8719 Personal history of other diseases of the digestive system: Secondary | ICD-10-CM | POA: Insufficient documentation

## 2016-05-27 LAB — CBC WITH DIFFERENTIAL/PLATELET
BASOS PCT: 1 %
Basophils Absolute: 0 10*3/uL (ref 0.0–0.1)
Eosinophils Absolute: 0 10*3/uL (ref 0.0–0.7)
Eosinophils Relative: 0 %
HEMATOCRIT: 39.1 % (ref 39.0–52.0)
Hemoglobin: 13.5 g/dL (ref 13.0–17.0)
Lymphocytes Relative: 24 %
Lymphs Abs: 1.2 10*3/uL (ref 0.7–4.0)
MCH: 31.5 pg (ref 26.0–34.0)
MCHC: 34.5 g/dL (ref 30.0–36.0)
MCV: 91.1 fL (ref 78.0–100.0)
MONO ABS: 0.5 10*3/uL (ref 0.1–1.0)
MONOS PCT: 10 %
NEUTROS ABS: 3.1 10*3/uL (ref 1.7–7.7)
Neutrophils Relative %: 65 %
PLATELETS: 191 10*3/uL (ref 150–400)
RBC: 4.29 MIL/uL (ref 4.22–5.81)
RDW: 14.1 % (ref 11.5–15.5)
WBC: 4.8 10*3/uL (ref 4.0–10.5)

## 2016-05-27 LAB — COMPREHENSIVE METABOLIC PANEL
ALBUMIN: 3.8 g/dL (ref 3.5–5.0)
ALT: 26 U/L (ref 17–63)
ANION GAP: 5 (ref 5–15)
AST: 30 U/L (ref 15–41)
Alkaline Phosphatase: 51 U/L (ref 38–126)
BILIRUBIN TOTAL: 1 mg/dL (ref 0.3–1.2)
BUN: 19 mg/dL (ref 6–20)
CO2: 29 mmol/L (ref 22–32)
Calcium: 9.1 mg/dL (ref 8.9–10.3)
Chloride: 102 mmol/L (ref 101–111)
Creatinine, Ser: 1.54 mg/dL — ABNORMAL HIGH (ref 0.61–1.24)
GFR calc Af Amer: 47 mL/min — ABNORMAL LOW (ref >60)
GFR, EST NON AFRICAN AMERICAN: 41 mL/min — AB (ref >60)
GLUCOSE: 97 mg/dL (ref 65–99)
POTASSIUM: 4.3 mmol/L (ref 3.5–5.1)
Sodium: 136 mmol/L (ref 135–145)
TOTAL PROTEIN: 7.8 g/dL (ref 6.5–8.1)

## 2016-05-27 LAB — LACTATE DEHYDROGENASE: LDH: 151 U/L (ref 98–192)

## 2016-05-27 NOTE — Progress Notes (Signed)
HEMATOLOGY/ONCOLOGY Progress Note  Date of Service: 05/27/2016  Patient Care Team: Calvin Percy, MD as PCP - General (Family Medicine)  CHIEF COMPLAINTS/PURPOSE OF CONSULTATION:  Anemia  HISTORY OF PRESENTING ILLNESS:   Calvin Sloan is a wonderful 83 y.o. male who has been referred to [place] by Dr .Calvin Percy, MD for evaluation and management of Anemia.  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  Calvin Sloan is accompanied by his wife.   I personally reviewed and went over laboratory studies with the patient.  Wife states he is still getting his IVIG for his myasthenia. He is doing better.   Reports he had a good holiday.Appetite is good.   Wife states he has a rash on his neck and she didn't know if it was from the B12 or not. States his dermatologist is located in [place].  He notes no other complaints. Fatigue is no worse. No blood in his stool or urine.   MEDICAL HISTORY:  Past Medical History:  Diagnosis Date  . B12 deficiency   . Essential tremor   . Hypertension   . Memory deficit   . Myasthenia gravis ([place])    since 2004  . Pulmonary emboli ([place])    in Nov 2003  . 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   SURGICAL HISTORY: Past Surgical History:  Procedure Laterality Date  . CARPAL TUNNEL RELEASE Right 09/14/2012   Procedure: CARPAL TUNNEL RELEASE;  Surgeon: Cammie Sickle., MD;  Location: [facility];  Service: Orthopedics;  Laterality: Right;  . CARPAL TUNNEL RELEASE Left 11/04/2015   Procedure: LEFT CARPAL TUNNEL RELEASE, EXTENDED  INCISION;  Surgeon: Daryll Brod, MD;  Location: [facility];  Service: Orthopedics;  Laterality: Left;  ANESTHESIA: IV REGIONAL UPPER ARM  . CHOLECYSTECTOMY  2012   [facility]  . COLONOSCOPY    . FOOT SURGERY  1966   lt orif foot  . SKIN CANCER EXCISION      SOCIAL HISTORY: Social History   Social History  . Marital status: Married    Spouse name: Calvin Sloan  . Number of children: 2  . Years of education: 12   Occupational History  . Plant     Retired   Social History Main Topics  . Smoking status: Former Smoker    Quit date: 05/17/1968  . Smokeless tobacco: Never Used  . Alcohol use No  . Drug use: No  . Sexual activity: Not on file   Other Topics Concern  . Not on file   Social History Narrative   He lives with his wife Calvin Sloan(, has 2 children, retired. Hard of hearing.   Right handed.   Caffeine- None    FAMILY HISTORY: Family History  Problem Relation Age of Onset  . Heart disease Mother   . Heart disease Father     ALLERGIES:  is allergic to aspirin and shellfish allergy.  MEDICATIONS:  Current Outpatient Prescriptions  Medication Sig Dispense Refill  . pyridostigmine (MESTINON) 60 MG tablet TAKE 1 TABLET FOUR TIMES DAILY 360 tablet 3  . ranitidine (ZANTAC) 75 MG tablet TAKE 1  TABLET TWICE DAILY 180 tablet 3  . valsartan-hydrochlorothiazide (DIOVAN-HCT) 320-25 MG per tablet Take 1 tablet by mouth daily.    [name] warfarin (COUMADIN) 5 MG tablet 5 mg. Taking 5mg  Mon, Wed, Fri.  Taking 2.5mg  Sun, Tues, Thurs, Sat.     No current facility-administered medications for this visit.     REVIEW OF SYSTEMS:   Review of Systems  Constitutional: Negative.   HENT: Negative.   Eyes: Negative.   Respiratory: Negative.   Cardiovascular: Negative.   Genitourinary: Negative.   Skin: Positive for rash (on neck).  Neurological: Negative.   Endo/Heme/Allergies: Negative.   Psychiatric/Behavioral: Negative.   All other systems reviewed and are negative. 14 point  review of systems was performed and is negative except as detailed under history of present illness and above   PHYSICAL EXAMINATION: ECOG PERFORMANCE STATUS: 2 - Symptomatic, <50% confined to bed    Vitals:   05/27/16 1106  BP: (!) 142/70  Pulse: (!) 52  Resp: 18  Temp: 97.9 F (36.6 C)   Filed Weights   05/27/16 1106  Weight: 217 lb 1.6 oz (98.5 kg)   .Body mass index is 32.06 kg/m.   Physical Exam  Constitutional: He is oriented to person, place, and time.  Able to sit back up on exam table with assistance. Needed assistance to get down from exam table.   HENT:  Head: Normocephalic and atraumatic.  Mouth/Throat: Oropharynx is clear and moist.  Eyes: Conjunctivae and EOM are normal. Pupils are equal, round, and reactive to light.  Neck: Normal range of motion. Neck supple.  Cardiovascular: Normal rate, regular rhythm and normal heart sounds.   Pulmonary/Chest: Effort normal and breath sounds normal.  Abdominal: Soft. Bowel sounds are normal.  Musculoskeletal: Normal range of motion.  Neurological: He is alert and oriented to person, place, and time.  Skin: Skin is warm and dry. Rash (on neck) noted.  Nursing note and vitals reviewed.   LABORATORY DATA:  I have reviewed the data as listed  . CBC Latest Ref Rng & Units 05/27/2016 02/20/2016 01/21/2016  WBC 4.0 - 10.5 K/uL 4.8 5.0 5.5  Hemoglobin 13.0 - 17.0 g/dL 13.5 11.7(L) 13.8  Hematocrit 39.0 - 52.0 % 39.1 33.1(L) 41.5  Platelets 150 - 400 K/uL 191 157 193    . CMP Latest Ref Rng & Units 05/27/2016 01/21/2016 10/30/2015  Glucose 65 - 99 mg/dL 97 94 181(H)  BUN 6 - 20 mg/dL 19 13 14   Creatinine 0.61 - 1.24 mg/dL 1.54(H) 1.38(H) 1.50(H)  Sodium 135 - 145 mmol/L 136 139 135  Potassium 3.5 - 5.1 mmol/L 4.3 4.4 3.2(L)  Chloride 101 - 111 mmol/L 102 102 103  CO2 22 - 32 mmol/L 29 30 25   Calcium 8.9 - 10.3 mg/dL 9.1 9.7 9.0  Total Protein 6.5 - 8.1 g/dL 7.8 7.8 -  Total Bilirubin 0.3 - 1.2 mg/dL 1.0 0.8 -  Alkaline  Phos 38 - 126 U/L 51 71 -  AST 15 - 41 U/L 30 27 -  ALT 17 - 63 U/L 26 21 -    Results for Calvin, Sloan (MRN [id number])   Ref. Range 01/21/2016 11:09 01/21/2016 11:10  LDH Latest Ref Range: 98 - 192 U/L  148  Iron Latest Ref Range: 45 - 182 ug/dL 94   UIBC Latest Units: ug/dL 213   TIBC Latest Ref Range: 250 - 450 ug/dL 307   Saturation Ratios Latest Ref Range: 17.9 - 39.5 % 31   Ferritin Latest Ref  Range: 24 - 336 ng/mL 163   Intrinsic Factor Latest Ref Range: 0.0 - 1.1 AU/mL  1.1  Folate, Hemolysate Latest Ref Range: Not Estab. ng/mL >620.0   Folate, RBC Latest Ref Range: >498 ng/mL >1,516   Vitamin B12 Latest Ref Range: 180 - 914 pg/mL 1,480 (H)     RADIOGRAPHIC STUDIES: I have personally reviewed the radiological images as listed and agreed with the findings in the report. No results found.  ASSESSMENT & PLAN:  Anemia, multifactorial Myasthenia gravis B12 deficiency, on oral B12 CKD, stage 3 Chronic Anticoagulation History of recurrent VTE  83 year old Caucasian male with  #1 normocytic normochromic anemia. Normal WBC and platelet counts.Note that H/H are WNL today. Patient will 10.8 and MCV of 94 based on outside outside labs from 12/18/2015. This could likely be from his chronic kidney disease. He has had a history of B12 deficiency and is on B12 oral replacement at this time. Has had a history of myasthenia gravis since 2004. Was in remission for a long time and had relapse over the last 1-2 years. Currently was restarted on IVIG which can cause some low-level hemolytic anemia. Myasthenia gravis can also be associated with pure red cell aplasia - less likely at this time. Given his history of B12 deficiency and myasthenia gravis cannot rule out the possibility of autoimmune thyroiditis related hypothyroidism. No overt evidence of blood loss  #2 History of recurrent VTE - on chronic anticoagulation -monitored by PCP  #3 History of myasthenia gravis - currently in  relapse started on IVIG and is on Mestinon. [facility] with [facility]y.  #4 chronic kidney disease #5 hypertension  Plan -Any follow-up with primary care physician for other medical comorbidities  H/H is excellent today.We will continue to monitor his labs and not pursue BMBX at this time The most likely cause of his anemia is multifactorial including IVIG, CKD, chronic anticoaugulation (? Intermittent GI related blood loss, currently iron levels excellent will follow). No need for ESA therapy currently   He will continue to follow with neurology for his myasthenia.   Labs reviewed. Results noted above.  I recommended him to see dermatology if the rash doesn't clear up within 4 weeks. I also advised him to take a low dose Benadryl.   Based on his blood work today I will space his future visits further out. He will follow up in 6 months.   Orders Placed This Encounter  Procedures  . CBC with Differential    Standing Status:   Future    Standing Expiration Date:   05/27/2017  . Comprehensive metabolic panel    Standing Status:   Future    Standing Expiration Date:   05/27/2017  . Lactate dehydrogenase    Standing Status:   Future    Standing Expiration Date:   05/27/2017   All of the patients questions were answered with apparent satisfaction. The patient knows to call the clinic with any problems, questions or concerns.  This document serves as a record of services personally performed by Ancil Linsey, MD. It was created on her behalf by Shirlean Mylar, a trained medical scribe. The creation of this record is based on the scribe's personal observations and the provider's statements to them. This document has been checked and approved by the attending provider.   I have reviewed the above documentation for accuracy and completeness and I agree with the above.  Kelby Fam. Penland, MD  05/27/2016 5:28 PM

## 2016-05-27 NOTE — Patient Instructions (Signed)
[facility] at [facility] Discharge Instructions  RECOMMENDATIONS MADE BY THE CONSULTANT AND ANY TEST RESULTS WILL BE SENT TO YOUR REFERRING PHYSICIAN.  You were seen today by Dr. Whitney Muse. Return in 5 months for labs and follow up.   Thank you for choosing [place] at [facility] to provide your oncology and hematology care.  To afford each patient quality time with our provider, please arrive at least 15 minutes before your scheduled appointment time.    If you have a lab appointment with the [facility] please come in thru the  Main Entrance and check in at the main information desk  You need to re-schedule your appointment should you arrive 10 or more minutes late.  We strive to give you quality time with our providers, and arriving late affects you and other patients whose appointments are after yours.  Also, if you no show three or more times for appointments you may be dismissed from the clinic at the providers discretion.     Again, thank you for choosing [facility].  Our hope is that these requests will decrease the amount of time that you wait before being seen by our physicians.       _____________________________________________________________  Should you have questions after your visit to [facility], please contact our office at [phone] between the hours of 8:30 a.m. and 4:30 p.m.  Voicemails left after 4:30 p.m. will not be returned until the following business day.  For prescription refill requests, have your pharmacy contact our office.       Resources For Cancer Patients and their Caregivers ? American Cancer Society: Can assist with transportation, wigs, general needs, runs Look Good Feel Better.        (501) 597-5714 ? Cancer Care: Provides financial assistance, online support groups, medication/co-pay assistance.  1-800-813-HOPE 587-807-1189) ? [place] Assists  [place] cancer patients and their families through emotional , educational and financial support.  (660)052-5923 ? [place] DSS Where to apply for food stamps, Medicaid and utility assistance. (402)373-1549 ? [facility]: Transportation to medical appointments. (248) 676-8746 ? Social Security Administration: May apply for disability if have a Stage IV cancer. 951-127-9031 (314)565-0217 ? [employer], Disability and Transit Services: Assists with nutrition, care and transit needs. [place] Support Programs: @10RELATIVEDAYS @ > Cancer Support Group  2nd Tuesday of the month 1pm-2pm, Journey Room  > Creative Journey  3rd Tuesday of the month 1130am-1pm, Journey Room  > Look Good Feel Better  1st Wednesday of the month 10am-12 noon, Journey Room (Call [place] to register [phone])

## 2016-05-31 DIAGNOSIS — I82401 Acute embolism and thrombosis of unspecified deep veins of right lower extremity: Secondary | ICD-10-CM | POA: Diagnosis not present

## 2016-05-31 DIAGNOSIS — I2699 Other pulmonary embolism without acute cor pulmonale: Secondary | ICD-10-CM | POA: Diagnosis not present

## 2016-06-17 ENCOUNTER — Encounter (HOSPITAL_COMMUNITY): Payer: Self-pay | Admitting: Hematology & Oncology

## 2016-07-08 DIAGNOSIS — M1712 Unilateral primary osteoarthritis, left knee: Secondary | ICD-10-CM | POA: Diagnosis not present

## 2016-07-08 DIAGNOSIS — M25562 Pain in left knee: Secondary | ICD-10-CM | POA: Diagnosis not present

## 2016-07-12 DIAGNOSIS — Z86711 Personal history of pulmonary embolism: Secondary | ICD-10-CM | POA: Diagnosis not present

## 2016-07-12 DIAGNOSIS — I82401 Acute embolism and thrombosis of unspecified deep veins of right lower extremity: Secondary | ICD-10-CM | POA: Diagnosis not present

## 2016-07-13 ENCOUNTER — Other Ambulatory Visit: Payer: Self-pay | Admitting: Neurology

## 2016-08-18 DIAGNOSIS — D2261 Melanocytic nevi of right upper limb, including shoulder: Secondary | ICD-10-CM | POA: Diagnosis not present

## 2016-08-18 DIAGNOSIS — Z6832 Body mass index (BMI) 32.0-32.9, adult: Secondary | ICD-10-CM | POA: Diagnosis not present

## 2016-08-18 DIAGNOSIS — R51 Headache: Secondary | ICD-10-CM | POA: Diagnosis not present

## 2016-08-18 DIAGNOSIS — L820 Inflamed seborrheic keratosis: Secondary | ICD-10-CM | POA: Diagnosis not present

## 2016-08-18 DIAGNOSIS — L989 Disorder of the skin and subcutaneous tissue, unspecified: Secondary | ICD-10-CM | POA: Diagnosis not present

## 2016-08-23 DIAGNOSIS — I82401 Acute embolism and thrombosis of unspecified deep veins of right lower extremity: Secondary | ICD-10-CM | POA: Diagnosis not present

## 2016-08-23 DIAGNOSIS — I2699 Other pulmonary embolism without acute cor pulmonale: Secondary | ICD-10-CM | POA: Diagnosis not present

## 2016-08-25 ENCOUNTER — Encounter: Payer: Self-pay | Admitting: Neurology

## 2016-08-25 ENCOUNTER — Encounter (INDEPENDENT_AMBULATORY_CARE_PROVIDER_SITE_OTHER): Payer: Self-pay

## 2016-08-25 ENCOUNTER — Ambulatory Visit (INDEPENDENT_AMBULATORY_CARE_PROVIDER_SITE_OTHER): Payer: Medicare Other | Admitting: Neurology

## 2016-08-25 DIAGNOSIS — R413 Other amnesia: Secondary | ICD-10-CM | POA: Diagnosis not present

## 2016-08-25 MED ORDER — TRAMADOL HCL 50 MG PO TABS: 50.0000 mg | ORAL_TABLET | Freq: Four times a day (QID) | ORAL | 3 refills | Status: DC | PRN

## 2016-08-25 NOTE — Progress Notes (Addendum)
Chief Complaint  Patient presents with  . Myasthenia Gravis    He is here with his wife, Calvin Sloan.  His last IVIG treatment was 05/12/16.  He is still taking Mestinon as prescribed.  He continues to have some shortness of breath and fatigue.  He has also started having headaches nearly daily.     Chief Complaint  Patient presents with  . Myasthenia Gravis    He is here with his wife, Calvin Sloan.  His last IVIG treatment was 05/12/16.  He is still taking Mestinon as prescribed.  He continues to have some shortness of breath and fatigue.  He has also started having headaches nearly daily.      PATIENT: Calvin Sloan DOB: 09/16/1933  REASON FOR VISIT: follow up- myasthenia gravis HISTORY FROM: patient  HISTORY OF PRESENT ILLNESS HISTORY 02/18/15 Calvin Sloan): Calvin Sloan is a 83 -year-old right-handed white married male with generalized seropositive myasthenia gravis characterized by bulbar weakness, shortness of breath , and dysphagia. Patient of Calvin Sloan  He was diagnosed in 10/2002 with a positive Tensilon test, positive acetylchoine receptor antibody,and had a positive ANA at 1-160 speckled pattern. Initially he had right eye ptosis and subsequently, double vision, fatigue with chewing, dysphagia, and upper and lower extremity weakness. He was initially treated with Mestinon bromide in June 2004, which was associated with GI side effects.  Prednisone was started in 03/29/2003 and CellCept 05/2003. He developed increased jitteriness, rash, and cramps on CellCept which was discontinued 10/2003. He had progressive bulbar weakness and was admitted 12/2003 for IV IgG therapy which required a PEG tube for dysphagia transiently. He was started on Imuran. 01/17/2004 he was readmitted for 2 days of IV IgG because of shortness of breath. He has not been hospitalized since 01/2004.   He has been slowly tapered off prednisone 09/2007 and was on Imuran and Mestinon bromide.   He had B12 deficiency and was receiving  B12 shots ,but B12 levels off injections have been normal and B12 shots were discontinued.  He has a history of vague visual loss on the left of unknown etiology. He denies swallowing problems, speech problems, voice changes, focal weakness, or double vision.  He is independent in activities of daily living and takes one nap per day.He denies memory loss but his wife states that he does have memory loss. His last blood studies in my office 05/27/10 were normal except a low white blood cell count of 3900.  His dermatologist Calvin Sloan was concerned that the Imuran is associated with increased risk of skin cancer and the imuran was discontinued in 12/30/2010. His skin cancer (basal cell cancer) has improved since his imuran was stopped.  He has noted no change In myasthenic symptoms. His skin has improved. His medication for myasthenia is mesinon bromide 60 milligrams one and one half tablets 4 times a day. He has shortness of breath since his pulmonary emboli in 2003, also complains of excessive fatigue.   He is allergic to aspirin, GI side effect, gastric ulcer, and is on Plavix for a history of TIAs with right arm weakness He has swallowing problems at the beginning of the meal with liquids that improves as the meal continues.   He had right CTS release in May 2014 by Calvin Sloan.  He complains of shortness of breath with walking, bending over to tire his shoed, due to PE 2003, pulmnolgist folllowed up, he does not like to chew on tough beef, he denies swallowing difficulty, no double  vision, no droopy eyelid, no significant bleeding muscle weakness. He is active in yard, shop, threadmill   UPDATE Jan 16th 2015: He is now tapering off Mestinon, there was no significant change in his functional status, he denies double vision, no ptosis, continue complains of shortness of breath with exertion no significant and muscle weakness, he has to wash down his food sometimes, but no significant  swelling, or chewing difficulty, no dysarthria  UPDATE July 16th 2015: He is no longer mestinon, he does complains of increased breathing difficulty from prolonged walking, no chewing difficulty, no double vision, no ptosis.  Laboratory continue to demonstrate positive acetylcholine binding, and modulating antibodies. He did reported that his skin cancer has improved after stopping imuran, could not tolerate cellcept per record. Previously he responded to IVIG very well.  UPDATE Sept 25th 2015: He was stated on prednisone 10mg  qday, and mestinon 60mg  tid in June 2015, which did help his fatigue, shortness of breath, wife reported 50% improvement, he has increased appetite  But he was admitted to [facility] hospital in Sept 17th for bilateral PEs, right leg DVT, he is started on coumadin, bridge with Lovenox, Calvin Sloan is monitoring his INR  He denies double vision, no ptosis, no limb muscle weakness, no dysphagia  UPDATE April 4th 2016: He is on coumadin for PE, INR was monitored by Calvin Sloan, his myasthenia gravis is under good control, is taking prednisone 10 mg every day, he has surgery in Nov 2015 for right leg cellulitis.  He only does little around his house, he has no double vision, SOB due to previous PE,   UPDATE Feb 18 2015: Previous laboratory evaluation showed mild B12 deficiency with level of 204, He did receive IM B12 supplement at his primary care Calvin Sloan office for a while, now with normal repeat laboratory evaluation, injection has stopped He has mild choking difficulty, this has been going on since Sloan 2016, not getting worse, he has more trouble with liquid. He complains of shortness of breath when bending over. He denies significant gait difficulty, no breathing difficulty otherwise, he denies double vision, no ptosis,, No chewing difficulties   UPDATE May 22 2015: He came in last visit April 16 2015 complains of worsening blurry vision, double vision,  swallowing difficulties, trouble talking, he reported 50% weakness compared to baseline, difficulty getting up from seated position, difficulty chewing, worsening shortness of breath, on examination, he was noted to have increased weakness compared to previous examination, he had moderate eye-closure cheek puff, mild neck flexion weakness, mild bilateral shoulder abduction bilateral hip flexion weakness.  He was treated with IVIG 400 mg/kg every day for 5 days from December 11 to May 02 2015, his vision has much improved, he can walk better, but he continue have swallowing difficulty, dysarthria, he is also taking prednisone 40 mg every day, increase his Mestinon 60 mg to 3-4 tablets daily  He was not able to tolerate immunosuppression treatment in the past due to skin cancer  UPDATE Jul 07 2015:  I have called his wife, pulmonary function test in June 05 2015.  1, FVC is 3.1 5, 87%, this is normal 2. FEV1 is 2.1 6, 77%, with a percent FEV1 of 68, this indicated mild to moderate primary small  obstruction in this patient was a history of cigarette smoking, large elderly flow rates are generally normal  3. Flow volume loop is unremarkable 4, lung volumes indicate some evidence of air trapping with an increased residual volume, the  TLC is normal, the increased residual volume confirms the presence of airflow obstruction  5, DLCO is 19.0, 90%, this is normal 6, elderly resistant 3.7 2, 265%, this is increased  Wife reported, he recently had swallowing study, there was no significant abnormality found, his swallowing, breathing, symptoms overall has improved  He did think that IVIG treatment in Dec 11-16th 2016 has helped his blurry vision, it is gone, he still feel straggled sometimes, he has shortness with exertion and bending over. His talking is much better.  UPDATE May 22nd 2017: He continue complains of shortness of breath with minimum exertion, no diplopia, no double vision, mild  dysphasia,  He had a history of right carpal tunnel release surgery in the past, which has been helpful, now complains of left first 3 finger paresthesia, EMG nerve conduction studies was planned at local hospital  UPDATE July 6th 2017: He stopped taking Mestinon around May 20 third 2017, shortly afterwards, he noticed double vision, he clearly describe eighth of binocular double vision, sometimes vertigo, sometimes horizontal, he went on higher dose of Mestinon 60 mg 4 times a day without improving his symptoms, he slept on a bed with 30 head raise, with no significant breathing difficulty, he could not eat his dinner on July 5th 2017 due to chewing swallowing difficulty, he could not fly flat sleeping, mildly unsteady gait, increased fatigue,  UPDATE Sloan 17th 2017: Last IVIG treatment was December 05 2015, he did very well for a while, was able to cut grass, trim his lawn, it did helped him for at least 2 weeks, then he had passing out   I reviewed laboratory evaluation in Sloan 2017, normal B12, ferritin level, iron binding capacity was within normal limit, low hemoglobin 10 point 8, creatinine was elevated 1.53, glucose was 125  Previous laboratory evaluation in 2016 showed normal folic acid 12 point 5,low normal B12 level 266, hemoglobin in January 2015 was 14 point 3  He will be evaluated by hematologist/oncologist Dr. Mervin Kung in September 6th 2017, at [facility]  He complains of low back pain since he fell you Sloan 1st 2017, has been taking intermittent Tylenol,  He complains of difficulty swallowing, both liquid, and solid food, no double vision, He is taking mestinon 60mg  4 times a day, which did help him.  Update January 29 2016: I have reviewed his hematologist Dr. Grier Mitts note on September 6th 2017, normocytic microchromic anemia, normal WBC, platelet count, likely from chronic kidney disease, history of fetal deficiency, is on B12 supplement, there was a consideration of  low level hemolytic anemia from IVIG infusion, no overt evidence of blood loss  I reviewed laboratory evaluation in September 2017, parietal cell antibody IgG was negative, intrinsic factor negative, mild elevated kappa free light chain, Hg 13.8, creat 1.38, GFR 47, ferritin 163, normal TSh 1.5, elevated vitamin B12 1480  He complains more fatigue, more swallowing difficulty, decreased po intake, he could not drinking water because worry about the choking episode, increased shortness of breath with exertion, intermittent blurry version, difficulty closing his eyes.  He is now taking mestinon 60mg  qid, no significant side effect, was not sure about the benefit.  Also reviewed swallowing evaluation on June 06 2015 at [facility], there was mild oropharyngeal dysphagia, characterized by spillover to the valleculae and the piriform sinus secondary to reduced oral motor coordination   patient was felt to safe to consume a regular diet with a full range of liquid with  recommendation of  upright for food by mouth, and 45 minutes after meals, head flexed slightly forward , swallow at slower rate  UPDATE Oct 16th 2017: I reviewed laboratory in October 2017, immune of fixative electrophoresis showed mild elevated Ig G2.8, otherwise was normal, mild anemia hemoglobin 11.7, mild elevated kappa.free light chain 26, mild elevated creatinine 1.38, normal ferritin 163, normal iron panel, TSH, folic acid, H96 2229,  Last ivig was in Sept, it has helped him 80%,  he can chew and swallow, he continue complains of blurry vision,  UPDATE May 05 2016: He is overall much improved, but still complains of SOB, is at his baseline, last infusion was on Nov 27, 28th, he will have more infusion in December, he has mild dysarthria, slight dysphagia will see hematologist Dr. Whitney Muse in January 2018,   UPDATE August 25 2016: His last IVIG was on May 12 2016, he think is has helped him, he still taking Mestinon  4 times a day, last dose is before bedtime, he continue has mild breathing difficulty, he uses one pillow, lie on his side, sleep through the night,  He continue complains of shortness of breath with minimum exertion  He also noted mild memory loss, mild hearing loss, he also complains of new onset of headaches over the past few months  REVIEW OF SYSTEMS: Out of a complete 14 system review of symptoms, the patient complains only of the following symptoms, and all other reviewed systems,  are negative Headache, shortness of breath   ALLERGIES: Allergies  Allergen Reactions  . Aspirin   . Shellfish Allergy     HOME MEDICATIONS: Outpatient Medications Prior to Visit  Medication Sig Dispense Refill  . pyridostigmine (MESTINON) 60 MG tablet TAKE 1 TABLET FOUR TIMES DAILY 360 tablet 3  . ranitidine (ZANTAC) 75 MG tablet TAKE 1 TABLET TWICE DAILY 180 tablet 3  . valsartan-hydrochlorothiazide (DIOVAN-HCT) 320-25 MG per tablet Take 1 tablet by mouth daily.    [name] warfarin (COUMADIN) 5 MG tablet 5 mg. Taking 5mg  Mon, Wed, Fri.  Taking 2.5mg  Sun, Tues, Thurs, Sat.     No facility-administered medications prior to visit.     PAST MEDICAL HISTORY: Past Medical History:  Diagnosis Date  . B12 deficiency   . Essential tremor   . Hypertension   . Memory deficit   . Myasthenia gravis ([place])    since 2004  . Pulmonary emboli ([place])    in Nov 2003  . Skin cancer     PAST SURGICAL HISTORY: Past Surgical History:  Procedure Laterality Date  . CARPAL TUNNEL RELEASE Right 09/14/2012   Procedure: CARPAL TUNNEL RELEASE;  Surgeon: Cammie Sickle., MD;  Location: [place];  Service: Orthopedics;  Laterality: Right;  . CARPAL TUNNEL RELEASE Left 11/04/2015   Procedure: LEFT CARPAL TUNNEL RELEASE, EXTENDED INCISION;  Surgeon: Daryll Brod, MD;  Location: [facility] [facility];  Service: Orthopedics;  Laterality: Left;  ANESTHESIA: IV REGIONAL UPPER ARM  . CHOLECYSTECTOMY  2012    [place]  . COLONOSCOPY    . FOOT SURGERY  1966   lt orif foot  . SKIN CANCER EXCISION      FAMILY HISTORY: Family History  Problem Relation Age of Onset  . Heart disease Mother   . Heart disease Father     SOCIAL HISTORY: Social History   Social History  . Marital status: Married    Spouse name: Calvin Sloan  . Number of children: 2  . Years of education: 59  Occupational History  . Plant     Retired   Social History Main Topics  . Smoking status: Former Smoker    Quit date: 05/17/1968  . Smokeless tobacco: Never Used  . Alcohol use No  . Drug use: No  . Sexual activity: Not on file   Other Topics Concern  . Not on file   Social History Narrative   He lives with his wife Calvin Sloan(, has 2 children, retired. Hard of hearing.   Right handed.   Caffeine- None      PHYSICAL EXAM  Vitals:   08/25/16 1127  BP: 134/77  Pulse: 60  Weight: 215 lb 8 oz (97.8 kg)  Height: 5\' 9"  (1.753 m)   Body mass index is 31.82 kg/m.   PHYSICAL EXAMNIATION:  Gen: NAD, conversant, well nourised, obese, well groomed                     Cardiovascular: Regular rate rhythm, no peripheral edema, warm, nontender. Eyes: Conjunctivae clear without exudates or hemorrhage Neck: Supple, no carotid bruise. Pulmonary: Clear to auscultation bilaterally   NEUROLOGICAL EXAM:  MENTAL STATUS: Speech: MMSE 28/30    Speech is normal; fluent and spontaneous with normal comprehension.  Cognition:     Orientation to time, place and person, he is not oriented to date     Normal recent and remote memory      Attention span and concentration: has difficulty spelling world backwards     Normal Language, naming, repeating,spontaneous speech     Fund of knowledge   CRANIAL NERVES: CN II: Visual fields are full to confrontation. Fundoscopic exam is normal with sharp discs and no vascular changes. Pupils are round equal and briskly reactive to light. CN III, IV, VI: extraocular movement are normal. No   ptosis, cover and uncover testing showed bilateral exophoria CN V: Facial sensation is intact to pinprick in all 3 divisions bilaterally. Corneal responses are intact.  CN VII: He has mild eye closure, cheek puff weakness. CN VIII: Hearing is normal to rubbing fingers CN IX, X: Palate elevates symmetrically. Phonation is normal. CN XI: Head turning and shoulder shrug are intact CN XII: Tongue is midline with normal movements and no atrophy.  MOTOR:  He has no neck flexion weakness, no significant limb muscle weakness.  REFLEXES: Reflexes are 2+ and symmetric at the biceps, triceps, knees, and ankles. Plantar responses are flexor.  SENSORY: Intact to light touch, pinprick, position sense, and vibration sense are intact in fingers and toes.  COORDINATION: Rapid alternating movements and fine finger movements are intact. There is no dysmetria on finger-to-nose and heel-knee-shin.    GAIT/STANCE: He is able to get up from seated position arm crossed but with effort,  Mildly antalgic gait.  DIAGNOSTIC DATA (LABS, IMAGING, TESTING) - I reviewed patient records, labs, notes, testing and imaging myself where available.   ASSESSMENT AND PLAN 83 y.o. year old male   Seropositive Myasthenia gravis with exacerbation since May 2017.  with dysphagia, mild breathing difficulty, mild to moderate bulbar limb muscle weakness, worsening double vision, ptosis  He responded very well to previous IVIG treatment, most recent one was in December 05 2015,  Not a candidate for long term immunosuppressive treatment due to his recurrent large area skin cancer  Repeat IVIG 2 g/kg= 0.4 g/kgx5 5 days in September 2017,  then maintenance dose 1 g/kg every 3-4 weeks, repeat 5 times, last infusion was on May 12 2016  Continue Mestinon 60  mg 4 times a day    Shortness of breath  Refer him to pulmonary function test, showed evidence of small airway construction,  Multifactorial, weakness due to myasthenia gravis  exacerbation, history of PE, small airway construction,  Continue Coumadin treatment for pulmonary emboli  New onset headache mild cognitive impairment  CT brain without contrast  Laboratory evaluation to rule out treatable etiology  Tramadol as needed for headache     Marcial Pacas, M.D. Ph.D.  [facility] [address], [place], [place] [address] [phone]

## 2016-08-26 ENCOUNTER — Encounter: Payer: Self-pay | Admitting: *Deleted

## 2016-08-26 LAB — VITAMIN D 25 HYDROXY (VIT D DEFICIENCY, FRACTURES): Vit D, 25-Hydroxy: 18 ng/mL — ABNORMAL LOW (ref 30.0–100.0)

## 2016-08-26 LAB — TSH: TSH: 2.01 u[IU]/mL (ref 0.450–4.500)

## 2016-08-26 LAB — SEDIMENTATION RATE: SED RATE: 9 mm/h (ref 0–30)

## 2016-08-26 LAB — C-REACTIVE PROTEIN: CRP: 2.3 mg/L (ref 0.0–4.9)

## 2016-09-10 ENCOUNTER — Telehealth: Payer: Self-pay | Admitting: Neurology

## 2016-09-10 ENCOUNTER — Ambulatory Visit (HOSPITAL_COMMUNITY)
Admission: RE | Admit: 2016-09-10 | Discharge: 2016-09-10 | Disposition: A | Discharge status: Home / Self Care | Payer: Medicare Other | Source: Ambulatory Visit | Attending: Neurology | Admitting: Neurology

## 2016-09-10 DIAGNOSIS — I639 Cerebral infarction, unspecified: Secondary | ICD-10-CM | POA: Diagnosis not present

## 2016-09-10 DIAGNOSIS — R413 Other amnesia: Secondary | ICD-10-CM | POA: Insufficient documentation

## 2016-09-10 DIAGNOSIS — R9082 White matter disease, unspecified: Secondary | ICD-10-CM | POA: Diagnosis not present

## 2016-09-10 DIAGNOSIS — R51 Headache: Secondary | ICD-10-CM | POA: Diagnosis not present

## 2016-09-10 NOTE — Telephone Encounter (Signed)
Please call patient, CT head showed evidence of previous left cerebellum, supratentorium small vessel disease, no acute abnormality noticed  IMPRESSION: 1. Remote posterior and inferior left cerebellar infarcts. 2. Mild age appropriate atrophy and white matter disease. 3. No acute or focal intracranial pathology to explain the patient's symptoms.

## 2016-09-13 MED ORDER — BUTALBITAL-APAP-CAFFEINE 50-325-40 MG PO TABS: ORAL_TABLET | ORAL | 3 refills | Status: DC

## 2016-09-13 NOTE — Addendum Note (Signed)
Addended by: Noberto Retort C on: 09/13/2016 04:03 PM   Modules accepted: Orders

## 2016-09-13 NOTE — Addendum Note (Signed)
Addended by: Noberto Retort C on: 09/13/2016 02:07 PM   Modules accepted: Orders

## 2016-09-13 NOTE — Telephone Encounter (Addendum)
Dr. [name] has provided patient with a Fioricet prescription.  It has been faxed to [facility] with MD instructions.  Notified patient's wife by voicemail that new rx is at the pharmacy.  Provided our number to call back with any questions.

## 2016-09-13 NOTE — Telephone Encounter (Signed)
Spoke to patient's wife - she is aware of results.  Tramadol caused him to have extreme nausea and he would like an alternate medication for his headaches.  They use [facility].

## 2016-10-04 ENCOUNTER — Other Ambulatory Visit: Payer: Self-pay | Admitting: Neurology

## 2016-10-04 DIAGNOSIS — I82401 Acute embolism and thrombosis of unspecified deep veins of right lower extremity: Secondary | ICD-10-CM | POA: Diagnosis not present

## 2016-10-22 ENCOUNTER — Other Ambulatory Visit (HOSPITAL_COMMUNITY): Payer: Self-pay | Admitting: *Deleted

## 2016-10-22 DIAGNOSIS — D649 Anemia, unspecified: Secondary | ICD-10-CM

## 2016-10-25 ENCOUNTER — Encounter (HOSPITAL_COMMUNITY): Payer: Medicare Other | Attending: Oncology

## 2016-10-25 ENCOUNTER — Encounter (HOSPITAL_BASED_OUTPATIENT_CLINIC_OR_DEPARTMENT_OTHER): Payer: Medicare Other | Admitting: Oncology

## 2016-10-25 ENCOUNTER — Ambulatory Visit (HOSPITAL_COMMUNITY): Payer: PRIVATE HEALTH INSURANCE

## 2016-10-25 ENCOUNTER — Encounter (HOSPITAL_COMMUNITY): Payer: Self-pay | Admitting: Oncology

## 2016-10-25 VITALS — BP 139/68 | HR 45 | Temp 98.0°F | Resp 18 | Wt 217.1 lb

## 2016-10-25 DIAGNOSIS — N189 Chronic kidney disease, unspecified: Secondary | ICD-10-CM | POA: Diagnosis not present

## 2016-10-25 DIAGNOSIS — N183 Chronic kidney disease, stage 3 unspecified: Secondary | ICD-10-CM

## 2016-10-25 DIAGNOSIS — D649 Anemia, unspecified: Secondary | ICD-10-CM | POA: Insufficient documentation

## 2016-10-25 DIAGNOSIS — I10 Essential (primary) hypertension: Secondary | ICD-10-CM | POA: Diagnosis not present

## 2016-10-25 DIAGNOSIS — G7000 Myasthenia gravis without (acute) exacerbation: Secondary | ICD-10-CM | POA: Diagnosis not present

## 2016-10-25 LAB — CBC WITH DIFFERENTIAL/PLATELET
BASOS ABS: 0.1 10*3/uL (ref 0.0–0.1)
BASOS PCT: 1 %
EOS ABS: 0.1 10*3/uL (ref 0.0–0.7)
Eosinophils Relative: 1 %
HCT: 43.5 % (ref 39.0–52.0)
Hemoglobin: 14.4 g/dL (ref 13.0–17.0)
Lymphocytes Relative: 19 %
Lymphs Abs: 1.1 10*3/uL (ref 0.7–4.0)
MCH: 30.3 pg (ref 26.0–34.0)
MCHC: 33.1 g/dL (ref 30.0–36.0)
MCV: 91.6 fL (ref 78.0–100.0)
MONO ABS: 0.5 10*3/uL (ref 0.1–1.0)
Monocytes Relative: 8 %
Neutro Abs: 4 10*3/uL (ref 1.7–7.7)
Neutrophils Relative %: 71 %
Platelets: 191 10*3/uL (ref 150–400)
RBC: 4.75 MIL/uL (ref 4.22–5.81)
RDW: 13.2 % (ref 11.5–15.5)
WBC: 5.6 10*3/uL (ref 4.0–10.5)

## 2016-10-25 LAB — COMPREHENSIVE METABOLIC PANEL
ALBUMIN: 4.1 g/dL (ref 3.5–5.0)
ALT: 20 U/L (ref 17–63)
AST: 26 U/L (ref 15–41)
Alkaline Phosphatase: 55 U/L (ref 38–126)
Anion gap: 7 (ref 5–15)
BUN: 16 mg/dL (ref 6–20)
CO2: 30 mmol/L (ref 22–32)
CREATININE: 1.5 mg/dL — AB (ref 0.61–1.24)
Calcium: 9.4 mg/dL (ref 8.9–10.3)
Chloride: 98 mmol/L — ABNORMAL LOW (ref 101–111)
GFR calc Af Amer: 49 mL/min — ABNORMAL LOW (ref >60)
GFR calc non Af Amer: 42 mL/min — ABNORMAL LOW (ref >60)
GLUCOSE: 134 mg/dL — AB (ref 65–99)
POTASSIUM: 4.7 mmol/L (ref 3.5–5.1)
SODIUM: 135 mmol/L (ref 135–145)
Total Bilirubin: 0.8 mg/dL (ref 0.3–1.2)
Total Protein: 7.3 g/dL (ref 6.5–8.1)

## 2016-10-25 LAB — LACTATE DEHYDROGENASE: LDH: 162 U/L (ref 98–192)

## 2016-10-25 NOTE — Progress Notes (Signed)
HEMATOLOGY/ONCOLOGY Progress Note  Date of Service: 10/25/2016  Patient Care Team: [name], MD as PCP - General (Family Medicine)  CHIEF COMPLAINTS/PURPOSE OF CONSULTATION:  Anemia  HISTORY OF PRESENTING ILLNESS:   Calvin Sloan is a wonderful 83 y.o. male who has been referred to [place] by Dr .[name], MD for evaluation and management of Anemia.  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  Mr. Hynek is accompanied by his wife.   I personally reviewed and went over laboratory studies with the patient.  Wife states he is still getting his IVIG for his myasthenia. He is doing better.   Reports he had a good holiday.Appetite is good.  patient is here for fr follow-up regarding anemia Hemoglobin has been reviewed and is 14.4 g  MEDICAL HISTORY:  Past Medical History:  Diagnosis Date  . B12 deficiency   . Essential tremor   . Hypertension   . Memory deficit   . Myasthenia gravis ([place])    since 2004  . Pulmonary emboli ([place])    in Nov 2003  . 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   SURGICAL HISTORY: Past Surgical History:  Procedure Laterality Date  . CARPAL TUNNEL RELEASE Right 09/14/2012   Procedure: CARPAL TUNNEL RELEASE;  Surgeon: Cammie Sickle., MD;  Location: [facility];  Service: Orthopedics;  Laterality: Right;  . CARPAL TUNNEL RELEASE Left 11/04/2015   Procedure: LEFT CARPAL TUNNEL RELEASE, EXTENDED INCISION;  Surgeon: Daryll Brod, MD;  Location: [facility];  Service: Orthopedics;  Laterality: Left;  ANESTHESIA: IV  REGIONAL UPPER ARM  . CHOLECYSTECTOMY  2012   [facility]  . COLONOSCOPY    . FOOT SURGERY  1966   lt orif foot  . SKIN CANCER EXCISION      SOCIAL HISTORY: Social History   Social History  . Marital status: Married    Spouse name: [name]  . Number of children: 2  . Years of education: 12   Occupational History  . Plant     Retired   Social History Main Topics  . Smoking status: Former Smoker    Quit date: 05/17/1968  . Smokeless tobacco: Never Used  . Alcohol use No  . Drug use: No  . Sexual activity: Not on file   Other Topics Concern  . Not on file   Social History Narrative   He lives with his wife [name](, has 2 children, retired. Hard of hearing.   Right handed.   Caffeine- None    FAMILY HISTORY: Family History  Problem Relation Age of Onset  . Heart disease Mother   . Heart disease Father     ALLERGIES:  is allergic to aspirin; shellfish allergy; and tramadol.  MEDICATIONS:  Current Outpatient Prescriptions  Medication Sig Dispense Refill  . butalbital-acetaminophen-caffeine (FIORICET, ESGIC) 50-325-40 MG tablet Take one tablet daily only as needed for headaches.  #12/30. 12 tablet 3  . Cholecalciferol (VITAMIN D3) 1000 units CAPS Take 1,000 Units by mouth daily.    [name] pyridostigmine (MESTINON) 60 MG tablet TAKE 1 TABLET FOUR TIMES DAILY 360 tablet 3  .  ranitidine (ZANTAC) 75 MG tablet TAKE 1 TABLET TWICE DAILY 180 tablet 3  . traMADol (ULTRAM) 50 MG tablet Take 1 tablet (50 mg total) by mouth every 6 (six) hours as needed. 30 tablet 3  . valsartan-hydrochlorothiazide (DIOVAN-HCT) 320-25 MG per tablet Take 1 tablet by mouth daily.    [name] warfarin (COUMADIN) 5 MG tablet 5 mg. Taking 5mg  Mon, Wed, Fri.  Taking 2.5mg  Sun, Tues, Thurs, Sat.     No current facility-administered medications for this visit.     REVIEW OF SYSTEMS:   Review of Systems  Constitutional: Negative.   HENT: Negative.   Eyes: Negative.   Respiratory: Negative.   Cardiovascular:  Negative.   Genitourinary: Negative.   Skin: Positive for rash (on neck).  Neurological: Negative.   Endo/Heme/Allergies: Negative.   Psychiatric/Behavioral: Negative.   All other systems reviewed and are negative. 14 point review of systems was performed and is negative except as detailed under history of present illness and above   PHYSICAL EXAMINATION: ECOG PERFORMANCE STATUS: 2 - Symptomatic, <50% confined to bed    Vitals:   10/25/16 1024  BP: 139/68  Pulse: (!) 45  Resp: 18  Temp: 98 F (36.7 C)   Filed Weights   10/25/16 1024  Weight: 217 lb 1.6 oz (98.5 kg)   .Body mass index is 32.06 kg/m.   Physical Exam  Constitutional: He is oriented to person, place, and time.  Able to sit back up on exam table with assistance. Needed assistance to get down from exam table.   HENT:  Head: Normocephalic and atraumatic.  Mouth/Throat: Oropharynx is clear and moist.  Eyes: Conjunctivae and EOM are normal. Pupils are equal, round, and reactive to light.  Neck: Normal range of motion. Neck supple.  Cardiovascular: Normal rate, regular rhythm and normal heart sounds.   Pulmonary/Chest: Effort normal and breath sounds normal.  Abdominal: Soft. Bowel sounds are normal.  Musculoskeletal: Normal range of motion.  Neurological: He is alert and oriented to person, place, and time.  Skin: Skin is warm and dry. Rash (on neck) noted.  Nursing note and vitals reviewed.   LABORATORY DATA:  I have reviewed the data as listed  . CBC Latest Ref Rng & Units 10/25/2016 05/27/2016 02/20/2016  WBC 4.0 - 10.5 K/uL 5.6 4.8 5.0  Hemoglobin 13.0 - 17.0 g/dL 14.4 13.5 11.7(L)  Hematocrit 39.0 - 52.0 % 43.5 39.1 33.1(L)  Platelets 150 - 400 K/uL 191 191 157    . CMP Latest Ref Rng & Units 10/25/2016 05/27/2016 01/21/2016  Glucose 65 - 99 mg/dL 134(H) 97 94  BUN 6 - 20 mg/dL 16 19 13   Creatinine 0.61 - 1.24 mg/dL 1.50(H) 1.54(H) 1.38(H)  Sodium 135 - 145 mmol/L 135 136 139  Potassium 3.5 - 5.1  mmol/L 4.7 4.3 4.4  Chloride 101 - 111 mmol/L 98(L) 102 102  CO2 22 - 32 mmol/L 30 29 30   Calcium 8.9 - 10.3 mg/dL 9.4 9.1 9.7  Total Protein 6.5 - 8.1 g/dL 7.3 7.8 7.8  Total Bilirubin 0.3 - 1.2 mg/dL 0.8 1.0 0.8  Alkaline Phos 38 - 126 U/L 55 51 71  AST 15 - 41 U/L 26 30 27   ALT 17 - 63 U/L 20 26 21     Results for ABIR, CRAINE (MRN [id number])   Ref. Range 01/21/2016 11:09 01/21/2016 11:10  LDH Latest Ref Range: 98 - 192 U/L  148  Iron Latest Ref Range: 45 - 182 ug/dL 94   UIBC Latest Units:  ug/dL 213   TIBC Latest Ref Range: 250 - 450 ug/dL 307   Saturation Ratios Latest Ref Range: 17.9 - 39.5 % 31   Ferritin Latest Ref Range: 24 - 336 ng/mL 163   Intrinsic Factor Latest Ref Range: 0.0 - 1.1 AU/mL  1.1  Folate, Hemolysate Latest Ref Range: Not Estab. ng/mL >620.0   Folate, RBC Latest Ref Range: >498 ng/mL >1,516   Vitamin B12 Latest Ref Range: 180 - 914 pg/mL 1,480 (H)     RADIOGRAPHIC STUDIES: I have personally reviewed the radiological images as listed and agreed with the findings in the report. No results found.  ASSESSMENT & PLAN:  Anemia, multifactorial Myasthenia gravis B12 deficiency, on oral B12 CKD, stage 3 Chronic Anticoagulation History of recurrent VTE  83 year old Caucasian male with  #1 normocytic normochromic anemia. Normal WBC and platelet counts.Note that H/H are WNL today. Patient will 10.8 and MCV of 94 based on outside outside labs from 12/18/2015. This could likely be from his chronic kidney disease. He has had a history of B12 deficiency and is on B12 oral replacement at this time. Has had a history of myasthenia gravis since 2004. Was in remission for a long time and had relapse over the last 1-2 years. Currently was restarted on IVIG which can cause some low-level hemolytic anemia. Myasthenia gravis can also be associated with pure red cell aplasia - less likely at this time. Given his history of B12 deficiency and myasthenia gravis cannot rule out  the possibility of autoimmune thyroiditis related hypothyroidism. No overt evidence of blood loss  #2 History of recurrent VTE - on chronic anticoagulation -monitored by PCP  #3 History of myasthenia gravis - currently in relapse started on IVIG and is on Mestinon. [facility] with [facility]y.  #4 chronic kidney disease #5 hypertension  Plan -Any follow-up with primary care physician for other medical comorbidities  H/H is excellent today.We will continue to monitor his labs and not pursue BMBX at this time The most likely cause of his anemia is multifactorial including IVIG, CKD, chronic anticoaugulation (? Intermittent GI related blood loss, currently iron levels excellent will follow). No need for ESA therapy currently   He will continue to follow with neurology for his myasthenia.   Labs reviewed. Results noted above.  I recommended him to see dermatology if the rash doesn't clear up within 4 weeks. I also advised him to take a low dose Benadryl.   Based on hemoglobin which is stable over  last 6 month I recommended that patient can be followed by primary care physician. If there is a drop in hemoglobin again we will be happy to see him back  No further appointment has been given  10/25/2016 10:33 AM

## 2016-10-26 ENCOUNTER — Encounter: Payer: Self-pay | Admitting: Adult Health

## 2016-10-26 ENCOUNTER — Ambulatory Visit (INDEPENDENT_AMBULATORY_CARE_PROVIDER_SITE_OTHER): Payer: Medicare Other | Admitting: Adult Health

## 2016-10-26 VITALS — BP 116/60 | HR 64 | Resp 20 | Ht 69.0 in | Wt 220.8 lb

## 2016-10-26 DIAGNOSIS — G7000 Myasthenia gravis without (acute) exacerbation: Secondary | ICD-10-CM | POA: Diagnosis not present

## 2016-10-26 NOTE — Progress Notes (Signed)
PATIENT: Calvin Sloan DOB: Jan 25, 1934  REASON FOR VISIT: follow up- myasthenia gravis HISTORY FROM: patient  HISTORY OF PRESENT ILLNESS: HISTORY 02/18/15 Calvin Sloan): [name] is a 83 -year-old right-handed white married male with generalized seropositive myasthenia gravis characterized by bulbar weakness, shortness of breath , and dysphagia. Patient of [name]  He was diagnosed in 10/2002 with a positive Tensilon test, positive acetylchoine receptor antibody,and had a positive ANA at 1-160 speckled pattern. Initially he had right eye ptosis and subsequently, double vision, fatigue with chewing, dysphagia, and upper and lower extremity weakness. He was initially treated with Mestinon bromide in June 2004, which was associated with GI side effects.  Prednisone was started in 03/29/2003 and CellCept 05/2003. He developed increased jitteriness, rash, and cramps on CellCept which was discontinued 10/2003. He had progressive bulbar weakness and was admitted 12/2003 for IV IgG therapy which required a PEG tube for dysphagia transiently. He was started on Imuran. 01/17/2004 he was readmitted for 2 days of IV IgG because of shortness of breath. He has not been hospitalized since 01/2004.   He has been slowly tapered off prednisone 09/2007 and was on Imuran and Mestinon bromide.   He had B12 deficiency and was receiving B12 shots ,but B12 levels off injections have been normal and B12 shots were discontinued.  He has a history of vague visual loss on the left of unknown etiology. He denies swallowing problems, speech problems, voice changes, focal weakness, or double vision.  He is independent in activities of daily living and takes one nap per day.He denies memory loss but his wife states that he does have memory loss. His last blood studies in my office 05/27/10 were normal except a low white blood cell count of 3900.  His dermatologist [name] was concerned that the Imuran is  associated with increased risk of skin cancer and the imuran was discontinued in 12/30/2010. His skin cancer (basal cell cancer) has improved since his imuran was stopped.  He has noted no change In myasthenic symptoms. His skin has improved. His medication for myasthenia is mesinon bromide 60 milligrams one and one half tablets 4 times a day. He has shortness of breath since his pulmonary emboli in 2003, also complains of excessive fatigue.   He is allergic to aspirin, GI side effect, gastric ulcer, and is on Plavix for a history of TIAs with right arm weakness He has swallowing problems at the beginning of the meal with liquids that improves as the meal continues.   He had right CTS release in May 2014 by [name].  He complains of shortness of breath with walking, bending over to tire his shoed, due to PE 2003, pulmnolgist folllowed up, he does not like to chew on tough beef, he denies swallowing difficulty, no double vision, no droopy eyelid, no significant bleeding muscle weakness. He is active in yard, shop, threadmill   UPDATE Jan 16th 2015: He is now tapering off Mestinon, there was no significant change in his functional status, he denies double vision, no ptosis, continue complains of shortness of breath with exertion no significant and muscle weakness, he has to wash down his food sometimes, but no significant swelling, or chewing difficulty, no dysarthria  UPDATE July 16th 2015: He is no longer mestinon, he does complains of increased breathing difficulty from prolonged walking, no chewing difficulty, no double vision, no ptosis.  Laboratory continue to demonstrate positive acetylcholine binding, and modulating antibodies. He did reported that his  skin cancer has improved after stopping imuran, could not tolerate cellcept per record. Previously he responded to IVIG very well.  UPDATE Sept 25th 2015: He was stated on prednisone 10mg  qday, and mestinon 60mg  tid in June  2015, which did help his fatigue, shortness of breath, wife reported 50% improvement, he has increased appetite  But he was admitted to [facility] hospital in Sept 17th for bilateral PEs, right leg DVT, he is started on coumadin, bridge with Lovenox, [name] is monitoring his INR  He denies double vision, no ptosis, no limb muscle weakness, no dysphagia  UPDATE April 4th 2016: He is on coumadin for PE, INR was monitored by [name], his myasthenia gravis is under good control, is taking prednisone 10 mg every day, he has surgery in Nov 2015 for right leg cellulitis.  He only does little around his house, he has no double vision, SOB due to previous PE,   UPDATE Feb 18 2015: Previous laboratory evaluation showed mild B12 deficiency with level of 204, He did receive IM B12 supplement at his primary care Dr. [name] office for a while, now with normal repeat laboratory evaluation, injection has stopped He has mild choking difficulty, this has been going on since August 2016, not getting worse, he has more trouble with liquid. He complains of shortness of breath when bending over. He denies significant gait difficulty, no breathing difficulty otherwise, he denies double vision, no ptosis,, No chewing difficulties   UPDATE May 22 2015: He came in last visit April 16 2015 complains of worsening blurry vision, double vision, swallowing difficulties, trouble talking, he reported 50% weakness compared to baseline, difficulty getting up from seated position, difficulty chewing, worsening shortness of breath, on examination, he was noted to have increased weakness compared to previous examination, he had moderate eye-closure cheek puff, mild neck flexion weakness, mild bilateral shoulder abduction bilateral hip flexion weakness.  He was treated with IVIG 400 mg/kg every day for 5 days from December 11 to May 02 2015, his vision has much improved, he can walk better, but he continue have  swallowing difficulty, dysarthria, he is also taking prednisone 40 mg every day, increase his Mestinon 60 mg to 3-4 tablets daily  He was not able to tolerate immunosuppression treatment in the past due to skin cancer  UPDATE Jul 07 2015:  I have called his wife, pulmonary function test in June 05 2015.  1, FVC is 3.1 5, 87%, this is normal 2. FEV1 is 2.1 6, 77%, with a percent FEV1 of 68, this indicated mild to moderate primary small  obstruction in this patient was a history of cigarette smoking, large elderly flow rates are generally normal  3. Flow volume loop is unremarkable 4, lung volumes indicate some evidence of air trapping with an increased residual volume, the TLC is normal, the increased residual volume confirms the presence of airflow obstruction  5, DLCO is 19.0, 90%, this is normal 6, elderly resistant 3.7 2, 265%, this is increased  Wife reported, he recently had swallowing study, there was no significant abnormality found, his swallowing, breathing, symptoms overall has improved  He did think that IVIG treatment in Dec 11-16th 2016 has helped his blurry vision, it is gone, he still feel straggled sometimes, he has shortness with exertion and bending over. His talking is much better.  UPDATE May 22nd 2017: He continue complains of shortness of breath with minimum exertion, no diplopia, no double vision, mild dysphasia,  He had a history of  right carpal tunnel release surgery in the past, which has been helpful, now complains of left first 3 finger paresthesia, EMG nerve conduction studies was planned at local hospital  UPDATE July 6th 2017: He stopped taking Mestinon around May 20 third 2017, shortly afterwards, he noticed double vision, he clearly describe eighth of binocular double vision, sometimes vertigo, sometimes horizontal, he went on higher dose of Mestinon 60 mg 4 times a day without improving his symptoms, he slept on a bed with 30 head raise,  with no significant breathing difficulty, he could not eat his dinner on July 5th 2017 due to chewing swallowing difficulty, he could not fly flat sleeping, mildly unsteady gait, increased fatigue,  UPDATE August 17th 2017: Last IVIG treatment was December 05 2015, he did very well for a while, was able to cut grass, trim his lawn, it did helped him for at least 2 weeks, then he had passing out   I reviewed laboratory evaluation in August 2017, normal B12, ferritin level, iron binding capacity was within normal limit, low hemoglobin 10 point 8, creatinine was elevated 1.53, glucose was 125  Previous laboratory evaluation in 2016 showed normal folic acid 12 point 5,low normal B12 level 266, hemoglobin in January 2015 was 14 point 3  He will be evaluated by hematologist/oncologist Dr. Mervin Kung in September 6th 2017, at [facility]  He complains of low back pain since he fell you August 1st 2017, has been taking intermittent Tylenol,  He complains of difficulty swallowing, both liquid, and solid food, no double vision, He is taking mestinon 60mg  4 times a day, which did help him.  Update January 29 2016: I have reviewed his hematologist Dr. Grier Mitts note on September 6th 2017, normocytic microchromic anemia, normal WBC, platelet count, likely from chronic kidney disease, history of fetal deficiency, is on B12 supplement, there was a consideration of low level hemolytic anemia from IVIG infusion, no overt evidence of blood loss  I reviewed laboratory evaluation in September 2017, parietal cell antibody IgG was negative, intrinsic factor negative, mild elevated kappa free light chain, Hg 13.8, creat 1.38, GFR 47, ferritin 163, normal TSh 1.5, elevated vitamin B12 1480  He complains more fatigue, more swallowing difficulty, decreased po intake, he could not drinking water because worry about the choking episode, increased shortness of breath with exertion, intermittent blurry version,  difficulty closing his eyes.  He is now taking mestinon 60mg  qid, no significant side effect, was not sure about the benefit.  Also reviewed swallowing evaluation on June 06 2015 at [facility], there was mild oropharyngeal dysphagia, characterized by spillover to the valleculae and the piriform sinus secondary to reduced oral motor coordination   patient was felt to safe to consume a regular diet with a full range of liquid with  recommendation of upright for food by mouth, and 45 minutes after meals, head flexed slightly forward , swallow at slower rate  UPDATE Oct 16th 2017: I reviewed laboratory in October 2017, immune of fixative electrophoresis showed mild elevated Ig G2.8, otherwise was normal, mild anemia hemoglobin 11.7, mild elevated kappa.free light chain 26, mild elevated creatinine 1.38, normal ferritin 163, normal iron panel, TSH, folic acid, J18 [id number],  Last ivig was in Sept, it has helped him 80%,  he can chew and swallow, he continue complains of blurry vision,  UPDATE May 05 2016: He is overall much improved, but still complains of SOB, is at his baseline, last infusion was on Nov 27, 28th, he  will have more infusion in December, he has mild dysarthria, slight dysphagia will see hematologist Dr. Whitney Muse in January 2018,     Today 10/26/16:  Mr. Hymas is an 83 year old male with a history of myasthenia gravis. He returns today for follow-up. He states IVIG has been working well for him. His last infusion was in December. He denies any ptosis, diplopia or weakness in the extremities. He reports ongoing trouble with breathing. He reports he's had lung function tests in the past. He denies any significant trouble with swallowing. He does notice he has trouble swallowing pills. Denies any trouble chewing his food. He does report some left TMJ pain. He has not taking any over-the-counter medication as he is worried that it will interfere with Coumadin. He  returns today for evaluation.   REVIEW OF SYSTEMS: Out of a complete 14 system review of symptoms, the patient complains only of the following symptoms, and all other reviewed systems are negative.  Hearing loss, ear pain, runny nose, memory loss, weakness  ALLERGIES: Allergies  Allergen Reactions  . Aspirin   . Shellfish Allergy   . Tramadol Nausea Only    HOME MEDICATIONS: Outpatient Medications Prior to Visit  Medication Sig Dispense Refill  . butalbital-acetaminophen-caffeine (FIORICET, ESGIC) 50-325-40 MG tablet Take one tablet daily only as needed for headaches.  #12/30. (Patient not taking: Reported on 10/25/2016) 12 tablet 3  . Cholecalciferol (VITAMIN D3) 1000 units CAPS Take 1,000 Units by mouth daily.    [name] pyridostigmine (MESTINON) 60 MG tablet TAKE 1 TABLET FOUR TIMES DAILY 360 tablet 3  . ranitidine (ZANTAC) 75 MG tablet TAKE 1 TABLET TWICE DAILY 180 tablet 3  . traMADol (ULTRAM) 50 MG tablet Take 1 tablet (50 mg total) by mouth every 6 (six) hours as needed. 30 tablet 3  . valsartan-hydrochlorothiazide (DIOVAN-HCT) 320-25 MG per tablet Take 1 tablet by mouth daily.    [name] warfarin (COUMADIN) 5 MG tablet 5 mg. Taking 5mg  Mon, Wed, Fri.  Taking 2.5mg  Sun, Tues, Thurs, Sat.     No facility-administered medications prior to visit.     PAST MEDICAL HISTORY: Past Medical History:  Diagnosis Date  . B12 deficiency   . Essential tremor   . Hypertension   . Memory deficit   . Myasthenia gravis ([place])    since 2004  . Pulmonary emboli ([place])    in Nov 2003  . Skin cancer     PAST SURGICAL HISTORY: Past Surgical History:  Procedure Laterality Date  . CARPAL TUNNEL RELEASE Right 09/14/2012   Procedure: CARPAL TUNNEL RELEASE;  Surgeon: Cammie Sickle., MD;  Location: [facility];  Service: Orthopedics;  Laterality: Right;  . CARPAL TUNNEL RELEASE Left 11/04/2015   Procedure: LEFT CARPAL TUNNEL RELEASE, EXTENDED INCISION;  Surgeon: Daryll Brod, MD;  Location:  [place];  Service: Orthopedics;  Laterality: Left;  ANESTHESIA: IV REGIONAL UPPER ARM  . CHOLECYSTECTOMY  2012   [place]  . COLONOSCOPY    . FOOT SURGERY  1966   lt orif foot  . SKIN CANCER EXCISION      FAMILY HISTORY: Family History  Problem Relation Age of Onset  . Heart disease Mother   . Heart disease Father     SOCIAL HISTORY: Social History   Social History  . Marital status: Married    Spouse name: [name]  . Number of children: 2  . Years of education: 12   Occupational History  . Plant  Retired   Social History Main Topics  . Smoking status: Former Smoker    Quit date: 05/17/1968  . Smokeless tobacco: Never Used  . Alcohol use No  . Drug use: No  . Sexual activity: Not on file   Other Topics Concern  . Not on file   Social History Narrative   He lives with his wife [name](, has 2 children, retired. Hard of hearing.   Right handed.   Caffeine- None      PHYSICAL EXAM  Vitals:   10/26/16 1308  BP: 116/60  Pulse: 64  Resp: 20  Weight: 220 lb 12.8 oz (100.2 kg)  Height: 5\' 9"  (1.753 m)   Body mass index is 32.61 kg/m.  Generalized: Well developed, in no acute distress   Neurological examination  Mentation: Alert oriented to time, place, history taking. Follows all commands speech and language fluent Cranial nerve II-XII: Pupils were equal round reactive to light. Extraocular movements were full, visual field were full on confrontational test. Facial sensation and strength were normal. Uvula tongue midline. Head turning and shoulder shrug  were normal and symmetric.With superior gaze for 1 minute no diplopia or ptosis noted. Motor: The motor testing reveals 5 over 5 strength of all 4 extremities. Good symmetric motor tone is noted throughout. With arms abducted for 1 minute no weakness noted. Sensory: Sensory testing is intact to soft touch on all 4 extremities. No evidence of extinction is noted.  Coordination: Cerebellar  testing reveals good finger-nose-finger and heel-to-shin bilaterally.  Gait and station: Gait is normal. Tandem gait is normal. Romberg is negative. No drift is seen.  Reflexes: Deep tendon reflexes are symmetric and normal bilaterally.   DIAGNOSTIC DATA (LABS, IMAGING, TESTING) - I reviewed patient records, labs, notes, testing and imaging myself where available.  Lab Results  Component Value Date   WBC 5.6 10/25/2016   HGB 14.4 10/25/2016   HCT 43.5 10/25/2016   MCV 91.6 10/25/2016   PLT 191 10/25/2016      Component Value Date/Time   NA 135 10/25/2016 0951   NA 140 08/19/2014 1304   K 4.7 10/25/2016 0951   CL 98 (L) 10/25/2016 0951   CO2 30 10/25/2016 0951   GLUCOSE 134 (H) 10/25/2016 0951   BUN 16 10/25/2016 0951   BUN 18 08/19/2014 1304   CREATININE 1.50 (H) 10/25/2016 0951   CALCIUM 9.4 10/25/2016 0951   PROT 7.3 10/25/2016 0951   PROT 6.8 08/19/2014 1304   ALBUMIN 4.1 10/25/2016 0951   ALBUMIN 4.2 08/19/2014 1304   AST 26 10/25/2016 0951   ALT 20 10/25/2016 0951   ALKPHOS 55 10/25/2016 0951   BILITOT 0.8 10/25/2016 0951   BILITOT 0.4 08/19/2014 1304   GFRNONAA 42 (L) 10/25/2016 0951   GFRAA 49 (L) 10/25/2016 0951   Lab Results  Component Value Date   VITAMINB12 1,480 (H) 01/21/2016   Lab Results  Component Value Date   TSH 2.010 08/25/2016      ASSESSMENT AND PLAN 83 y.o. year old male  has a past medical history of B12 deficiency; Essential tremor; Hypertension; Memory deficit; Myasthenia gravis ([place]); Pulmonary emboli ([place]); and Skin cancer. here with:  1. Myasthenia gravis  Overall the patient is doing well. He will continue on Mestinon 4 times a day. Advised that if his symptoms worsen or he develops new symptoms he should let us know. Also advised that if he has any increase in trouble breathing he should call 911 or go to the emergency  room. Patient voiced understanding. He will follow-up in 6 months with Dr. Krista Sloan.  I spent 15 minutes with the  patient. 50% of this time was spent reviewing diagnosis and medication.     Ward Givens, MSN, NP-C 10/26/2016, 1:38 PM [facility] [address], [place], [place] [address] [phone]

## 2016-10-26 NOTE — Patient Instructions (Signed)
Continue Mestinon If your symptoms worsen or you develop new symptoms please let us know.

## 2016-10-28 ENCOUNTER — Ambulatory Visit: Payer: Medicare Other | Admitting: Adult Health

## 2016-10-29 NOTE — Progress Notes (Signed)
I have reviewed and agreed above plan. 

## 2016-11-04 DIAGNOSIS — M1712 Unilateral primary osteoarthritis, left knee: Secondary | ICD-10-CM | POA: Diagnosis not present

## 2016-11-04 DIAGNOSIS — M25562 Pain in left knee: Secondary | ICD-10-CM | POA: Diagnosis not present

## 2016-11-19 DIAGNOSIS — I82401 Acute embolism and thrombosis of unspecified deep veins of right lower extremity: Secondary | ICD-10-CM | POA: Diagnosis not present

## 2016-11-19 DIAGNOSIS — I2699 Other pulmonary embolism without acute cor pulmonale: Secondary | ICD-10-CM | POA: Diagnosis not present

## 2016-12-10 ENCOUNTER — Other Ambulatory Visit: Payer: Self-pay

## 2016-12-17 DIAGNOSIS — I2699 Other pulmonary embolism without acute cor pulmonale: Secondary | ICD-10-CM | POA: Diagnosis not present

## 2016-12-17 DIAGNOSIS — I82401 Acute embolism and thrombosis of unspecified deep veins of right lower extremity: Secondary | ICD-10-CM | POA: Diagnosis not present

## 2017-01-19 DIAGNOSIS — Z6832 Body mass index (BMI) 32.0-32.9, adult: Secondary | ICD-10-CM | POA: Diagnosis not present

## 2017-01-19 DIAGNOSIS — J0101 Acute recurrent maxillary sinusitis: Secondary | ICD-10-CM | POA: Diagnosis not present

## 2017-01-19 DIAGNOSIS — R05 Cough: Secondary | ICD-10-CM | POA: Diagnosis not present

## 2017-01-28 DIAGNOSIS — I2699 Other pulmonary embolism without acute cor pulmonale: Secondary | ICD-10-CM | POA: Diagnosis not present

## 2017-03-07 DIAGNOSIS — M25562 Pain in left knee: Secondary | ICD-10-CM | POA: Diagnosis not present

## 2017-03-07 DIAGNOSIS — M1712 Unilateral primary osteoarthritis, left knee: Secondary | ICD-10-CM | POA: Diagnosis not present

## 2017-03-10 DIAGNOSIS — I82401 Acute embolism and thrombosis of unspecified deep veins of right lower extremity: Secondary | ICD-10-CM | POA: Diagnosis not present

## 2017-04-22 DIAGNOSIS — I82401 Acute embolism and thrombosis of unspecified deep veins of right lower extremity: Secondary | ICD-10-CM | POA: Diagnosis not present

## 2017-04-27 ENCOUNTER — Encounter: Payer: Self-pay | Admitting: Neurology

## 2017-04-27 ENCOUNTER — Ambulatory Visit (INDEPENDENT_AMBULATORY_CARE_PROVIDER_SITE_OTHER): Payer: Medicare Other | Admitting: Neurology

## 2017-04-27 VITALS — BP 152/69 | HR 57 | Ht 69.0 in | Wt 213.5 lb

## 2017-04-27 DIAGNOSIS — G7000 Myasthenia gravis without (acute) exacerbation: Secondary | ICD-10-CM | POA: Diagnosis not present

## 2017-04-27 MED ORDER — PYRIDOSTIGMINE BROMIDE 60 MG PO TABS: 60.0000 mg | ORAL_TABLET | Freq: Four times a day (QID) | ORAL | 4 refills | Status: DC

## 2017-04-27 MED ORDER — RANITIDINE HCL 75 MG PO TABS: 75.0000 mg | ORAL_TABLET | Freq: Two times a day (BID) | ORAL | 4 refills | Status: DC

## 2017-04-27 NOTE — Progress Notes (Signed)
PATIENT: Zayden Maffei Zhen DOB: Jan 25, 1934  REASON FOR VISIT: follow up- myasthenia gravis HISTORY FROM: patient  HISTORY OF PRESENT ILLNESS: HISTORY 02/18/15 [name]: [name] is a 83 -year-old right-handed white married male with generalized seropositive myasthenia gravis characterized by bulbar weakness, shortness of breath , and dysphagia. Patient of [name]  He was diagnosed in 10/2002 with a positive Tensilon test, positive acetylchoine receptor antibody,and had a positive ANA at 1-160 speckled pattern. Initially he had right eye ptosis and subsequently, double vision, fatigue with chewing, dysphagia, and upper and lower extremity weakness. He was initially treated with Mestinon bromide in June 2004, which was associated with GI side effects.  Prednisone was started in 03/29/2003 and CellCept 05/2003. He developed increased jitteriness, rash, and cramps on CellCept which was discontinued 10/2003. He had progressive bulbar weakness and was admitted 12/2003 for IV IgG therapy which required a PEG tube for dysphagia transiently. He was started on Imuran. 01/17/2004 he was readmitted for 2 days of IV IgG because of shortness of breath. He has not been hospitalized since 01/2004.   He has been slowly tapered off prednisone 09/2007 and was on Imuran and Mestinon bromide.   He had B12 deficiency and was receiving B12 shots ,but B12 levels off injections have been normal and B12 shots were discontinued.  He has a history of vague visual loss on the left of unknown etiology. He denies swallowing problems, speech problems, voice changes, focal weakness, or double vision.  He is independent in activities of daily living and takes one nap per day.He denies memory loss but his wife states that he does have memory loss. His last blood studies in my office 05/27/10 were normal except a low white blood cell count of 3900.  His dermatologist [name] was concerned that the Imuran is  associated with increased risk of skin cancer and the imuran was discontinued in 12/30/2010. His skin cancer (basal cell cancer) has improved since his imuran was stopped.  He has noted no change In myasthenic symptoms. His skin has improved. His medication for myasthenia is mesinon bromide 60 milligrams one and one half tablets 4 times a day. He has shortness of breath since his pulmonary emboli in 2003, also complains of excessive fatigue.   He is allergic to aspirin, GI side effect, gastric ulcer, and is on Plavix for a history of TIAs with right arm weakness He has swallowing problems at the beginning of the meal with liquids that improves as the meal continues.   He had right CTS release in May 2014 by [name].  He complains of shortness of breath with walking, bending over to tire his shoed, due to PE 2003, pulmnolgist folllowed up, he does not like to chew on tough beef, he denies swallowing difficulty, no double vision, no droopy eyelid, no significant bleeding muscle weakness. He is active in yard, shop, threadmill   UPDATE Jan 16th 2015: He is now tapering off Mestinon, there was no significant change in his functional status, he denies double vision, no ptosis, continue complains of shortness of breath with exertion no significant and muscle weakness, he has to wash down his food sometimes, but no significant swelling, or chewing difficulty, no dysarthria  UPDATE July 16th 2015: He is no longer mestinon, he does complains of increased breathing difficulty from prolonged walking, no chewing difficulty, no double vision, no ptosis.  Laboratory continue to demonstrate positive acetylcholine binding, and modulating antibodies. He did reported that his  skin cancer has improved after stopping imuran, could not tolerate cellcept per record. Previously he responded to IVIG very well.  UPDATE Sept 25th 2015: He was stated on prednisone 10mg  qday, and mestinon 60mg  tid in June  2015, which did help his fatigue, shortness of breath, wife reported 50% improvement, he has increased appetite  But he was admitted to [facility] hospital in Sept 17th for bilateral PEs, right leg DVT, he is started on coumadin, bridge with Lovenox, [name] is monitoring his INR  He denies double vision, no ptosis, no limb muscle weakness, no dysphagia  UPDATE April 4th 2016: He is on coumadin for PE, INR was monitored by [name], his myasthenia gravis is under good control, is taking prednisone 10 mg every day, he has surgery in Nov 2015 for right leg cellulitis.  He only does little around his house, he has no double vision, SOB due to previous PE,   UPDATE Feb 18 2015: Previous laboratory evaluation showed mild B12 deficiency with level of 204, He did receive IM B12 supplement at his primary care Dr. [name] office for a while, now with normal repeat laboratory evaluation, injection has stopped He has mild choking difficulty, this has been going on since August 2016, not getting worse, he has more trouble with liquid. He complains of shortness of breath when bending over. He denies significant gait difficulty, no breathing difficulty otherwise, he denies double vision, no ptosis,, No chewing difficulties   UPDATE May 22 2015: He came in last visit April 16 2015 complains of worsening blurry vision, double vision, swallowing difficulties, trouble talking, he reported 50% weakness compared to baseline, difficulty getting up from seated position, difficulty chewing, worsening shortness of breath, on examination, he was noted to have increased weakness compared to previous examination, he had moderate eye-closure cheek puff, mild neck flexion weakness, mild bilateral shoulder abduction bilateral hip flexion weakness.  He was treated with IVIG 400 mg/kg every day for 5 days from December 11 to May 02 2015, his vision has much improved, he can walk better, but he continue have  swallowing difficulty, dysarthria, he is also taking prednisone 40 mg every day, increase his Mestinon 60 mg to 3-4 tablets daily  He was not able to tolerate immunosuppression treatment in the past due to skin cancer  UPDATE Jul 07 2015:  I have called his wife, pulmonary function test in June 05 2015.  1, FVC is 3.1 5, 87%, this is normal 2. FEV1 is 2.1 6, 77%, with a percent FEV1 of 68, this indicated mild to moderate primary small  obstruction in this patient was a history of cigarette smoking, large elderly flow rates are generally normal  3. Flow volume loop is unremarkable 4, lung volumes indicate some evidence of air trapping with an increased residual volume, the TLC is normal, the increased residual volume confirms the presence of airflow obstruction  5, DLCO is 19.0, 90%, this is normal 6, elderly resistant 3.7 2, 265%, this is increased  Wife reported, he recently had swallowing study, there was no significant abnormality found, his swallowing, breathing, symptoms overall has improved  He did think that IVIG treatment in Dec 11-16th 2016 has helped his blurry vision, it is gone, he still feel straggled sometimes, he has shortness with exertion and bending over. His talking is much better.  UPDATE May 22nd 2017: He continue complains of shortness of breath with minimum exertion, no diplopia, no double vision, mild dysphasia,  He had a history of  right carpal tunnel release surgery in the past, which has been helpful, now complains of left first 3 finger paresthesia, EMG nerve conduction studies was planned at local hospital  UPDATE July 6th 2017: He stopped taking Mestinon around May 20 third 2017, shortly afterwards, he noticed double vision, he clearly describe eighth of binocular double vision, sometimes vertigo, sometimes horizontal, he went on higher dose of Mestinon 60 mg 4 times a day without improving his symptoms, he slept on a bed with 30 head raise,  with no significant breathing difficulty, he could not eat his dinner on July 5th 2017 due to chewing swallowing difficulty, he could not fly flat sleeping, mildly unsteady gait, increased fatigue,  UPDATE August 17th 2017: Last IVIG treatment was December 05 2015, he did very well for a while, was able to cut grass, trim his lawn, it did helped him for at least 2 weeks, then he had passing out   I reviewed laboratory evaluation in August 2017, normal B12, ferritin level, iron binding capacity was within normal limit, low hemoglobin 10 point 8, creatinine was elevated 1.53, glucose was 125  Previous laboratory evaluation in 2016 showed normal folic acid 12 point 5,low normal B12 level 266, hemoglobin in January 2015 was 14 point 3  He will be evaluated by hematologist/oncologist Dr. Mervin Kung in September 6th 2017, at [facility]  He complains of low back pain since he fell you August 1st 2017, has been taking intermittent Tylenol,  He complains of difficulty swallowing, both liquid, and solid food, no double vision, He is taking mestinon 60mg  4 times a day, which did help him.  Update January 29 2016: I have reviewed his hematologist Dr. Grier Mitts note on September 6th 2017, normocytic microchromic anemia, normal WBC, platelet count, likely from chronic kidney disease, history of fetal deficiency, is on B12 supplement, there was a consideration of low level hemolytic anemia from IVIG infusion, no overt evidence of blood loss  I reviewed laboratory evaluation in September 2017, parietal cell antibody IgG was negative, intrinsic factor negative, mild elevated kappa free light chain, Hg 13.8, creat 1.38, GFR 47, ferritin 163, normal TSh 1.5, elevated vitamin B12 1480  He complains more fatigue, more swallowing difficulty, decreased po intake, he could not drinking water because worry about the choking episode, increased shortness of breath with exertion, intermittent blurry version,  difficulty closing his eyes.  He is now taking mestinon 60mg  qid, no significant side effect, was not sure about the benefit.  Also reviewed swallowing evaluation on June 06 2015 at [facility], there was mild oropharyngeal dysphagia, characterized by spillover to the valleculae and the piriform sinus secondary to reduced oral motor coordination   patient was felt to safe to consume a regular diet with a full range of liquid with  recommendation of upright for food by mouth, and 45 minutes after meals, head flexed slightly forward , swallow at slower rate  UPDATE Oct 16th 2017: I reviewed laboratory in October 2017, immune of fixative electrophoresis showed mild elevated Ig G2.8, otherwise was normal, mild anemia hemoglobin 11.7, mild elevated kappa.free light chain 26, mild elevated creatinine 1.38, normal ferritin 163, normal iron panel, TSH, folic acid, [id number],  Last ivig was in Sept, it has helped him 80%,  he can chew and swallow, he continue complains of blurry vision,  UPDATE May 05 2016: He is overall much improved, but still complains of SOB, is at his baseline, last infusion was on Nov 27, 28th, he  will have more infusion in December, he has mild dysarthria, slight dysphagia will see hematologist Dr. Whitney Muse in January 2018,     Today 10/26/16:  Mr. Straughter is an 83 year old male with a history of myasthenia gravis. He returns today for follow-up. He states IVIG has been working well for him. His last infusion was in December. He denies any ptosis, diplopia or weakness in the extremities. He reports ongoing trouble with breathing. He reports he's had lung function tests in the past. He denies any significant trouble with swallowing. He does notice he has trouble swallowing pills. Denies any trouble chewing his food. He does report some left TMJ pain. He has not taking any over-the-counter medication as he is worried that it will interfere with Coumadin. He  returns today for evaluation.   REVIEW OF SYSTEMS: Out of a complete 14 system review of symptoms, the patient complains only of the following symptoms, and all other reviewed systems are negative.  Hearing loss, ear pain, runny nose, memory loss, weakness  ALLERGIES: Allergies  Allergen Reactions  . Aspirin   . Shellfish Allergy   . Tramadol Nausea Only    HOME MEDICATIONS: Outpatient Medications Prior to Visit  Medication Sig Dispense Refill  . Cholecalciferol (VITAMIN D3) 1000 units CAPS Take 1,000 Units by mouth daily.    [name] pyridostigmine (MESTINON) 60 MG tablet TAKE 1 TABLET FOUR TIMES DAILY 360 tablet 3  . ranitidine (ZANTAC) 75 MG tablet TAKE 1 TABLET TWICE DAILY 180 tablet 3  . valsartan-hydrochlorothiazide (DIOVAN-HCT) 320-25 MG per tablet Take 1 tablet by mouth daily.    [name] warfarin (COUMADIN) 5 MG tablet 5 mg. Taking 5mg  Mon, Wed, Fri.  Taking 2.5mg  Sun, Tues, Thurs, Sat.    . butalbital-acetaminophen-caffeine (FIORICET, ESGIC) 50-325-40 MG tablet Take one tablet daily only as needed for headaches.  #12/30. (Patient not taking: Reported on 10/25/2016) 12 tablet 3   No facility-administered medications prior to visit.     PAST MEDICAL HISTORY: Past Medical History:  Diagnosis Date  . B12 deficiency   . Essential tremor   . Hypertension   . Memory deficit   . Myasthenia gravis ([place])    since 2004  . Pulmonary emboli ([place])    in Nov 2003  . Skin cancer     PAST SURGICAL HISTORY: Past Surgical History:  Procedure Laterality Date  . CARPAL TUNNEL RELEASE Right 09/14/2012   Procedure: CARPAL TUNNEL RELEASE;  Surgeon: Cammie Sickle., MD;  Location: [facility];  Service: Orthopedics;  Laterality: Right;  . CARPAL TUNNEL RELEASE Left 11/04/2015   Procedure: LEFT CARPAL TUNNEL RELEASE, EXTENDED INCISION;  Surgeon: Daryll Brod, MD;  Location: [facility];  Service: Orthopedics;  Laterality: Left;  ANESTHESIA: IV REGIONAL UPPER ARM  .  CHOLECYSTECTOMY  2012   [place]  . COLONOSCOPY    . FOOT SURGERY  1966   lt orif foot  . SKIN CANCER EXCISION      FAMILY HISTORY: Family History  Problem Relation Age of Onset  . Heart disease Mother   . Heart disease Father     SOCIAL HISTORY: Social History   Socioeconomic History  . Marital status: Married    Spouse name: [name]  . Number of children: 2  . Years of education: 58  . Highest education level: Not on file  Social Needs  . Financial resource strain: Not on file  . Food insecurity - worry: Not on file  . Food insecurity - inability: Not  on file  . Transportation needs - medical: Not on file  . Transportation needs - non-medical: Not on file  Occupational History  . Occupation: Plant    Comment: Retired  Tobacco Use  . Smoking status: Former Smoker    Last attempt to quit: 05/17/1968    Years since quitting: 48.9  . Smokeless tobacco: Never Used  Substance and Sexual Activity  . Alcohol use: No  . Drug use: No  . Sexual activity: Not on file  Other Topics Concern  . Not on file  Social History Narrative   He lives with his wife [name](, has 2 children, retired. Hard of hearing.   Right handed.   Caffeine- None      PHYSICAL EXAM  Vitals:   04/27/17 1125  BP: (!) 152/69  Pulse: (!) 57  Weight: 213 lb 8 oz (96.8 kg)  Height: 5\' 9"  (1.753 m)   Body mass index is 31.53 kg/m.  Generalized: Well developed, in no acute distress   Neurological examination  Mentation: Alert oriented to time, place, history taking. Follows all commands speech and language fluent Cranial nerve II-XII: Pupils were equal round reactive to light. Extraocular movements were full, visual field were full on confrontational test. Facial sensation and strength were normal. Uvula tongue midline. Head turning and shoulder shrug  were normal and symmetric.With superior gaze for 1 minute no diplopia or ptosis noted. Motor: The motor testing reveals 5 over 5 strength of all 4  extremities. Good symmetric motor tone is noted throughout. With arms abducted for 1 minute no weakness noted. Sensory: Sensory testing is intact to soft touch on all 4 extremities. No evidence of extinction is noted.  Coordination: Cerebellar testing reveals good finger-nose-finger and heel-to-shin bilaterally.  Gait and station: Gait is normal. Tandem gait is normal. Romberg is negative. No drift is seen.  Reflexes: Deep tendon reflexes are symmetric and normal bilaterally.   DIAGNOSTIC DATA (LABS, IMAGING, TESTING) - I reviewed patient records, labs, notes, testing and imaging myself where available.  Lab Results  Component Value Date   WBC 5.6 10/25/2016   HGB 14.4 10/25/2016   HCT 43.5 10/25/2016   MCV 91.6 10/25/2016   PLT 191 10/25/2016      Component Value Date/Time   NA 135 10/25/2016 0951   NA 140 08/19/2014 1304   K 4.7 10/25/2016 0951   CL 98 (L) 10/25/2016 0951   CO2 30 10/25/2016 0951   GLUCOSE 134 (H) 10/25/2016 0951   BUN 16 10/25/2016 0951   BUN 18 08/19/2014 1304   CREATININE 1.50 (H) 10/25/2016 0951   CALCIUM 9.4 10/25/2016 0951   PROT 7.3 10/25/2016 0951   PROT 6.8 08/19/2014 1304   ALBUMIN 4.1 10/25/2016 0951   ALBUMIN 4.2 08/19/2014 1304   AST 26 10/25/2016 0951   ALT 20 10/25/2016 0951   ALKPHOS 55 10/25/2016 0951   BILITOT 0.8 10/25/2016 0951   BILITOT 0.4 08/19/2014 1304   GFRNONAA 42 (L) 10/25/2016 0951   GFRAA 49 (L) 10/25/2016 0951   Lab Results  Component Value Date   VITAMINB12 1,480 (H) 01/21/2016   Lab Results  Component Value Date   TSH 2.010 08/25/2016      ASSESSMENT AND PLAN 83 y.o. year old male  has a past medical history of B12 deficiency, Essential tremor, Hypertension, Memory deficit, Myasthenia gravis ([place]), Pulmonary emboli ([place]), and Skin cancer. here with:  1. Myasthenia gravis  Overall the patient is doing well. He will continue on Mestinon 4  times a day. Advised that if his symptoms worsen or he develops new  symptoms he should let us know. Also advised that if he has any increase in trouble breathing he should call 911 or go to the emergency room. Patient voiced understanding. He will follow-up in 6 months with Dr. [name].  I spent 15 minutes with the patient. 50% of this time was spent reviewing diagnosis and medication.     Ward Givens, MSN, NP-C 04/27/2017, 11:35 AM [facility] [address] [place], [place] [address] [phone]    PATIENT: Jamarea Selner Pindell DOB: May 03, 1934  REASON FOR VISIT: follow up- myasthenia gravis HISTORY FROM: patient  HISTORY OF PRESENT ILLNESS: HISTORY 02/18/15 [name]: BOBBYJOE PABST is a 4 -year-old right-handed white married male with generalized seropositive myasthenia gravis characterized by bulbar weakness, shortness of breath , and dysphagia. Patient of [name]  He was diagnosed in 10/2002 with a positive Tensilon test, positive acetylchoine receptor antibody,and had a positive ANA at 1-160 speckled pattern. Initially he had right eye ptosis and subsequently, double vision, fatigue with chewing, dysphagia, and upper and lower extremity weakness. He was initially treated with Mestinon bromide in June 2004, which was associated with GI side effects.  Prednisone was started in 03/29/2003 and CellCept 05/2003. He developed increased jitteriness, rash, and cramps on CellCept which was discontinued 10/2003. He had progressive bulbar weakness and was admitted 12/2003 for IV IgG therapy which required a PEG tube for dysphagia transiently. He was started on Imuran. 01/17/2004 he was readmitted for 2 days of IV IgG because of shortness of breath. He has not been hospitalized since 01/2004.   He has been slowly tapered off prednisone 09/2007 and was on Imuran and Mestinon bromide.   He had B12 deficiency and was receiving B12 shots ,but B12 levels off injections have been normal and B12 shots were discontinued.  He has a history of vague  visual loss on the left of unknown etiology. He denies swallowing problems, speech problems, voice changes, focal weakness, or double vision.  He is independent in activities of daily living and takes one nap per day.He denies memory loss but his wife states that he does have memory loss. His last blood studies in my office 05/27/10 were normal except a low white blood cell count of 3900.  His dermatologist [name] was concerned that the Imuran is associated with increased risk of skin cancer and the imuran was discontinued in 12/30/2010. His skin cancer (basal cell cancer) has improved since his imuran was stopped.  He has noted no change In myasthenic symptoms. His skin has improved. His medication for myasthenia is mesinon bromide 60 milligrams one and one half tablets 4 times a day. He has shortness of breath since his pulmonary emboli in 2003, also complains of excessive fatigue.   He is allergic to aspirin, GI side effect, gastric ulcer, and is on Plavix for a history of TIAs with right arm weakness He has swallowing problems at the beginning of the meal with liquids that improves as the meal continues.   He had right CTS release in May 2014 by [name].  He complains of shortness of breath with walking, bending over to tire his shoed, due to PE 2003, pulmnolgist folllowed up, he does not like to chew on tough beef, he denies swallowing difficulty, no double vision, no droopy eyelid, no significant bleeding muscle weakness. He is active in yard, shop, threadmill   UPDATE Jan 16th  2015: He is now tapering off Mestinon, there was no significant change in his functional status, he denies double vision, no ptosis, continue complains of shortness of breath with exertion no significant and muscle weakness, he has to wash down his food sometimes, but no significant swelling, or chewing difficulty, no dysarthria  UPDATE July 16th 2015: He is no longer mestinon, he does  complains of increased breathing difficulty from prolonged walking, no chewing difficulty, no double vision, no ptosis.  Laboratory continue to demonstrate positive acetylcholine binding, and modulating antibodies. He did reported that his skin cancer has improved after stopping imuran, could not tolerate cellcept per record. Previously he responded to IVIG very well.  UPDATE Sept 25th 2015: He was stated on prednisone 10mg  qday, and mestinon 60mg  tid in June 2015, which did help his fatigue, shortness of breath, wife reported 50% improvement, he has increased appetite  But he was admitted to [facility] hospital in Sept 17th for bilateral PEs, right leg DVT, he is started on coumadin, bridge with Lovenox, [name] is monitoring his INR  He denies double vision, no ptosis, no limb muscle weakness, no dysphagia  UPDATE April 4th 2016: He is on coumadin for PE, INR was monitored by [name], his myasthenia gravis is under good control, is taking prednisone 10 mg every day, he has surgery in Nov 2015 for right leg cellulitis.  He only does little around his house, he has no double vision, SOB due to previous PE,   UPDATE Feb 18 2015: Previous laboratory evaluation showed mild B12 deficiency with level of 204, He did receive IM B12 supplement at his primary care Dr. [name] office for a while, now with normal repeat laboratory evaluation, injection has stopped He has mild choking difficulty, this has been going on since August 2016, not getting worse, he has more trouble with liquid. He complains of shortness of breath when bending over. He denies significant gait difficulty, no breathing difficulty otherwise, he denies double vision, no ptosis,, No chewing difficulties   UPDATE May 22 2015: He came in last visit April 16 2015 complains of worsening blurry vision, double vision, swallowing difficulties, trouble talking, he reported 50% weakness compared to baseline, difficulty  getting up from seated position, difficulty chewing, worsening shortness of breath, on examination, he was noted to have increased weakness compared to previous examination, he had moderate eye-closure cheek puff, mild neck flexion weakness, mild bilateral shoulder abduction bilateral hip flexion weakness.  He was treated with IVIG 400 mg/kg every day for 5 days from December 11 to May 02 2015, his vision has much improved, he can walk better, but he continue have swallowing difficulty, dysarthria, he is also taking prednisone 40 mg every day, increase his Mestinon 60 mg to 3-4 tablets daily  He was not able to tolerate immunosuppression treatment in the past due to skin cancer  UPDATE Jul 07 2015:  I have called his wife, pulmonary function test in June 05 2015.  1, FVC is 3.1 5, 87%, this is normal 2. FEV1 is 2.1 6, 77%, with a percent FEV1 of 68, this indicated mild to moderate primary small  obstruction in this patient was a history of cigarette smoking, large elderly flow rates are generally normal  3. Flow volume loop is unremarkable 4, lung volumes indicate some evidence of air trapping with an increased residual volume, the TLC is normal, the increased residual volume confirms the presence of airflow obstruction  5, DLCO is 19.0, 90%, this is  normal 6, elderly resistant 3.7 2, 265%, this is increased  Wife reported, he recently had swallowing study, there was no significant abnormality found, his swallowing, breathing, symptoms overall has improved  He did think that IVIG treatment in Dec 11-16th 2016 has helped his blurry vision, it is gone, he still feel straggled sometimes, he has shortness with exertion and bending over. His talking is much better.  UPDATE May 22nd 2017: He continue complains of shortness of breath with minimum exertion, no diplopia, no double vision, mild dysphasia,  He had a history of right carpal tunnel release surgery in the past, which  has been helpful, now complains of left first 3 finger paresthesia, EMG nerve conduction studies was planned at local hospital  UPDATE July 6th 2017: He stopped taking Mestinon around May 20 third 2017, shortly afterwards, he noticed double vision, he clearly describe eighth of binocular double vision, sometimes vertigo, sometimes horizontal, he went on higher dose of Mestinon 60 mg 4 times a day without improving his symptoms, he slept on a bed with 30 head raise, with no significant breathing difficulty, he could not eat his dinner on July 5th 2017 due to chewing swallowing difficulty, he could not fly flat sleeping, mildly unsteady gait, increased fatigue,  UPDATE August 17th 2017: Last IVIG treatment was December 05 2015, he did very well for a while, was able to cut grass, trim his lawn, it did helped him for at least 2 weeks, then he had passing out   I reviewed laboratory evaluation in August 2017, normal B12, ferritin level, iron binding capacity was within normal limit, low hemoglobin 10 point 8, creatinine was elevated 1.53, glucose was 125  Previous laboratory evaluation in 2016 showed normal folic acid 12 point 5,low normal B12 level 266, hemoglobin in January 2015 was 14 point 3  He will be evaluated by hematologist/oncologist Dr. Mervin Kung in September 6th 2017, at [facility]  He complains of low back pain since he fell you August 1st 2017, has been taking intermittent Tylenol,  He complains of difficulty swallowing, both liquid, and solid food, no double vision, He is taking mestinon 60mg  4 times a day, which did help him.  Update January 29 2016: I have reviewed his hematologist Dr. Grier Mitts note on September 6th 2017, normocytic microchromic anemia, normal WBC, platelet count, likely from chronic kidney disease, history of fetal deficiency, is on B12 supplement, there was a consideration of low level hemolytic anemia from IVIG infusion, no overt evidence of blood  loss  I reviewed laboratory evaluation in September 2017, parietal cell antibody IgG was negative, intrinsic factor negative, mild elevated kappa free light chain, Hg 13.8, creat 1.38, GFR 47, ferritin 163, normal TSh 1.5, elevated vitamin B12 1480  He complains more fatigue, more swallowing difficulty, decreased po intake, he could not drinking water because worry about the choking episode, increased shortness of breath with exertion, intermittent blurry version, difficulty closing his eyes.  He is now taking mestinon 60mg  qid, no significant side effect, was not sure about the benefit.  Also reviewed swallowing evaluation on June 06 2015 at [facility], there was mild oropharyngeal dysphagia, characterized by spillover to the valleculae and the piriform sinus secondary to reduced oral motor coordination   patient was felt to safe to consume a regular diet with a full range of liquid with  recommendation of upright for food by mouth, and 45 minutes after meals, head flexed slightly forward , swallow at slower rate  UPDATE  Oct 16th 2017: I reviewed laboratory in October 2017, immune of fixative electrophoresis showed mild elevated Ig G2.8, otherwise was normal, mild anemia hemoglobin 11.7, mild elevated kappa.free light chain 26, mild elevated creatinine 1.38, normal ferritin 163, normal iron panel, TSH, folic acid, Z66 0630,  Last ivig was in Sept, it has helped him 80%,  he can chew and swallow, he continue complains of blurry vision,  UPDATE May 05 2016: He is overall much improved, but still complains of SOB, is at his baseline, last infusion was on Nov 27, 28th, he will have more infusion in December, he has mild dysarthria, slight dysphagia will see hematologist Dr. Whitney Muse in January 2018,   UPDATE Apr 27 2017: He is overall doing very well, can shove snow baseline, mild SOB, no double vision, no significant dysphagia, no chewing difficulty, last ivig was in Dec  2017.  He is taking Mestinon 60 mg 4 times a day   REVIEW OF SYSTEMS: Out of a complete 14 system review of symptoms, the patient complains only of the following symptoms, and all other reviewed systems are negative.  Hearing loss,  ALLERGIES: Allergies  Allergen Reactions  . Aspirin   . Shellfish Allergy   . Tramadol Nausea Only    HOME MEDICATIONS: Outpatient Medications Prior to Visit  Medication Sig Dispense Refill  . Cholecalciferol (VITAMIN D3) 1000 units CAPS Take 1,000 Units by mouth daily.    [name] pyridostigmine (MESTINON) 60 MG tablet TAKE 1 TABLET FOUR TIMES DAILY 360 tablet 3  . ranitidine (ZANTAC) 75 MG tablet TAKE 1 TABLET TWICE DAILY 180 tablet 3  . valsartan-hydrochlorothiazide (DIOVAN-HCT) 320-25 MG per tablet Take 1 tablet by mouth daily.    [name] warfarin (COUMADIN) 5 MG tablet 5 mg. Taking 5mg  Mon, Wed, Fri.  Taking 2.5mg  Sun, Tues, Thurs, Sat.    . butalbital-acetaminophen-caffeine (FIORICET, ESGIC) 50-325-40 MG tablet Take one tablet daily only as needed for headaches.  #12/30. (Patient not taking: Reported on 10/25/2016) 12 tablet 3   No facility-administered medications prior to visit.     PAST MEDICAL HISTORY: Past Medical History:  Diagnosis Date  . B12 deficiency   . Essential tremor   . Hypertension   . Memory deficit   . Myasthenia gravis ([place])    since 2004  . Pulmonary emboli ([place])    in Nov 2003  . Skin cancer     PAST SURGICAL HISTORY: Past Surgical History:  Procedure Laterality Date  . CARPAL TUNNEL RELEASE Right 09/14/2012   Procedure: CARPAL TUNNEL RELEASE;  Surgeon: Cammie Sickle., MD;  Location: [place];  Service: Orthopedics;  Laterality: Right;  . CARPAL TUNNEL RELEASE Left 11/04/2015   Procedure: LEFT CARPAL TUNNEL RELEASE, EXTENDED INCISION;  Surgeon: Daryll Brod, MD;  Location: [place];  Service: Orthopedics;  Laterality: Left;  ANESTHESIA: IV REGIONAL UPPER ARM  . CHOLECYSTECTOMY  2012    [place]  . COLONOSCOPY    . FOOT SURGERY  1966   lt orif foot  . SKIN CANCER EXCISION      FAMILY HISTORY: Family History  Problem Relation Age of Onset  . Heart disease Mother   . Heart disease Father     SOCIAL HISTORY: Social History   Socioeconomic History  . Marital status: Married    Spouse name: [name]  . Number of children: 2  . Years of education: 51  . Highest education level: Not on file  Social Needs  . Financial resource strain: Not  on file  . Food insecurity - worry: Not on file  . Food insecurity - inability: Not on file  . Transportation needs - medical: Not on file  . Transportation needs - non-medical: Not on file  Occupational History  . Occupation: Plant    Comment: Retired  Tobacco Use  . Smoking status: Former Smoker    Last attempt to quit: 05/17/1968    Years since quitting: 48.9  . Smokeless tobacco: Never Used  Substance and Sexual Activity  . Alcohol use: No  . Drug use: No  . Sexual activity: Not on file  Other Topics Concern  . Not on file  Social History Narrative   He lives with his wife [name](, has 2 children, retired. Hard of hearing.   Right handed.   Caffeine- None      PHYSICAL EXAM  Vitals:   04/27/17 1125  BP: (!) 152/69  Pulse: (!) 57  Weight: 213 lb 8 oz (96.8 kg)  Height: 5\' 9"  (1.753 m)   Body mass index is 31.53 kg/m.  PHYSICAL EXAMNIATION:  Gen: NAD, conversant, well nourised, obese, well groomed                     Cardiovascular: Regular rate rhythm, no peripheral edema, warm, nontender. Eyes: Conjunctivae clear without exudates or hemorrhage Neck: Supple, no carotid bruits. Pulmonary: Clear to auscultation bilaterally   NEUROLOGICAL EXAM:  MENTAL STATUS: Speech:    Speech is normal; fluent and spontaneous with normal comprehension.  Cognition:     Orientation to time, place and person     Normal recent and remote memory     Normal Attention span and concentration     Normal Language, naming,  repeating,spontaneous speech     Fund of knowledge   CRANIAL NERVES: CN II: Visual fields are full to confrontation. Fundoscopic exam is normal with sharp discs and no vascular changes. Pupils are round equal and briskly reactive to light. CN III, IV, VI: extraocular movement are normal. No ptosis. CN V: Facial sensation is intact to pinprick in all 3 divisions bilaterally. Corneal responses are intact.  CN VII: He has mild eye closure and cheek puff weakness. CN VIII: Decreased hearing, wearing hearing aids bilaterally CN IX, X: Palate elevates symmetrically. Phonation is normal. CN XI: Head turning and shoulder shrug are intact CN XII: Tongue is midline with normal movements and no atrophy.  MOTOR: There is no pronator drift of out-stretched arms. Muscle bulk and tone are normal. Muscle strength is normal.  REFLEXES: Reflexes are hypoactive and symmetric at the biceps, triceps, knees, and ankles. Plantar responses are flexor  SENSORY: Intact to light touch,  COORDINATION: Rapid alternating movements and fine finger movements are intact. There is no dysmetria on finger-to-nose and heel-knee-shin.    GAIT/STANCE: He can get up from seated position arm across, steady gait Romberg is absent.   DIAGNOSTIC DATA (LABS, IMAGING, TESTING) - I reviewed patient records, labs, notes, testing and imaging myself where available.  Lab Results  Component Value Date   WBC 5.6 10/25/2016   HGB 14.4 10/25/2016   HCT 43.5 10/25/2016   MCV 91.6 10/25/2016   PLT 191 10/25/2016      Component Value Date/Time   NA 135 10/25/2016 0951   NA 140 08/19/2014 1304   K 4.7 10/25/2016 0951   CL 98 (L) 10/25/2016 0951   CO2 30 10/25/2016 0951   GLUCOSE 134 (H) 10/25/2016 0951   BUN 16 10/25/2016 0951  BUN 18 08/19/2014 1304   CREATININE 1.50 (H) 10/25/2016 0951   CALCIUM 9.4 10/25/2016 0951   PROT 7.3 10/25/2016 0951   PROT 6.8 08/19/2014 1304   ALBUMIN 4.1 10/25/2016 0951   ALBUMIN 4.2  08/19/2014 1304   AST 26 10/25/2016 0951   ALT 20 10/25/2016 0951   ALKPHOS 55 10/25/2016 0951   BILITOT 0.8 10/25/2016 0951   BILITOT 0.4 08/19/2014 1304   GFRNONAA 42 (L) 10/25/2016 0951   GFRAA 49 (L) 10/25/2016 0951   Lab Results  Component Value Date   VITAMINB12 1,480 (H) 01/21/2016   Lab Results  Component Value Date   TSH 2.010 08/25/2016      ASSESSMENT AND PLAN 83 y.o. year old   Seropositive myasthenia gravis  Overall stable, only mild bilateral facial weakness,  Continue Mestinon 60 mg 3-4 times each day  Not a good candidate for long-term immunosuppressive treatment due to recurrent large area of skin cancer,  Responded very well to previous IVIG treatment, loading dose was in September 2017, then every 4 weeks 1 g/kg, last infusion was in May 12 2016.  Baseline shortness of breath  Due to previous PE, on chronic Coumadin treatment, previous pulmonary functional tests showed evidence of small airway constriction  Marcial Pacas, M.D. Ph.D.  [facility] [place], [place] [address] Phone: [phone] Fax:      [phone]

## 2017-05-24 DIAGNOSIS — I82401 Acute embolism and thrombosis of unspecified deep veins of right lower extremity: Secondary | ICD-10-CM | POA: Diagnosis not present

## 2017-05-24 DIAGNOSIS — I2699 Other pulmonary embolism without acute cor pulmonale: Secondary | ICD-10-CM | POA: Diagnosis not present

## 2017-07-18 DIAGNOSIS — M25562 Pain in left knee: Secondary | ICD-10-CM | POA: Diagnosis not present

## 2017-07-18 DIAGNOSIS — M1712 Unilateral primary osteoarthritis, left knee: Secondary | ICD-10-CM | POA: Diagnosis not present

## 2017-07-18 DIAGNOSIS — G8929 Other chronic pain: Secondary | ICD-10-CM | POA: Diagnosis not present

## 2017-07-22 DIAGNOSIS — I2699 Other pulmonary embolism without acute cor pulmonale: Secondary | ICD-10-CM | POA: Diagnosis not present

## 2017-08-22 DIAGNOSIS — D485 Neoplasm of uncertain behavior of skin: Secondary | ICD-10-CM | POA: Diagnosis not present

## 2017-08-23 DIAGNOSIS — C44329 Squamous cell carcinoma of skin of other parts of face: Secondary | ICD-10-CM | POA: Diagnosis not present

## 2017-09-01 DIAGNOSIS — I82401 Acute embolism and thrombosis of unspecified deep veins of right lower extremity: Secondary | ICD-10-CM | POA: Diagnosis not present

## 2017-10-12 DIAGNOSIS — I2699 Other pulmonary embolism without acute cor pulmonale: Secondary | ICD-10-CM | POA: Diagnosis not present

## 2017-10-13 DIAGNOSIS — C44329 Squamous cell carcinoma of skin of other parts of face: Secondary | ICD-10-CM | POA: Diagnosis not present

## 2017-10-25 NOTE — Progress Notes (Signed)
[facility]  PATIENT: Calvin Sloan DOB: [birth year]   REASON FOR VISIT: follow up MG HISTORY FROM:patient    HISTORY OF PRESENT ILLNESS: HISTORY 02/18/15 Calvin Sloan): Calvin Sloan is a 84 -year-old right-handed white married male with generalized seropositive myasthenia gravis characterized by bulbar weakness, shortness of breath , and dysphagia. Patient of Dr. Erling Sloan  He was diagnosed in 10/2002 with a positive Tensilon test, positive acetylchoine receptor antibody,and had a positive ANA at 1-160 speckled pattern. Initially he had right eye ptosis and subsequently, double vision, fatigue with chewing, dysphagia, and upper and lower extremity weakness. He was initially treated with Mestinon bromide in June 2004, which was associated with GI side effects.  Prednisone was started in 03/29/2003 and CellCept 05/2003. He developed increased jitteriness, rash, and cramps on CellCept which was discontinued 10/2003. He had progressive bulbar weakness and was admitted 12/2003 for IV IgG therapy which required a PEG tube for dysphagia transiently. He was started on Imuran. 01/17/2004 he was readmitted for 2 days of IV IgG because of shortness of breath. He has not been hospitalized since 01/2004.   He has been slowly tapered off prednisone 09/2007 and was on Imuran and Mestinon bromide.   He had B12 deficiency and was receiving B12 shots ,but B12 levels off injections have been normal and B12 shots were discontinued.  He has a history of vague visual loss on the left of unknown etiology. He denies swallowing problems, speech problems, voice changes, focal weakness, or double vision.  He is independent in activities of daily living and takes one nap per day.He denies memory loss but his wife states that he does have memory loss. His last blood studies in my office 05/27/10 were normal except a low white blood cell count of 3900.  His dermatologist Dr. Janann Sloan was concerned  that the Imuran is associated with increased risk of skin cancer and the imuran was discontinued in 12/30/2010. His skin cancer (basal cell cancer) has improved since his imuran was stopped.  He has noted no change In myasthenic symptoms. His skin has improved. His medication for myasthenia is mesinon bromide 60 milligrams one and one half tablets 4 times a day. He has shortness of breath since his pulmonary emboli in 2003, also complains of excessive fatigue.   He is allergic to aspirin, GI side effect, gastric ulcer, and is on Plavix for a history of TIAs with right arm weakness He has swallowing problems at the beginning of the meal with liquids that improves as the meal continues.   He had right CTS release in May 2014 by Dr. Daylene Sloan.  He complains of shortness of breath with walking, bending over to tire his shoed, due to PE 2003, pulmnolgist folllowed up, he does not like to chew on tough beef, he denies swallowing difficulty, no double vision, no droopy eyelid, no significant bleeding muscle weakness. He is active in yard, shop, threadmill   UPDATE Jan 16th 2015: He is now tapering off Mestinon, there was no significant change in his functional status, he denies double vision, no ptosis, continue complains of shortness of breath with exertion no significant and muscle weakness, he has to wash down his food sometimes, but no significant swelling, or chewing difficulty, no dysarthria  UPDATE July 16th 2015: He is no longer mestinon, he does complains of increased breathing difficulty from prolonged walking, no chewing difficulty, no double vision, no ptosis.  Laboratory continue to demonstrate positive acetylcholine binding, and modulating antibodies. He  did reported that his skin cancer has improved after stopping imuran, could not tolerate cellcept per record. Previously he responded to IVIG very well.  UPDATE Sept 25th 2015: He was stated on prednisone 10mg  qday, and mestinon  60mg  tid in June 2015, which did help his fatigue, shortness of breath, wife reported 50% improvement, he has increased appetite  But he was admitted to [facility] hospital in Sept 17th for bilateral PEs, right leg DVT, he is started on coumadin, bridge with Lovenox, Dr. Nadara Sloan is monitoring his INR  He denies double vision, no ptosis, no limb muscle weakness, no dysphagia  UPDATE April 4th 2016: He is on coumadin for PE, INR was monitored by Dr. Nadara Sloan, his myasthenia gravis is under good control, is taking prednisone 10 mg every day, he has surgery in Nov 2015 for right leg cellulitis.  He only does little around his house, he has no double vision, SOB due to previous PE,   UPDATE Feb 18 2015: Previous laboratory evaluation showed mild B12 deficiency with level of 204, He did receive IM B12 supplement at his primary care Dr. Lyman Sloan office for a while, now with normal repeat laboratory evaluation, injection has stopped He has mild choking difficulty, this has been going on since Sloan 2016, not getting worse, he has more trouble with liquid. He complains of shortness of breath when bending over. He denies significant gait difficulty, no breathing difficulty otherwise, he denies double vision, no ptosis,, No chewing difficulties   UPDATE May 22 2015: He came in last visit April 16 2015 complains of worsening blurry vision, double vision, swallowing difficulties, trouble talking, he reported 50% weakness compared to baseline, difficulty getting up from seated position, difficulty chewing, worsening shortness of breath, on examination, he was noted to have increased weakness compared to previous examination, he had moderate eye-closure cheek puff, mild neck flexion weakness, mild bilateral shoulder abduction bilateral hip flexion weakness.  He was treated with IVIG 400 mg/kg every day for 5 days from December 11 to May 02 2015, his vision has much improved, he can walk better, but he  continue have swallowing difficulty, dysarthria, he is also taking prednisone 40 mg every day, increase his Mestinon 60 mg to 3-4 tablets daily  He was not able to tolerate immunosuppression treatment in the past due to skin cancer  UPDATE Jul 07 2015:  I have called his wife, pulmonary function test in June 05 2015.  1, FVC is 3.1 5, 87%, this is normal 2. FEV1 is 2.1 6, 77%, with a percent FEV1 of 68, this indicated mild to moderate primary small obstruction in this patient was a history of cigarette smoking, large elderly flow rates are generally normal  3. Flow volume loop is unremarkable 4, lung volumes indicate some evidence of air trapping with an increased residual volume, the TLC is normal, the increased residual volume confirms the presence of airflow obstruction  5, DLCO is 19.0, 90%, this is normal 6, elderly resistant 3.7 2, 265%, this is increased  Wife reported, he recently had swallowing study, there was no significant abnormality found, his swallowing, breathing, symptoms overall has improved  He did think that IVIG treatment in Dec 11-16th 2016 has helped his blurry vision, it is gone, he still feel straggled sometimes, he has shortness with exertion and bending over. His talking is much better.  UPDATE May 22nd 2017: He continue complains of shortness of breath with minimum exertion, no diplopia, no double vision, mild dysphasia,  He had  a history of right carpal tunnel release surgery in the past, which has been helpful, now complains of left first 3 finger paresthesia, EMG nerve conduction studies was planned at local hospital  UPDATE July 6th 2017: He stopped taking Mestinon around May 20 third 2017, shortly afterwards, he noticed double vision, he clearly describe eighth of binocular double vision, sometimes vertigo, sometimes horizontal, he went on higher dose of Mestinon 60 mg 4 times a day without improving his symptoms, he slept on a bed with 30  head raise, with no significant breathing difficulty, he could not eat his dinner on July 5th 2017 due to chewing swallowing difficulty, he could not fly flat sleeping, mildly unsteady gait, increased fatigue,  UPDATE Sloan 17th 2017: Last IVIG treatment was December 05 2015, he did very well for a while, was able to cut grass, trim his lawn, it did helped him for at least 2 weeks, then he had passing out   I reviewed laboratory evaluation in Sloan 2017, normal B12, ferritin level, iron binding capacity was within normal limit, low hemoglobin 10 point 8, creatinine was elevated 1.53, glucose was 125  Previous laboratory evaluation in 2016 showed normal folic acid 12 point 5,low normal B12 level 266, hemoglobin in January 2015 was 14 point 3  He will be evaluated by hematologist/oncologist Dr. Mervin Kung in September 6th 2017, at [facility]  He complains of low back pain since he fell you Sloan 1st 2017, has been taking intermittent Tylenol,  He complains of difficulty swallowing, both liquid, and solid food, no double vision, He is taking mestinon 60mg  4 times a day, which did help him.  Update January 29 2016: I have reviewed his hematologist Dr. Grier Mitts note on September 6th 2017, normocytic microchromic anemia, normal WBC, platelet count, likely from chronic kidney disease, history of fetal deficiency, is on B12 supplement, there was a consideration of low level hemolytic anemia from IVIG infusion, no overt evidence of blood loss  I reviewed laboratory evaluation in September 2017, parietal cell antibody IgG was negative, intrinsic factor negative, mild elevated kappa free light chain, Hg 13.8, creat 1.38, GFR 47, ferritin 163, normal TSh 1.5, elevated vitamin B12 1480  He complains more fatigue, more swallowing difficulty, decreased po intake, he could not drinking water because worry about the choking episode, increased shortness of breath with exertion, intermittent blurry  version, difficulty closing his eyes.  He is now taking mestinon 60mg  qid, no significant side effect, was not sure about the benefit.  Also reviewed swallowing evaluation on June 06 2015 at [facility], there was mild oropharyngeal dysphagia, characterized by spillover to the valleculae and the piriform sinus secondary to reduced oral motor coordination  patient was felt to safe to consume a regular diet with a full range of liquid with recommendation of upright for food by mouth, and 45 minutes after meals, head flexed slightly forward , swallow at slower rate  UPDATE Oct 16th 2017: I reviewed laboratory in October 2017, immune of fixative electrophoresis showed mild elevated Ig G2.8, otherwise was normal, mild anemia hemoglobin 11.7, mild elevated kappa.free light chain 26, mild elevated creatinine 1.38, normal ferritin 163, normal iron panel, TSH, folic acid, H06 2376,  Last ivig was in Sept, it has helped him 80%, he can chew and swallow, he continue complains of blurry vision,  UPDATE May 05 2016: He is overall much improved, but still complains of SOB, is at his baseline, last infusion was on Nov 27, 28th, he  will have more infusion in December, he has mild dysarthria, slight dysphagia will see hematologist Dr. Vivianne Spence January 2018,     Today 10/26/16: MM Calvin Sloan is an 84 year old male with a history of myasthenia gravis. He returns today for follow-up. He states IVIG has been working well for him. His last infusion was in December. He denies any ptosis, diplopia or weakness in the extremities. He reports ongoing trouble with breathing. He reports he's had lung function tests in the past. He denies any significant trouble with swallowing. He does notice he has trouble swallowing pills. Denies any trouble chewing his food. He does report some left TMJ pain. He has not taking any over-the-counter medication as he is worried that it will interfere with  Coumadin. He returns today for evaluation. UPDATE 6/12/2019CM Calvin Sloan, 84 year old male returns for follow-up with history of myasthenia gravis.  His disease has been stable.  He denies any diplopia ptosis weakness of the extremities.  He does report some shortness of  breath with exertion.  No difficulty swallowing   He says he needs to have a knee replacement.  He returns for reevaluation.  He is tolerating Mestinon without side effects.  He had recent squamous cell removed from his forehead.  REVIEW OF SYSTEMS: Full 14 system review of systems performed and notable only for those listed, all others are neg:  Constitutional: neg  Cardiovascular: neg Ear/Nose/Throat: Hard of hearing Skin: neg Eyes: neg Respiratory: Shortness of breath Gastroitestinal: neg  Hematology/Lymphatic: neg  Endocrine: neg Musculoskeletal: Knee pain Allergy/Immunology: neg Neurological: neg Psychiatric: neg Sleep : neg   ALLERGIES: Allergies  Allergen Reactions  . Aspirin   . Shellfish Allergy   . Tramadol Nausea Only    HOME MEDICATIONS: Outpatient Medications Prior to Visit  Medication Sig Dispense Refill  . pyridostigmine (MESTINON) 60 MG tablet Take 1 tablet (60 mg total) by mouth 4 (four) times daily. 360 tablet 4  . ranitidine (ZANTAC) 75 MG tablet Take 1 tablet (75 mg total) by mouth 2 (two) times daily. 180 tablet 4  . valsartan-hydrochlorothiazide (DIOVAN-HCT) 320-25 MG per tablet Take 1 tablet by mouth daily.    . vitamin B-12 (CYANOCOBALAMIN) 1000 MCG tablet Take 1,000 mcg by mouth daily.     [name] warfarin (COUMADIN) 5 MG tablet 5 mg. Taking 5mg  Mon, Wed, Fri.  Taking 2.5mg  Sun, Tues, Thurs, Sat.    . Cholecalciferol (VITAMIN D3) 1000 units CAPS Take 1,000 Units by mouth daily.     No facility-administered medications prior to visit.     PAST MEDICAL HISTORY: Past Medical History:  Diagnosis Date  . B12 deficiency   . Essential tremor   . Hypertension   . Memory deficit   .  Myasthenia gravis ([place])    since 2004  . Pulmonary emboli ([place])    in Nov 2003  . Skin cancer     PAST SURGICAL HISTORY: Past Surgical History:  Procedure Laterality Date  . CARPAL TUNNEL RELEASE Right 09/14/2012   Procedure: CARPAL TUNNEL RELEASE;  Surgeon: Cammie Sickle., MD;  Location: [place];  Service: Orthopedics;  Laterality: Right;  . CARPAL TUNNEL RELEASE Left 11/04/2015   Procedure: LEFT CARPAL TUNNEL RELEASE, EXTENDED INCISION;  Surgeon: Daryll Brod, MD;  Location: [place];  Service: Orthopedics;  Laterality: Left;  ANESTHESIA: IV REGIONAL UPPER ARM  . CHOLECYSTECTOMY  2012   [place]  . COLONOSCOPY    . FOOT SURGERY  1966   lt orif foot  .  SKIN CANCER EXCISION      FAMILY HISTORY: Family History  Problem Relation Age of Onset  . Heart disease Mother   . Heart disease Father     SOCIAL HISTORY: Social History   Socioeconomic History  . Marital status: Married    Spouse name: [name]  . Number of children: 2  . Years of education: 11  . Highest education level: Not on file  Occupational History  . Occupation: Plant    Comment: Retired  Scientific laboratory technician  . Financial resource strain: Not on file  . Food insecurity:    Worry: Not on file    Inability: Not on file  . Transportation needs:    Medical: Not on file    Non-medical: Not on file  Tobacco Use  . Smoking status: Former Smoker    Last attempt to quit: 05/17/1968    Years since quitting: 49.4  . Smokeless tobacco: Never Used  Substance and Sexual Activity  . Alcohol use: No  . Drug use: No  . Sexual activity: Not on file  Lifestyle  . Physical activity:    Days per week: Not on file    Minutes per session: Not on file  . Stress: Not on file  Relationships  . Social connections:    Talks on phone: Not on file    Gets together: Not on file    Attends religious service: Not on file    Active member of club or organization: Not on file    Attends meetings of  clubs or organizations: Not on file    Relationship status: Not on file  . Intimate partner violence:    Fear of current or ex partner: Not on file    Emotionally abused: Not on file    Physically abused: Not on file    Forced sexual activity: Not on file  Other Topics Concern  . Not on file  Social History Narrative   He lives with his wife [name](, has 2 children, retired. Hard of hearing.   Right handed.   Caffeine- None     PHYSICAL EXAM  Vitals:   10/26/17 1246  BP: 118/66  Pulse: 60  SpO2: 95%  Weight: 216 lb 12.8 oz (98.3 kg)  Height: 5\' 9"  (1.753 m)   Body mass index is 32.02 kg/m.  Generalized: Well developed, in no acute distress  Head: normocephalic and atraumatic,. Oropharynx benign bandage to right forehead   Neurological examination   Mentation: Alert oriented to time, place, history taking. Attention span and concentration appropriate. Recent and remote memory intact.  Follows all commands speech and language fluent.   Cranial nerve II-XII: Pupils were equal round reactive to light extraocular movements were full, visual field were full on confrontational test. Facial sensation and strength were normal. hearing was intact to finger rubbing bilaterally. Uvula tongue midline. head turning and shoulder shrug were normal and symmetric.Tongue protrusion into cheek strength was normal.With superior gaze for 1 minute no diplopia or ptosis noted. Motor: normal bulk and tone, full strength in the BUE, BLE,With arms abducted for 1 minute no weakness noted. Sensory: normal and symmetric to light touch,  Coordination: finger-nose-finger, heel-to-shin bilaterally, no dysmetria Reflexes: Symmetric upper and lower, plantar responses were flexor bilaterally. Gait and Station: Rising up from seated position without assistance, normal stance,  moderate stride, good arm swing, smooth turning, able to perform tiptoe, and heel walking without difficulty. Tandem gait is  steady  DIAGNOSTIC DATA (LABS, IMAGING, TESTING) - I reviewed  patient records, labs, notes, testing and imaging myself where available.  Lab Results  Component Value Date   WBC 5.6 10/25/2016   HGB 14.4 10/25/2016   HCT 43.5 10/25/2016   MCV 91.6 10/25/2016   PLT 191 10/25/2016      Component Value Date/Time   NA 135 10/25/2016 0951   NA 140 08/19/2014 1304   K 4.7 10/25/2016 0951   CL 98 (L) 10/25/2016 0951   CO2 30 10/25/2016 0951   GLUCOSE 134 (H) 10/25/2016 0951   BUN 16 10/25/2016 0951   BUN 18 08/19/2014 1304   CREATININE 1.50 (H) 10/25/2016 0951   CALCIUM 9.4 10/25/2016 0951   PROT 7.3 10/25/2016 0951   PROT 6.8 08/19/2014 1304   ALBUMIN 4.1 10/25/2016 0951   ALBUMIN 4.2 08/19/2014 1304   AST 26 10/25/2016 0951   ALT 20 10/25/2016 0951   ALKPHOS 55 10/25/2016 0951   BILITOT 0.8 10/25/2016 0951   BILITOT 0.4 08/19/2014 1304   GFRNONAA 42 (L) 10/25/2016 0951   GFRAA 49 (L) 10/25/2016 0951    Lab Results  Component Value Date   VITAMINB12 1,480 (H) 01/21/2016   Lab Results  Component Value Date   TSH 2.010 08/25/2016      ASSESSMENT AND PLAN   84 y.o. year old male  has a past medical history of B12 deficiency, Essential tremor, Hypertension, Memory deficit, Myasthenia gravis ([place]), Pulmonary emboli ([place]), and Skin cancer. here to follow-up for  1. Myasthenia gravis   PLAN: Continue Mestinon 4 times a day does not need refills For any difficulty breathing call 911 or go to ER  Call for any worsening symptoms follow-up in 6 months will get labs at that time  Dennie Bible, [facility], [facility], [facility] [address] [place], [place] [address] [phone]

## 2017-10-26 ENCOUNTER — Ambulatory Visit (INDEPENDENT_AMBULATORY_CARE_PROVIDER_SITE_OTHER): Payer: Medicare Other | Admitting: Nurse Practitioner

## 2017-10-26 ENCOUNTER — Encounter: Payer: Self-pay | Admitting: Nurse Practitioner

## 2017-10-26 VITALS — BP 118/66 | HR 60 | Ht 69.0 in | Wt 216.8 lb

## 2017-10-26 DIAGNOSIS — G7000 Myasthenia gravis without (acute) exacerbation: Secondary | ICD-10-CM | POA: Diagnosis not present

## 2017-10-26 NOTE — Patient Instructions (Signed)
Continue Mestinon 4 times a day For any difficulty breathing call 911 or go to ER  follow-up in 6 months

## 2017-10-27 DIAGNOSIS — D485 Neoplasm of uncertain behavior of skin: Secondary | ICD-10-CM | POA: Diagnosis not present

## 2017-10-27 DIAGNOSIS — Z08 Encounter for follow-up examination after completed treatment for malignant neoplasm: Secondary | ICD-10-CM | POA: Diagnosis not present

## 2017-10-27 DIAGNOSIS — C44329 Squamous cell carcinoma of skin of other parts of face: Secondary | ICD-10-CM | POA: Diagnosis not present

## 2017-10-28 NOTE — Progress Notes (Signed)
I have reviewed and agreed above plan. 

## 2017-11-02 DIAGNOSIS — M25562 Pain in left knee: Secondary | ICD-10-CM | POA: Diagnosis not present

## 2017-11-02 DIAGNOSIS — M1712 Unilateral primary osteoarthritis, left knee: Secondary | ICD-10-CM | POA: Diagnosis not present

## 2017-11-07 DIAGNOSIS — D485 Neoplasm of uncertain behavior of skin: Secondary | ICD-10-CM | POA: Diagnosis not present

## 2017-11-07 DIAGNOSIS — C44329 Squamous cell carcinoma of skin of other parts of face: Secondary | ICD-10-CM | POA: Diagnosis not present

## 2017-11-09 DIAGNOSIS — C44329 Squamous cell carcinoma of skin of other parts of face: Secondary | ICD-10-CM | POA: Diagnosis not present

## 2017-11-23 DIAGNOSIS — I2699 Other pulmonary embolism without acute cor pulmonale: Secondary | ICD-10-CM | POA: Diagnosis not present

## 2017-12-03 DIAGNOSIS — R1032 Left lower quadrant pain: Secondary | ICD-10-CM | POA: Diagnosis not present

## 2017-12-03 DIAGNOSIS — Z87891 Personal history of nicotine dependence: Secondary | ICD-10-CM | POA: Diagnosis not present

## 2017-12-03 DIAGNOSIS — Z7901 Long term (current) use of anticoagulants: Secondary | ICD-10-CM | POA: Diagnosis not present

## 2017-12-03 DIAGNOSIS — Z86711 Personal history of pulmonary embolism: Secondary | ICD-10-CM | POA: Diagnosis not present

## 2017-12-03 DIAGNOSIS — R109 Unspecified abdominal pain: Secondary | ICD-10-CM | POA: Diagnosis not present

## 2017-12-03 DIAGNOSIS — I10 Essential (primary) hypertension: Secondary | ICD-10-CM | POA: Diagnosis not present

## 2017-12-03 DIAGNOSIS — M1990 Unspecified osteoarthritis, unspecified site: Secondary | ICD-10-CM | POA: Diagnosis not present

## 2017-12-03 DIAGNOSIS — S39011A Strain of muscle, fascia and tendon of abdomen, initial encounter: Secondary | ICD-10-CM | POA: Diagnosis not present

## 2017-12-03 DIAGNOSIS — G7000 Myasthenia gravis without (acute) exacerbation: Secondary | ICD-10-CM | POA: Diagnosis not present

## 2017-12-03 DIAGNOSIS — Z79899 Other long term (current) drug therapy: Secondary | ICD-10-CM | POA: Diagnosis not present

## 2017-12-03 DIAGNOSIS — Z7952 Long term (current) use of systemic steroids: Secondary | ICD-10-CM | POA: Diagnosis not present

## 2017-12-03 DIAGNOSIS — X501XXA Overexertion from prolonged static or awkward postures, initial encounter: Secondary | ICD-10-CM | POA: Diagnosis not present

## 2017-12-06 DIAGNOSIS — C44329 Squamous cell carcinoma of skin of other parts of face: Secondary | ICD-10-CM | POA: Diagnosis not present

## 2017-12-10 DIAGNOSIS — R1032 Left lower quadrant pain: Secondary | ICD-10-CM | POA: Diagnosis not present

## 2017-12-10 DIAGNOSIS — Z6833 Body mass index (BMI) 33.0-33.9, adult: Secondary | ICD-10-CM | POA: Diagnosis not present

## 2017-12-10 DIAGNOSIS — E782 Mixed hyperlipidemia: Secondary | ICD-10-CM | POA: Diagnosis not present

## 2017-12-10 DIAGNOSIS — Z0000 Encounter for general adult medical examination without abnormal findings: Secondary | ICD-10-CM | POA: Diagnosis not present

## 2017-12-14 DIAGNOSIS — K5730 Diverticulosis of large intestine without perforation or abscess without bleeding: Secondary | ICD-10-CM | POA: Diagnosis not present

## 2017-12-14 DIAGNOSIS — N200 Calculus of kidney: Secondary | ICD-10-CM | POA: Diagnosis not present

## 2018-01-04 DIAGNOSIS — Z86711 Personal history of pulmonary embolism: Secondary | ICD-10-CM | POA: Diagnosis not present

## 2018-01-10 DIAGNOSIS — L570 Actinic keratosis: Secondary | ICD-10-CM | POA: Diagnosis not present

## 2018-01-10 DIAGNOSIS — L929 Granulomatous disorder of the skin and subcutaneous tissue, unspecified: Secondary | ICD-10-CM | POA: Diagnosis not present

## 2018-01-11 DIAGNOSIS — R1032 Left lower quadrant pain: Secondary | ICD-10-CM | POA: Diagnosis not present

## 2018-01-11 DIAGNOSIS — Z6833 Body mass index (BMI) 33.0-33.9, adult: Secondary | ICD-10-CM | POA: Diagnosis not present

## 2018-01-31 DIAGNOSIS — M1732 Unilateral post-traumatic osteoarthritis, left knee: Secondary | ICD-10-CM | POA: Diagnosis not present

## 2018-01-31 DIAGNOSIS — M1712 Unilateral primary osteoarthritis, left knee: Secondary | ICD-10-CM | POA: Diagnosis not present

## 2018-02-17 DIAGNOSIS — I82401 Acute embolism and thrombosis of unspecified deep veins of right lower extremity: Secondary | ICD-10-CM | POA: Diagnosis not present

## 2018-03-31 DIAGNOSIS — Z86711 Personal history of pulmonary embolism: Secondary | ICD-10-CM | POA: Diagnosis not present

## 2018-04-19 ENCOUNTER — Telehealth: Payer: Self-pay | Admitting: Nurse Practitioner

## 2018-04-19 NOTE — Telephone Encounter (Signed)
Pts wife [name] (no DPR on file) called stating medication ranitidine (ZANTAC) 75 MG tablet has "been pulled off the shelf" requesting a different MG dosing be faxed in for the pt. Please advise.

## 2018-04-19 NOTE — Telephone Encounter (Signed)
Spoke to pts wife and relayed that famotidine (pepcid) is option.  Can get OTC.  Check with pharmacist at [facility], regarding dosing (zantac 75 po bid to pepcid?).  She said she would.

## 2018-04-19 NOTE — Telephone Encounter (Signed)
Spoke to pts wife.  He is taking for his diagnosis MG and been on for years.   Zantac/ Ranitidine as been pulled from shelves.  Need replacment.  (famotidine is option).  Please advise.

## 2018-04-19 NOTE — Telephone Encounter (Signed)
Famotidine is also known as Pepcid. Can obtain OTC

## 2018-05-02 DIAGNOSIS — M1732 Unilateral post-traumatic osteoarthritis, left knee: Secondary | ICD-10-CM | POA: Diagnosis not present

## 2018-05-02 DIAGNOSIS — M25562 Pain in left knee: Secondary | ICD-10-CM | POA: Diagnosis not present

## 2018-05-12 DIAGNOSIS — I82401 Acute embolism and thrombosis of unspecified deep veins of right lower extremity: Secondary | ICD-10-CM | POA: Diagnosis not present

## 2018-05-23 NOTE — Progress Notes (Signed)
[facility]  PATIENT: Calvin Sloan DOB: 12-03-1933   REASON FOR VISIT: follow up [place] and wife    HISTORY OF PRESENT ILLNESS: HISTORY 02/18/15 Calvin Sloan): Calvin Sloan is a 85 -year-old right-handed white married male with generalized seropositive myasthenia gravis characterized by bulbar weakness, shortness of breath , and dysphagia. Patient of Dr. Erling Sloan  He was diagnosed in 10/2002 with a positive Tensilon test, positive acetylchoine receptor antibody,and had a positive ANA at 1-160 speckled pattern. Initially he had right eye ptosis and subsequently, double vision, fatigue with chewing, dysphagia, and upper and lower extremity weakness. He was initially treated with Mestinon bromide in June 2004, which was associated with GI side effects.  Prednisone was started in 03/29/2003 and CellCept 05/2003. He developed increased jitteriness, rash, and cramps on CellCept which was discontinued 10/2003. He had progressive bulbar weakness and was admitted 12/2003 for IV IgG therapy which required a PEG tube for dysphagia transiently. He was started on Imuran. 01/17/2004 he was readmitted for 2 days of IV IgG because of shortness of breath. He has not been hospitalized since 01/2004.   He has been slowly tapered off prednisone 09/2007 and was on Imuran and Mestinon bromide.   He had B12 deficiency and was receiving B12 shots ,but B12 levels off injections have been normal and B12 shots were discontinued.  He has a history of vague visual loss on the left of unknown etiology. He denies swallowing problems, speech problems, voice changes, focal weakness, or double vision.  He is independent in activities of daily living and takes one nap per day.He denies memory loss but his wife states that he does have memory loss. His last blood studies in my office 05/27/10 were normal except a low white blood cell count of 3900.  His dermatologist Dr. Janann Sloan was  concerned that the Imuran is associated with increased risk of skin cancer and the imuran was discontinued in 12/30/2010. His skin cancer (basal cell cancer) has improved since his imuran was stopped.  He has noted no change In myasthenic symptoms. His skin has improved. His medication for myasthenia is mesinon bromide 60 milligrams one and one half tablets 4 times a day. He has shortness of breath since his pulmonary emboli in 2003, also complains of excessive fatigue.   He is allergic to aspirin, GI side effect, gastric ulcer, and is on Plavix for a history of TIAs with right arm weakness He has swallowing problems at the beginning of the meal with liquids that improves as the meal continues.   He had right CTS release in May 2014 by Dr. Daylene Sloan.  He complains of shortness of breath with walking, bending over to tire his shoed, due to PE 2003, pulmnolgist folllowed up, he does not like to chew on tough beef, he denies swallowing difficulty, no double vision, no droopy eyelid, no significant bleeding muscle weakness. He is active in yard, shop, threadmill   UPDATE Jan 16th 2015: He is now tapering off Mestinon, there was no significant change in his functional status, he denies double vision, no ptosis, continue complains of shortness of breath with exertion no significant and muscle weakness, he has to wash down his food sometimes, but no significant swelling, or chewing difficulty, no dysarthria  UPDATE July 16th 2015: He is no longer mestinon, he does complains of increased breathing difficulty from prolonged walking, no chewing difficulty, no double vision, no ptosis.  Laboratory continue to demonstrate positive acetylcholine binding, and modulating  antibodies. He did reported that his skin cancer has improved after stopping imuran, could not tolerate cellcept per record. Previously he responded to IVIG very well.  UPDATE Sept 25th 2015: He was stated on prednisone 10mg  qday, and  mestinon 60mg  tid in June 2015, which did help his fatigue, shortness of breath, wife reported 50% improvement, he has increased appetite  But he was admitted to [facility] in Sept 17th for bilateral PEs, right leg DVT, he is started on coumadin, bridge with Lovenox, Dr. Nadara Sloan is monitoring his INR  He denies double vision, no ptosis, no limb muscle weakness, no dysphagia  UPDATE April 4th 2016: He is on coumadin for PE, INR was monitored by Dr. Nadara Sloan, his myasthenia gravis is under good control, is taking prednisone 10 mg every day, he has surgery in Nov 2015 for right leg cellulitis.  He only does little around his house, he has no double vision, SOB due to previous PE,   UPDATE Feb 18 2015: Previous laboratory evaluation showed mild B12 deficiency with level of 204, He did receive IM B12 supplement at his primary care Dr. Lyman Sloan office for a while, now with normal repeat laboratory evaluation, injection has stopped He has mild choking difficulty, this has been going on since Sloan 2016, not getting worse, he has more trouble with liquid. He complains of shortness of breath when bending over. He denies significant gait difficulty, no breathing difficulty otherwise, he denies double vision, no ptosis,, No chewing difficulties   UPDATE May 22 2015: He came in last visit April 16 2015 complains of worsening blurry vision, double vision, swallowing difficulties, trouble talking, he reported 50% weakness compared to baseline, difficulty getting up from seated position, difficulty chewing, worsening shortness of breath, on examination, he was noted to have increased weakness compared to previous examination, he had moderate eye-closure cheek puff, mild neck flexion weakness, mild bilateral shoulder abduction bilateral hip flexion weakness.  He was treated with IVIG 400 mg/kg every day for 5 days from December 11 to May 02 2015, his vision has much improved, he can walk  better, but he continue have swallowing difficulty, dysarthria, he is also taking prednisone 40 mg every day, increase his Mestinon 60 mg to 3-4 tablets daily  He was not able to tolerate immunosuppression treatment in the past due to skin cancer  UPDATE Jul 07 2015:  I have called his wife, pulmonary function test in June 05 2015.  1, FVC is 3.1 5, 87%, this is normal 2. FEV1 is 2.1 6, 77%, with a percent FEV1 of 68, this indicated mild to moderate primary small obstruction in this patient was a history of cigarette smoking, large elderly flow rates are generally normal  3. Flow volume loop is unremarkable 4, lung volumes indicate some evidence of air trapping with an increased residual volume, the TLC is normal, the increased residual volume confirms the presence of airflow obstruction  5, DLCO is 19.0, 90%, this is normal 6, elderly resistant 3.7 2, 265%, this is increased  Wife reported, he recently had swallowing study, there was no significant abnormality found, his swallowing, breathing, symptoms overall has improved  He did think that IVIG treatment in Dec 11-16th 2016 has helped his blurry vision, it is gone, he still feel straggled sometimes, he has shortness with exertion and bending over. His talking is much better.  UPDATE May 22nd 2017: He continue complains of shortness of breath with minimum exertion, no diplopia, no double vision, mild dysphasia,  He had a history of right carpal tunnel release surgery in the past, which has been helpful, now complains of left first 3 finger paresthesia, EMG nerve conduction studies was planned at local hospital  UPDATE July 6th 2017: He stopped taking Mestinon around May 20 third 2017, shortly afterwards, he noticed double vision, he clearly describe eighth of binocular double vision, sometimes vertigo, sometimes horizontal, he went on higher dose of Mestinon 60 mg 4 times a day without improving his symptoms, he slept on a  bed with 30 head raise, with no significant breathing difficulty, he could not eat his dinner on July 5th 2017 due to chewing swallowing difficulty, he could not fly flat sleeping, mildly unsteady gait, increased fatigue,  UPDATE Sloan 17th 2017: Last IVIG treatment was December 05 2015, he did very well for a while, was able to cut grass, trim his lawn, it did helped him for at least 2 weeks, then he had passing out   I reviewed laboratory evaluation in Sloan 2017, normal B12, ferritin level, iron binding capacity was within normal limit, low hemoglobin 10 point 8, creatinine was elevated 1.53, glucose was 125  Previous laboratory evaluation in 2016 showed normal folic acid 12 point 5,low normal B12 level 266, hemoglobin in January 2015 was 14 point 3  He will be evaluated by hematologist/oncologist Dr. Mervin Kung in September 6th 2017, at [facility]  He complains of low back pain since he fell you Sloan 1st 2017, has been taking intermittent Tylenol,  He complains of difficulty swallowing, both liquid, and solid food, no double vision, He is taking mestinon 60mg  4 times a day, which did help him.  Update January 29 2016: I have reviewed his hematologist Dr. Grier Mitts note on September 6th 2017, normocytic microchromic anemia, normal WBC, platelet count, likely from chronic kidney disease, history of fetal deficiency, is on B12 supplement, there was a consideration of low level hemolytic anemia from IVIG infusion, no overt evidence of blood loss  I reviewed laboratory evaluation in September 2017, parietal cell antibody IgG was negative, intrinsic factor negative, mild elevated kappa free light chain, Hg 13.8, creat 1.38, GFR 47, ferritin 163, normal TSh 1.5, elevated vitamin B12 1480  He complains more fatigue, more swallowing difficulty, decreased po intake, he could not drinking water because worry about the choking episode, increased shortness of breath with exertion,  intermittent blurry version, difficulty closing his eyes.  He is now taking mestinon 60mg  qid, no significant side effect, was not sure about the benefit.  Also reviewed swallowing evaluation on June 06 2015 at [facility], there was mild oropharyngeal dysphagia, characterized by spillover to the valleculae and the piriform sinus secondary to reduced oral motor coordination  patient was felt to safe to consume a regular diet with a full range of liquid with recommendation of upright for food by mouth, and 45 minutes after meals, head flexed slightly forward , swallow at slower rate  UPDATE Oct 16th 2017: I reviewed laboratory in October 2017, immune of fixative electrophoresis showed mild elevated Ig G2.8, otherwise was normal, mild anemia hemoglobin 11.7, mild elevated kappa.free light chain 26, mild elevated creatinine 1.38, normal ferritin 163, normal iron panel, TSH, folic acid, [id number],  Last ivig was in Sept, it has helped him 80%, he can chew and swallow, he continue complains of blurry vision,  UPDATE May 05 2016: He is overall much improved, but still complains of SOB, is at his baseline, last infusion was on Nov 27,  28th, he will have more infusion in December, he has mild dysarthria, slight dysphagia will see hematologist Dr. Vivianne Spence January 2018,     Today 10/26/16: Calvin Sloan is an 85 year old male with a history of myasthenia gravis. He returns today for follow-up. He states IVIG has been working well for him. His last infusion was in December. He denies any ptosis, diplopia or weakness in the extremities. He reports ongoing trouble with breathing. He reports he's had lung function tests in the past. He denies any significant trouble with swallowing. He does notice he has trouble swallowing pills. Denies any trouble chewing his food. He does report some left TMJ pain. He has not taking any over-the-counter medication as he is worried that it will  interfere with Coumadin. He returns today for evaluation. UPDATE 6/12/2019CM Calvin Sloan, 85 year old male returns for follow-up with history of myasthenia gravis.  His disease has been stable.  He denies any diplopia ptosis weakness of the extremities.  He does report some shortness of  breath with exertion.  No difficulty swallowing   He says he needs to have a knee replacement.  He returns for reevaluation.  He is tolerating Mestinon without side effects.  He had recent squamous cell removed from his forehead. UPDATE 1/8/2020CM Calvin Sloan, 85 year old male returns for follow-up with history of myasthenia gravis.  His disease has been stable.  He denies any weakness of the extremities ptosis or diplopia.  No falls.  He does get short of breath with exertion.  He denies any difficulty swallowing.  He has been getting injections to his left knee.  He needs a knee replacement but is holding off.  He is currently on Mestinon without side effects and good control of his symptoms.  He returns for reevaluation REVIEW OF SYSTEMS: Full 14 system review of systems performed and notable only for those listed, all others are neg:  Constitutional: neg  Cardiovascular: neg Ear/Nose/Throat: Hard of hearing Skin: neg Eyes: neg Respiratory: Shortness of breath Gastroitestinal: neg  Hematology/Lymphatic: neg  Endocrine: neg Musculoskeletal: Knee pain Allergy/Immunology: neg Neurological: neg Psychiatric: neg Sleep : neg   ALLERGIES: Allergies  Allergen Reactions  . Aspirin   . Shellfish Allergy   . Tramadol Nausea Only    HOME MEDICATIONS: Outpatient Medications Prior to Visit  Medication Sig Dispense Refill  . pyridostigmine (MESTINON) 60 MG tablet Take 1 tablet (60 mg total) by mouth 4 (four) times daily. 360 tablet 4  . valsartan-hydrochlorothiazide (DIOVAN-HCT) 320-25 MG per tablet Take 1 tablet by mouth daily.    . vitamin B-12 (CYANOCOBALAMIN) 1000 MCG tablet Take 1,000 mcg by mouth daily.      [name] warfarin (COUMADIN) 5 MG tablet 5 mg. Taking 5mg  Mon, Wed, Fri.  Taking 2.5mg  Sun, Tues, Thurs, Sat.    . famotidine (PEPCID) 10 MG tablet Take 10 mg by mouth 2 (two) times daily.     No facility-administered medications prior to visit.     PAST MEDICAL HISTORY: Past Medical History:  Diagnosis Date  . B12 deficiency   . Essential tremor   . Hypertension   . Memory deficit   . Myasthenia gravis ([place])    since 2004  . Pulmonary emboli ([place])    in Nov 2003  . Skin cancer     PAST SURGICAL HISTORY: Past Surgical History:  Procedure Laterality Date  . CARPAL TUNNEL RELEASE Right 09/14/2012   Procedure: CARPAL TUNNEL RELEASE;  Surgeon: Cammie Sickle., MD;  Location: [facility];  Service: Orthopedics;  Laterality: Right;  . CARPAL TUNNEL RELEASE Left 11/04/2015   Procedure: LEFT CARPAL TUNNEL RELEASE, EXTENDED INCISION;  Surgeon: Daryll Brod, MD;  Location: [facility];  Service: Orthopedics;  Laterality: Left;  ANESTHESIA: IV REGIONAL UPPER ARM  . CHOLECYSTECTOMY  2012   [place]  . COLONOSCOPY    . FOOT SURGERY  1966   lt orif foot  . SKIN CANCER EXCISION      FAMILY HISTORY: Family History  Problem Relation Age of Onset  . Heart disease Mother   . Heart disease Father     SOCIAL HISTORY: Social History   Socioeconomic History  . Marital status: Married    Spouse name: [name]  . Number of children: 2  . Years of education: 36  . Highest education level: Not on file  Occupational History  . Occupation: Plant    Comment: Retired  Scientific laboratory technician  . Financial resource strain: Not on file  . Food insecurity:    Worry: Not on file    Inability: Not on file  . Transportation needs:    Medical: Not on file    Non-medical: Not on file  Tobacco Use  . Smoking status: Former Smoker    Last attempt to quit: 05/17/1968    Years since quitting: 50.0  . Smokeless tobacco: Never Used  Substance and Sexual Activity  . Alcohol use: No  .  Drug use: No  . Sexual activity: Not on file  Lifestyle  . Physical activity:    Days per week: Not on file    Minutes per session: Not on file  . Stress: Not on file  Relationships  . Social connections:    Talks on phone: Not on file    Gets together: Not on file    Attends religious service: Not on file    Active member of club or organization: Not on file    Attends meetings of clubs or organizations: Not on file    Relationship status: Not on file  . Intimate partner violence:    Fear of current or ex partner: Not on file    Emotionally abused: Not on file    Physically abused: Not on file    Forced sexual activity: Not on file  Other Topics Concern  . Not on file  Social History Narrative   He lives with his wife [name](, has 2 children, retired. Hard of hearing.   Right handed.   Caffeine- None     PHYSICAL EXAM  Vitals:   05/24/18 1304  BP: 122/60  Pulse: 69  Weight: 219 lb 6.4 oz (99.5 kg)  Height: 5\' 9"  (1.753 m)   Body mass index is 32.4 kg/m.  Generalized: Well developed, in no acute distress  Head: normocephalic and atraumatic,. Oropharynx benign   Neurological examination   Mentation: Alert oriented to time, place, history taking. Attention span and concentration appropriate. Recent and remote memory intact.  Follows all commands speech and language fluent.   Cranial nerve II-XII: Pupils were equal round reactive to light extraocular movements were full, visual field were full on confrontational test. Facial sensation and strength were normal. hearing was intact to finger rubbing bilaterally. Uvula tongue midline. head turning and shoulder shrug were normal and symmetric.Tongue protrusion into cheek strength was normal.With superior gaze for 1 minute no diplopia or ptosis noted. Motor: normal bulk and tone, full strength in the BUE, BLE,With arms abducted for 1 minute no weakness noted. Sensory: normal  and symmetric to light touch,  Coordination:  finger-nose-finger, heel-to-shin bilaterally, no dysmetria Reflexes: Symmetric upper and lower, plantar responses were flexor bilaterally. Gait and Station: Rising up from seated position without assistance, normal stance,  moderate stride, good arm swing, smooth turning, able to perform tiptoe, and heel walking without difficulty. Tandem gait is steady  DIAGNOSTIC DATA (LABS, IMAGING, TESTING) - I reviewed patient records, labs, notes, testing and imaging myself where available.  Lab Results  Component Value Date   WBC 5.6 10/25/2016   HGB 14.4 10/25/2016   HCT 43.5 10/25/2016   MCV 91.6 10/25/2016   PLT 191 10/25/2016      Component Value Date/Time   NA 135 10/25/2016 0951   NA 140 08/19/2014 1304   K 4.7 10/25/2016 0951   CL 98 (L) 10/25/2016 0951   CO2 30 10/25/2016 0951   GLUCOSE 134 (H) 10/25/2016 0951   BUN 16 10/25/2016 0951   BUN 18 08/19/2014 1304   CREATININE 1.50 (H) 10/25/2016 0951   CALCIUM 9.4 10/25/2016 0951   PROT 7.3 10/25/2016 0951   PROT 6.8 08/19/2014 1304   ALBUMIN 4.1 10/25/2016 0951   ALBUMIN 4.2 08/19/2014 1304   AST 26 10/25/2016 0951   ALT 20 10/25/2016 0951   ALKPHOS 55 10/25/2016 0951   BILITOT 0.8 10/25/2016 0951   BILITOT 0.4 08/19/2014 1304   GFRNONAA 42 (L) 10/25/2016 0951   GFRAA 49 (L) 10/25/2016 0951    Lab Results  Component Value Date   VITAMINB12 1,480 (H) 01/21/2016   Lab Results  Component Value Date   TSH 2.010 08/25/2016      ASSESSMENT AND PLAN   85 y.o. year old male  has a past medical history of B12 deficiency, Essential tremor, Hypertension, Memory deficit, Myasthenia gravis ([place]), Pulmonary emboli ([place]), and Skin cancer. here to follow-up for  1. Myasthenia gravis   PLAN: Continue Mestinon 4 times a day will refill Call for any worsening symptoms CBC CMP today follow-up in 6 months   Dennie Bible, [facility], [facility], [facility] [address], [place], [place]  [id number] [phone]

## 2018-05-24 ENCOUNTER — Ambulatory Visit (INDEPENDENT_AMBULATORY_CARE_PROVIDER_SITE_OTHER): Payer: Medicare Other | Admitting: Nurse Practitioner

## 2018-05-24 ENCOUNTER — Encounter: Payer: Self-pay | Admitting: Nurse Practitioner

## 2018-05-24 VITALS — BP 122/60 | HR 69 | Ht 69.0 in | Wt 219.4 lb

## 2018-05-24 DIAGNOSIS — G7000 Myasthenia gravis without (acute) exacerbation: Secondary | ICD-10-CM

## 2018-05-24 DIAGNOSIS — Z5181 Encounter for therapeutic drug level monitoring: Secondary | ICD-10-CM | POA: Diagnosis not present

## 2018-05-24 MED ORDER — PYRIDOSTIGMINE BROMIDE 60 MG PO TABS: 60.0000 mg | ORAL_TABLET | Freq: Four times a day (QID) | ORAL | 3 refills | Status: DC

## 2018-05-24 NOTE — Patient Instructions (Signed)
Continue Mestinon 4 times a day will refill Call for any worsening symptoms CBC CMP today follow-up in 6 months

## 2018-05-25 ENCOUNTER — Telehealth: Payer: Self-pay | Admitting: *Deleted

## 2018-05-25 DIAGNOSIS — Z86711 Personal history of pulmonary embolism: Secondary | ICD-10-CM | POA: Diagnosis not present

## 2018-05-25 LAB — CBC WITH DIFFERENTIAL/PLATELET
BASOS: 1 %
Basophils Absolute: 0.1 10*3/uL (ref 0.0–0.2)
EOS (ABSOLUTE): 0.1 10*3/uL (ref 0.0–0.4)
Eos: 1 %
Hematocrit: 43.5 % (ref 37.5–51.0)
Hemoglobin: 14.8 g/dL (ref 13.0–17.7)
IMMATURE GRANULOCYTES: 0 %
Immature Grans (Abs): 0 10*3/uL (ref 0.0–0.1)
LYMPHS ABS: 1 10*3/uL (ref 0.7–3.1)
Lymphs: 21 %
MCH: 31 pg (ref 26.6–33.0)
MCHC: 34 g/dL (ref 31.5–35.7)
MCV: 91 fL (ref 79–97)
Monocytes Absolute: 0.4 10*3/uL (ref 0.1–0.9)
Monocytes: 9 %
NEUTROS PCT: 68 %
Neutrophils Absolute: 3.4 10*3/uL (ref 1.4–7.0)
PLATELETS: 192 10*3/uL (ref 150–450)
RBC: 4.78 x10E6/uL (ref 4.14–5.80)
RDW: 13 % (ref 11.6–15.4)
WBC: 4.9 10*3/uL (ref 3.4–10.8)

## 2018-05-25 LAB — COMPREHENSIVE METABOLIC PANEL
ALK PHOS: 56 IU/L (ref 39–117)
ALT: 21 IU/L (ref 0–44)
AST: 25 IU/L (ref 0–40)
Albumin/Globulin Ratio: 2.1 (ref 1.2–2.2)
Albumin: 4.4 g/dL (ref 3.5–4.7)
BUN/Creatinine Ratio: 11 (ref 10–24)
BUN: 17 mg/dL (ref 8–27)
Bilirubin Total: 0.6 mg/dL (ref 0.0–1.2)
CO2: 27 mmol/L (ref 20–29)
Calcium: 9.6 mg/dL (ref 8.6–10.2)
Chloride: 99 mmol/L (ref 96–106)
Creatinine, Ser: 1.6 mg/dL — ABNORMAL HIGH (ref 0.76–1.27)
GFR calc Af Amer: 46 mL/min/{1.73_m2} — ABNORMAL LOW (ref >59)
GFR, EST NON AFRICAN AMERICAN: 40 mL/min/{1.73_m2} — AB (ref >59)
GLOBULIN, TOTAL: 2.1 g/dL (ref 1.5–4.5)
Glucose: 160 mg/dL — ABNORMAL HIGH (ref 65–99)
Potassium: 4.9 mmol/L (ref 3.5–5.2)
SODIUM: 141 mmol/L (ref 134–144)
Total Protein: 6.5 g/dL (ref 6.0–8.5)

## 2018-05-25 NOTE — Telephone Encounter (Signed)
LVM requesting call back re: lab results.  

## 2018-05-25 NOTE — Telephone Encounter (Signed)
Pt wife [name] has called RN [facility] back, she is asking for a call back

## 2018-05-25 NOTE — Progress Notes (Signed)
I have reviewed and agreed above plan. 

## 2018-05-25 NOTE — Telephone Encounter (Signed)
Called wife and informed her that patient's labs are stable except for continued elevated creatinine.  I told her I faxed his lab report to Dr Doristine Johns, primary care. Advised her the PCP may feel he needs to be seen by a kidney specialist. He does not have upcoming appt with PCP. I advised wife to call PCP in a few days to check on his response.  She verbalized understanding, appreciation. Labs successfully faxed to PCP.

## 2018-06-22 DIAGNOSIS — I2699 Other pulmonary embolism without acute cor pulmonale: Secondary | ICD-10-CM | POA: Diagnosis not present

## 2018-06-22 DIAGNOSIS — I82401 Acute embolism and thrombosis of unspecified deep veins of right lower extremity: Secondary | ICD-10-CM | POA: Diagnosis not present

## 2018-08-01 DIAGNOSIS — Z86711 Personal history of pulmonary embolism: Secondary | ICD-10-CM | POA: Diagnosis not present

## 2018-08-01 DIAGNOSIS — I82401 Acute embolism and thrombosis of unspecified deep veins of right lower extremity: Secondary | ICD-10-CM | POA: Diagnosis not present

## 2018-08-12 ENCOUNTER — Other Ambulatory Visit: Payer: Self-pay | Admitting: Neurology

## 2018-09-12 DIAGNOSIS — I2699 Other pulmonary embolism without acute cor pulmonale: Secondary | ICD-10-CM | POA: Diagnosis not present

## 2018-09-13 DIAGNOSIS — M1732 Unilateral post-traumatic osteoarthritis, left knee: Secondary | ICD-10-CM | POA: Diagnosis not present

## 2018-09-15 DIAGNOSIS — D485 Neoplasm of uncertain behavior of skin: Secondary | ICD-10-CM | POA: Diagnosis not present

## 2018-09-15 DIAGNOSIS — C44329 Squamous cell carcinoma of skin of other parts of face: Secondary | ICD-10-CM | POA: Diagnosis not present

## 2018-09-15 DIAGNOSIS — L308 Other specified dermatitis: Secondary | ICD-10-CM | POA: Diagnosis not present

## 2018-09-15 DIAGNOSIS — L814 Other melanin hyperpigmentation: Secondary | ICD-10-CM | POA: Diagnosis not present

## 2018-09-15 DIAGNOSIS — L570 Actinic keratosis: Secondary | ICD-10-CM | POA: Diagnosis not present

## 2018-09-15 DIAGNOSIS — C44612 Basal cell carcinoma of skin of right upper limb, including shoulder: Secondary | ICD-10-CM | POA: Diagnosis not present

## 2018-09-15 DIAGNOSIS — C44529 Squamous cell carcinoma of skin of other part of trunk: Secondary | ICD-10-CM | POA: Diagnosis not present

## 2018-09-18 ENCOUNTER — Ambulatory Visit (INDEPENDENT_AMBULATORY_CARE_PROVIDER_SITE_OTHER): Payer: Medicare Other | Admitting: Neurology

## 2018-09-18 ENCOUNTER — Other Ambulatory Visit: Payer: Self-pay

## 2018-09-18 ENCOUNTER — Telehealth: Payer: Self-pay | Admitting: Neurology

## 2018-09-18 DIAGNOSIS — G7000 Myasthenia gravis without (acute) exacerbation: Secondary | ICD-10-CM | POA: Diagnosis not present

## 2018-09-18 DIAGNOSIS — I2782 Chronic pulmonary embolism: Secondary | ICD-10-CM | POA: Diagnosis not present

## 2018-09-18 NOTE — Telephone Encounter (Signed)
I spoke to the patient's wife this morning.  He has been experiencing more difficulty swallowing recently.  Three days ago, he decided to increase his Mestinon 60mg , from four tabs daily to five tabs daily. His wife feels the additional medication has been some helpful.   He has been scheduled for a telephone visit today with Dr. [name].  They do not know how to use their smart phone and do not have the internet in their home.

## 2018-09-18 NOTE — Telephone Encounter (Signed)
His weight is 100 kg,   Full treatment dose should be 200gram,   50g x 2 days=1g/kg, that was maintenance treatment, initial treatment dose should be 2g/kg,   May be divided into 3-4 days.

## 2018-09-18 NOTE — Telephone Encounter (Signed)
Please arrange IVIG treatment 2g/kg total, previously, I remember that he could not tolerate 1g/kg, has to be divided into longer infusion duration 3-4 days?  I would also want to see him in clinic face to face, add on any of the injection day would be fine.

## 2018-09-18 NOTE — Progress Notes (Signed)
[facility]  PATIENT: Calvin Sloan DOB: Oct 07, 1933  HISTORY OF PRESENT ILLNESS: HISTORY 02/18/15 Calvin Sloan): Calvin Sloan is a 85 -year-old right-handed white married male with generalized seropositive myasthenia gravis characterized by bulbar weakness, shortness of breath , and dysphagia. Patient of [name]  He was diagnosed in 10/2002 with a positive Tensilon test, positive acetylchoine receptor antibody,and had a positive ANA at 1-160 speckled pattern. Initially he had right eye ptosis and subsequently, double vision, fatigue with chewing, dysphagia, and upper and lower extremity weakness. He was initially treated with Mestinon bromide in June 2004, which was associated with GI side effects.  Prednisone was started in 03/29/2003 and CellCept 05/2003. He developed increased jitteriness, rash, and cramps on CellCept which was discontinued 10/2003. He had progressive bulbar weakness and was admitted 12/2003 for IV IgG therapy which required a PEG tube for dysphagia transiently. He was started on Imuran. 01/17/2004 he was readmitted for 2 days of IV IgG because of shortness of breath. He has not been hospitalized since 01/2004.   He has been slowly tapered off prednisone 09/2007 and was on Imuran and Mestinon bromide.   He had B12 deficiency and was receiving B12 shots ,but B12 levels off injections have been normal and B12 shots were discontinued.  He has a history of vague visual loss on the left of unknown etiology. He denies swallowing problems, speech problems, voice changes, focal weakness, or double vision.  He is independent in activities of daily living and takes one nap per day.He denies memory loss but his wife states that he does have memory loss. His last blood studies in my office 05/27/10 were normal except a low white blood cell count of 3900.  His dermatologist [name] was concerned that the Imuran is associated with increased risk of skin cancer  and the imuran was discontinued in 12/30/2010. His skin cancer (basal cell cancer) has improved since his imuran was stopped.  He has noted no change In myasthenic symptoms. His skin has improved. His medication for myasthenia is mesinon bromide 60 milligrams one and one half tablets 4 times a day. He has shortness of breath since his pulmonary emboli in 2003, also complains of excessive fatigue.   He is allergic to aspirin, GI side effect, gastric ulcer, and is on Plavix for a history of TIAs with right arm weakness He has swallowing problems at the beginning of the meal with liquids that improves as the meal continues.   He had right CTS release in May 2014 by [name].  He complains of shortness of breath with walking, bending over to tire his shoed, due to PE 2003, pulmnolgist folllowed up, he does not like to chew on tough beef, he denies swallowing difficulty, no double vision, no droopy eyelid, no significant bleeding muscle weakness. He is active in yard, shop, threadmill   UPDATE Jan 16th 2015: He is now tapering off Mestinon, there was no significant change in his functional status, he denies double vision, no ptosis, continue complains of shortness of breath with exertion no significant and muscle weakness, he has to wash down his food sometimes, but no significant swelling, or chewing difficulty, no dysarthria  UPDATE July 16th 2015: He is no longer mestinon, he does complains of increased breathing difficulty from prolonged walking, no chewing difficulty, no double vision, no ptosis.  Laboratory continue to demonstrate positive acetylcholine binding, and modulating antibodies. He did reported that his skin cancer has improved after stopping imuran, could  not tolerate cellcept per record. Previously he responded to IVIG very well.  UPDATE Sept 25th 2015: He was stated on prednisone 10mg  qday, and mestinon 60mg  tid in June 2015, which did help his fatigue, shortness of  breath, wife reported 50% improvement, he has increased appetite  But he was admitted to [facility] in Sept 17th for bilateral PEs, right leg DVT, he is started on coumadin, bridge with Lovenox, [name] is monitoring his INR  He denies double vision, no ptosis, no limb muscle weakness, no dysphagia  UPDATE April 4th 2016: He is on coumadin for PE, INR was monitored by [name], his myasthenia gravis is under good control, is taking prednisone 10 mg every day, he has surgery in Nov 2015 for right leg cellulitis.  He only does little around his house, he has no double vision, SOB due to previous PE,   UPDATE Feb 18 2015: Previous laboratory evaluation showed mild B12 deficiency with level of 204, He did receive IM B12 supplement at his primary care Dr. [name] office for a while, now with normal repeat laboratory evaluation, injection has stopped He has mild choking difficulty, this has been going on since August 2016, not getting worse, he has more trouble with liquid. He complains of shortness of breath when bending over. He denies significant gait difficulty, no breathing difficulty otherwise, he denies double vision, no ptosis,, No chewing difficulties   UPDATE May 22 2015: He came in last visit April 16 2015 complains of worsening blurry vision, double vision, swallowing difficulties, trouble talking, he reported 50% weakness compared to baseline, difficulty getting up from seated position, difficulty chewing, worsening shortness of breath, on examination, he was noted to have increased weakness compared to previous examination, he had moderate eye-closure cheek puff, mild neck flexion weakness, mild bilateral shoulder abduction bilateral hip flexion weakness.  He was treated with IVIG 400 mg/kg every day for 5 days from December 11 to May 02 2015, his vision has much improved, he can walk better, but he continue have swallowing difficulty, dysarthria, he is also  taking prednisone 40 mg every day, increase his Mestinon 60 mg to 3-4 tablets daily  He was not able to tolerate immunosuppression treatment in the past due to skin cancer  UPDATE Jul 07 2015:  I have called his wife, pulmonary function test in June 05 2015.  1, FVC is 3.1 5, 87%, this is normal 2. FEV1 is 2.1 6, 77%, with a percent FEV1 of 68, this indicated mild to moderate primary small obstruction in this patient was a history of cigarette smoking, large elderly flow rates are generally normal  3. Flow volume loop is unremarkable 4, lung volumes indicate some evidence of air trapping with an increased residual volume, the TLC is normal, the increased residual volume confirms the presence of airflow obstruction  5, DLCO is 19.0, 90%, this is normal 6, elderly resistant 3.7 2, 265%, this is increased  Wife reported, he recently had swallowing study, there was no significant abnormality found, his swallowing, breathing, symptoms overall has improved  He did think that IVIG treatment in Dec 11-16th 2016 has helped his blurry vision, it is gone, he still feel straggled sometimes, he has shortness with exertion and bending over. His talking is much better.  UPDATE May 22nd 2017: He continue complains of shortness of breath with minimum exertion, no diplopia, no double vision, mild dysphasia,  He had a history of right carpal tunnel release surgery in the past, which  has been helpful, now complains of left first 3 finger paresthesia, EMG nerve conduction studies was planned at local hospital  UPDATE July 6th 2017: He stopped taking Mestinon around May 20 third 2017, shortly afterwards, he noticed double vision, he clearly describe eighth of binocular double vision, sometimes vertigo, sometimes horizontal, he went on higher dose of Mestinon 60 mg 4 times a day without improving his symptoms, he slept on a bed with 30 head raise, with no significant breathing difficulty, he  could not eat his dinner on July 5th 2017 due to chewing swallowing difficulty, he could not fly flat sleeping, mildly unsteady gait, increased fatigue,  UPDATE August 17th 2017: Last IVIG treatment was December 05 2015, he did very well for a while, was able to cut grass, trim his lawn, it did helped him for at least 2 weeks, then he had passing out   I reviewed laboratory evaluation in August 2017, normal B12, ferritin level, iron binding capacity was within normal limit, low hemoglobin 10 point 8, creatinine was elevated 1.53, glucose was 125  Previous laboratory evaluation in 2016 showed normal folic acid 12 point 5,low normal B12 level 266, hemoglobin in January 2015 was 14 point 3  He will be evaluated by hematologist/oncologist Dr. Mervin Kung in September 6th 2017, at [facility]  He complains of low back pain since he fell you August 1st 2017, has been taking intermittent Tylenol,  He complains of difficulty swallowing, both liquid, and solid food, no double vision, He is taking mestinon 60mg  4 times a day, which did help him.  Update January 29 2016: I have reviewed his hematologist Dr. Grier Mitts note on September 6th 2017, normocytic microchromic anemia, normal WBC, platelet count, likely from chronic kidney disease, history of fetal deficiency, is on B12 supplement, there was a consideration of low level hemolytic anemia from IVIG infusion, no overt evidence of blood loss  I reviewed laboratory evaluation in September 2017, parietal cell antibody IgG was negative, intrinsic factor negative, mild elevated kappa free light chain, Hg 13.8, creat 1.38, GFR 47, ferritin 163, normal TSh 1.5, elevated vitamin B12 1480  He complains more fatigue, more swallowing difficulty, decreased po intake, he could not drinking water because worry about the choking episode, increased shortness of breath with exertion, intermittent blurry version, difficulty closing his eyes.  He is now taking  mestinon 60mg  qid, no significant side effect, was not sure about the benefit.  Also reviewed swallowing evaluation on June 06 2015 at [facility], there was mild oropharyngeal dysphagia, characterized by spillover to the valleculae and the piriform sinus secondary to reduced oral motor coordination  patient was felt to safe to consume a regular diet with a full range of liquid with recommendation of upright for food by mouth, and 45 minutes after meals, head flexed slightly forward , swallow at slower rate  UPDATE Oct 16th 2017: I reviewed laboratory in October 2017, immune of fixative electrophoresis showed mild elevated Ig G2.8, otherwise was normal, mild anemia hemoglobin 11.7, mild elevated kappa.free light chain 26, mild elevated creatinine 1.38, normal ferritin 163, normal iron panel, TSH, folic acid, N81 [id number],  Last ivig was in Sept, it has helped him 80%, he can chew and swallow, he continue complains of blurry vision,  UPDATE May 05 2016: He is overall much improved, but still complains of SOB, is at his baseline, last infusion was on Nov 27, 28th, he will have more infusion in December, he has mild dysarthria, slight dysphagia  will see hematologist Dr. Vivianne Spence January 2018,    Today 10/26/16:  Mr. Dimare is an 85 year old male with a history of myasthenia gravis. He returns today for follow-up. He states IVIG has been working well for him. His last infusion was in December. He denies any ptosis, diplopia or weakness in the extremities. He reports ongoing trouble with breathing. He reports he's had lung function tests in the past. He denies any significant trouble with swallowing. He does notice he has trouble swallowing pills. Denies any trouble chewing his food. He does report some left TMJ pain. He has not taking any over-the-counter medication as he is worried that it will interfere with Coumadin. He returns today for evaluation.   Virtual Visit via  telephone  I connected with Calvin Sloan on 09/18/18 at  by phone  and verified that I am speaking with the correct person using two identifiers.   I discussed the limitations, risks, security and privacy concerns of performing an evaluation and management service by phone  and the availability of in person appointments. I also discussed with the patient that there may be a patient responsible charge related to this service. The patient expressed understanding and agreed to proceed.   History of Present Illness: Patient and his wife are present during today's telephone interview, he noticed difficulty swallowing since end of April 2020, gradually getting worse over the weekend, he has more trouble with solid food, is easier on the liquid, is now eating modified diet, potato soups, spaghetti, he denies limb muscle weakness, no droopy eyelid, no double vision.  He has self increase his Mestinon to 60 mg 5 times a day, complains some GI side effect, he denies breathing difficulty when resting, has baseline mild shortness of breath with exertion due to previous history of pulmonary emboli,, previous pulmonary function showed evidence of small airway constriction   Observations/Objective: I have reviewed problem lists, medications, allergies.  He has mild slow dysarthria, awake alert oriented to history taking and care of conversation  Assessment and Plan: Seropositive myasthenia gravis  Not a good candidate for long-term immunosuppressive treatment due to recurrent large area of skin cancer,  Responded very well to previous IVIG treatment, loading dose was in September 2017, initial dose 2 g/kg then every 4 weeks 1 g/kg, last infusion was in May 12 2016.  Recurrent swallowing difficulty since end of April 2020,  Will reinitiate IVIG treatment, loading dose 2 g/kg, followed by 1 g/kg every 28 days for total of 6 treatment,  Because of his shortness of breath, will divided the infusion of 2  g/kg into 4 days  Baseline shortness of breath  Due to previous PE, on chronic Coumadin treatment, previous pulmonary functional tests showed evidence of small airway constriction  Follow Up Instructions:   On Oct 04, 2018   I discussed the assessment and treatment plan with the patient. The patient was provided an opportunity to ask questions and all were answered. The patient agreed with the plan and demonstrated an understanding of the instructions.   The patient was advised to call back or seek an in-person evaluation if the symptoms worsen or if the condition fails to improve as anticipated.  I provided 30 minutes of non-face-to-face time during this encounter.   Marcial Pacas, MD

## 2018-09-18 NOTE — Telephone Encounter (Signed)
pts wife called in and wanted to know if pt can be seen sooner due to diffiulty swallowing

## 2018-09-18 NOTE — Telephone Encounter (Addendum)
[facility] orders for IVIG for approval:  Initial treatment:  IVIG 2g/kg - total 200gram divided over four days for one treatment.  Medicate with:  1) Benadryl 25mg  IV, q6h prn 2) Tylenol 500mg  PO, q4h prn  [facility] also needs permission to leave IV in place.  Okay to place Hep-lock flush and secure with bandage for patient to return the following day for continuation of IV therapy? They will also check vitals prior to d/c home.   Do you want to add a maintenance dose with the same protocol for Benadryl and Tylenol?  If so, for how many treatments?   Yes, Maintenance dose 1g/kg x5, every 28 days,  premed with Benadryl and Tyleno.

## 2018-09-18 NOTE — Telephone Encounter (Signed)
Per [facility], his last IVIG was in 04/2016.  At that time, he received IVIG 50 grams over 2 days. [facility] needs a new detailed order for his IVIG written out.  How would you like the order submitted for this treatment?    He has been scheduled for an in-office visit on 10/04/2018 at 8am.  I spoke to his wife and this appt date and time works well for them.

## 2018-09-19 ENCOUNTER — Encounter: Payer: Self-pay | Admitting: Neurology

## 2018-09-19 NOTE — Telephone Encounter (Signed)
1) Okay to hep-lock?  [facility] needs MD approval.  2) Okay to give maintenance dose of 1g/kg over two days x five treatments?

## 2018-09-19 NOTE — Telephone Encounter (Signed)
Yes, it is oK 

## 2018-09-19 NOTE — Telephone Encounter (Signed)
IVIG orders written per instructions below, signed by Dr. Felecia Shelling for Dr. [name] while she was out of office.  The orders have been provided to [facility].

## 2018-09-21 ENCOUNTER — Telehealth: Payer: Self-pay | Admitting: Neurology

## 2018-09-21 NOTE — Telephone Encounter (Signed)
Printed message and gave to [facility] to contact wife back with update.

## 2018-09-21 NOTE — Telephone Encounter (Signed)
Pt's wife called to get an update on the PA from the insurance for the pts IVIG treatment. Please advise.

## 2018-10-04 ENCOUNTER — Ambulatory Visit (INDEPENDENT_AMBULATORY_CARE_PROVIDER_SITE_OTHER): Payer: Medicare Other | Admitting: Neurology

## 2018-10-04 ENCOUNTER — Encounter: Payer: Self-pay | Admitting: Neurology

## 2018-10-04 ENCOUNTER — Other Ambulatory Visit: Payer: Self-pay

## 2018-10-04 VITALS — BP 155/73 | HR 67 | Temp 98.6°F | Ht 69.0 in | Wt 212.0 lb

## 2018-10-04 DIAGNOSIS — G7001 Myasthenia gravis with (acute) exacerbation: Secondary | ICD-10-CM

## 2018-10-04 MED ORDER — PREDNISONE 10 MG PO TABS: 20.0000 mg | ORAL_TABLET | Freq: Every day | ORAL | 3 refills | Status: DC

## 2018-10-04 NOTE — Patient Instructions (Addendum)
[place], [place].-March 08, 2016-[employer] ([employer]) announced today that the U.S. Food and Drug Administration (FDA) has approved Soliris (eculizumab) as a treatment for adult patients with generalized myasthenia gravis (gMG) who are anti-acetylcholine receptor (AchR) antibody-positive. In the Phase 3 REGAIN study and its ongoing open-label extension study, Soliris demonstrated treatment benefits for patients with anti-AchR antibody-positive gMG who had previously failed immunosuppressive treatment and continued to suffer from significant unresolved disease symptoms, which can include difficulties seeing, walking, talking, swallowing and breathing. These patients are at an increased risk of disease exacerbations and crises that may require hospitalization and intensive care and may be life-threatening. These patients represent approximately 5-10% of all patients with MG  Prednisone 10mg   2 tabs every morning for 2 weeks,  Then 10mg  daily   Call from progress report once you see improvement of your symptoms, We can adjust prednisone dose over the virtual visit.

## 2018-10-04 NOTE — Progress Notes (Signed)
[facility]  PATIENT: Calvin Sloan DOB: 08-14-1933   REASON FOR VISIT: follow up [place] and wife    HISTORY OF PRESENT ILLNESS: HISTORY 02/18/15 [name]: Calvin Sloan is a 85 -year-old right-handed white married male with generalized seropositive myasthenia gravis characterized by bulbar weakness, shortness of breath , and dysphagia. Patient of [name]  He was diagnosed in 10/2002 with a positive Tensilon test, positive acetylchoine receptor antibody,and had a positive ANA at 1-160 speckled pattern. Initially he had right eye ptosis and subsequently, double vision, fatigue with chewing, dysphagia, and upper and lower extremity weakness. He was initially treated with Mestinon bromide in June 2004, which was associated with GI side effects.  Prednisone was started in 03/29/2003 and CellCept 05/2003. He developed increased jitteriness, rash, and cramps on CellCept which was discontinued 10/2003. He had progressive bulbar weakness and was admitted 12/2003 for IV IgG therapy which required a PEG tube for dysphagia transiently. He was started on Imuran. 01/17/2004 he was readmitted for 2 days of IV IgG because of shortness of breath. He has not been hospitalized since 01/2004.   He has been slowly tapered off prednisone 09/2007 and was on Imuran and Mestinon bromide.   He had B12 deficiency and was receiving B12 shots ,but B12 levels off injections have been normal and B12 shots were discontinued.  He has a history of vague visual loss on the left of unknown etiology. He denies swallowing problems, speech problems, voice changes, focal weakness, or double vision.  He is independent in activities of daily living and takes one nap per day.He denies memory loss but his wife states that he does have memory loss. His last blood studies in my office 05/27/10 were normal except a low white blood cell count of 3900.  His dermatologist [name]  was concerned that the Imuran is associated with increased risk of skin cancer and the imuran was discontinued in 12/30/2010. His skin cancer (basal cell cancer) has improved since his imuran was stopped.  He has noted no change In myasthenic symptoms. His skin has improved. His medication for myasthenia is mesinon bromide 60 milligrams one and one half tablets 4 times a day. He has shortness of breath since his pulmonary emboli in 2003, also complains of excessive fatigue.   He is allergic to aspirin, GI side effect, gastric ulcer, and is on Plavix for a history of TIAs with right arm weakness He has swallowing problems at the beginning of the meal with liquids that improves as the meal continues.   He had right CTS release in May 2014 by [name].  He complains of shortness of breath with walking, bending over to tire his shoed, due to PE 2003, pulmnolgist folllowed up, he does not like to chew on tough beef, he denies swallowing difficulty, no double vision, no droopy eyelid, no significant bleeding muscle weakness. He is active in yard, shop, threadmill   UPDATE Jan 16th 2015: He is now tapering off Mestinon, there was no significant change in his functional status, he denies double vision, no ptosis, continue complains of shortness of breath with exertion no significant and muscle weakness, he has to wash down his food sometimes, but no significant swelling, or chewing difficulty, no dysarthria  UPDATE July 16th 2015: He is no longer mestinon, he does complains of increased breathing difficulty from prolonged walking, no chewing difficulty, no double vision, no ptosis.  Laboratory continue to demonstrate positive acetylcholine  binding, and modulating antibodies. He did reported that his skin cancer has improved after stopping imuran, could not tolerate cellcept per record. Previously he responded to IVIG very well.  UPDATE Sept 25th 2015: He was stated on prednisone 10mg  qday,  and mestinon 60mg  tid in June 2015, which did help his fatigue, shortness of breath, wife reported 50% improvement, he has increased appetite  But he was admitted to [facility] hospital in Sept 17th for bilateral PEs, right leg DVT, he is started on coumadin, bridge with Lovenox, [name] is monitoring his INR  He denies double vision, no ptosis, no limb muscle weakness, no dysphagia  UPDATE April 4th 2016: He is on coumadin for PE, INR was monitored by [name], his myasthenia gravis is under good control, is taking prednisone 10 mg every day, he has surgery in Nov 2015 for right leg cellulitis.  He only does little around his house, he has no double vision, SOB due to previous PE,   UPDATE Feb 18 2015: Previous laboratory evaluation showed mild B12 deficiency with level of 204, He did receive IM B12 supplement at his primary care Dr. [name] office for a while, now with normal repeat laboratory evaluation, injection has stopped He has mild choking difficulty, this has been going on since August 2016, not getting worse, he has more trouble with liquid. He complains of shortness of breath when bending over. He denies significant gait difficulty, no breathing difficulty otherwise, he denies double vision, no ptosis,, No chewing difficulties   UPDATE May 22 2015: He came in last visit April 16 2015 complains of worsening blurry vision, double vision, swallowing difficulties, trouble talking, he reported 50% weakness compared to baseline, difficulty getting up from seated position, difficulty chewing, worsening shortness of breath, on examination, he was noted to have increased weakness compared to previous examination, he had moderate eye-closure cheek puff, mild neck flexion weakness, mild bilateral shoulder abduction bilateral hip flexion weakness.  He was treated with IVIG 400 mg/kg every day for 5 days from December 11 to May 02 2015, his vision has much improved, he can walk  better, but he continue have swallowing difficulty, dysarthria, he is also taking prednisone 40 mg every day, increase his Mestinon 60 mg to 3-4 tablets daily  He was not able to tolerate immunosuppression treatment in the past due to skin cancer  UPDATE Jul 07 2015:  I have called his wife, pulmonary function test in June 05 2015.  1, FVC is 3.1 5, 87%, this is normal 2. FEV1 is 2.1 6, 77%, with a percent FEV1 of 68, this indicated mild to moderate primary small obstruction in this patient was a history of cigarette smoking, large elderly flow rates are generally normal  3. Flow volume loop is unremarkable 4, lung volumes indicate some evidence of air trapping with an increased residual volume, the TLC is normal, the increased residual volume confirms the presence of airflow obstruction  5, DLCO is 19.0, 90%, this is normal 6, elderly resistant 3.7 2, 265%, this is increased  Wife reported, he recently had swallowing study, there was no significant abnormality found, his swallowing, breathing, symptoms overall has improved  He did think that IVIG treatment in Dec 11-16th 2016 has helped his blurry vision, it is gone, he still feel straggled sometimes, he has shortness with exertion and bending over. His talking is much better.  UPDATE May 22nd 2017: He continue complains of shortness of breath with minimum exertion, no diplopia, no double vision,  mild dysphasia,  He had a history of right carpal tunnel release surgery in the past, which has been helpful, now complains of left first 3 finger paresthesia, EMG nerve conduction studies was planned at local hospital  UPDATE July 6th 2017: He stopped taking Mestinon around May 20 third 2017, shortly afterwards, he noticed double vision, he clearly describe eighth of binocular double vision, sometimes vertigo, sometimes horizontal, he went on higher dose of Mestinon 60 mg 4 times a day without improving his symptoms, he slept on a  bed with 30 head raise, with no significant breathing difficulty, he could not eat his dinner on July 5th 2017 due to chewing swallowing difficulty, he could not fly flat sleeping, mildly unsteady gait, increased fatigue,  UPDATE August 17th 2017: Last IVIG treatment was December 05 2015, he did very well for a while, was able to cut grass, trim his lawn, it did helped him for at least 2 weeks, then he had passing out   I reviewed laboratory evaluation in August 2017, normal B12, ferritin level, iron binding capacity was within normal limit, low hemoglobin 10 point 8, creatinine was elevated 1.53, glucose was 125  Previous laboratory evaluation in 2016 showed normal folic acid 12 point 5,low normal B12 level 266, hemoglobin in January 2015 was 14 point 3  He will be evaluated by hematologist/oncologist Dr. Mervin Kung in September 6th 2017, at [facility]  He complains of low back pain since he fell you August 1st 2017, has been taking intermittent Tylenol,  He complains of difficulty swallowing, both liquid, and solid food, no double vision, He is taking mestinon 60mg  4 times a day, which did help him.  Update January 29 2016: I have reviewed his hematologist Dr. Grier Mitts note on September 6th 2017, normocytic microchromic anemia, normal WBC, platelet count, likely from chronic kidney disease, history of fetal deficiency, is on B12 supplement, there was a consideration of low level hemolytic anemia from IVIG infusion, no overt evidence of blood loss  I reviewed laboratory evaluation in September 2017, parietal cell antibody IgG was negative, intrinsic factor negative, mild elevated kappa free light chain, Hg 13.8, creat 1.38, GFR 47, ferritin 163, normal TSh 1.5, elevated vitamin B12 1480  He complains more fatigue, more swallowing difficulty, decreased po intake, he could not drinking water because worry about the choking episode, increased shortness of breath with exertion,  intermittent blurry version, difficulty closing his eyes.  He is now taking mestinon 60mg  qid, no significant side effect, was not sure about the benefit.  Also reviewed swallowing evaluation on June 06 2015 at [facility], there was mild oropharyngeal dysphagia, characterized by spillover to the valleculae and the piriform sinus secondary to reduced oral motor coordination  patient was felt to safe to consume a regular diet with a full range of liquid with recommendation of upright for food by mouth, and 45 minutes after meals, head flexed slightly forward , swallow at slower rate  UPDATE Oct 16th 2017: I reviewed laboratory in October 2017, immune of fixative electrophoresis showed mild elevated Ig G2.8, otherwise was normal, mild anemia hemoglobin 11.7, mild elevated kappa.free light chain 26, mild elevated creatinine 1.38, normal ferritin 163, normal iron panel, TSH, folic acid, [id number],  Last ivig was in Sept, it has helped him 80%, he can chew and swallow, he continue complains of blurry vision,  UPDATE May 05 2016: He is overall much improved, but still complains of SOB, is at his baseline, last infusion was  on Nov 27, 28th, he will have more infusion in December, he has mild dysarthria, slight dysphagia will see hematologist Dr. Vivianne Spence January 2018,    HISTORY OF PRESENT ILLNESS: HISTORY 02/18/15 [name]: AERIK POLAN is a 37 -year-old right-handed white married male with generalized seropositive myasthenia gravis characterized by bulbar weakness, shortness of breath , and dysphagia. Patient of [name]  He was diagnosed in 10/2002 with a positive Tensilon test, positive acetylchoine receptor antibody,and had a positive ANA at 1-160 speckled pattern. Initially he had right eye ptosis and subsequently, double vision, fatigue with chewing, dysphagia, and upper and lower extremity weakness. He was initially treated with Mestinon bromide in June 2004, which  was associated with GI side effects.  Prednisone was started in 03/29/2003 and CellCept 05/2003. He developed increased jitteriness, rash, and cramps on CellCept which was discontinued 10/2003. He had progressive bulbar weakness and was admitted 12/2003 for IV IgG therapy which required a PEG tube for dysphagia transiently. He was started on Imuran. 01/17/2004 he was readmitted for 2 days of IV IgG because of shortness of breath. He has not been hospitalized since 01/2004.   He has been slowly tapered off prednisone 09/2007 and was on Imuran and Mestinon bromide.   He had B12 deficiency and was receiving B12 shots ,but B12 levels off injections have been normal and B12 shots were discontinued.  He has a history of vague visual loss on the left of unknown etiology. He denies swallowing problems, speech problems, voice changes, focal weakness, or double vision.  He is independent in activities of daily living and takes one nap per day.He denies memory loss but his wife states that he does have memory loss. His last blood studies in my office 05/27/10 were normal except a low white blood cell count of 3900.  His dermatologist [name] was concerned that the Imuran is associated with increased risk of skin cancer and the imuran was discontinued in 12/30/2010. His skin cancer (basal cell cancer) has improved since his imuran was stopped.  He has noted no change In myasthenic symptoms. His skin has improved. His medication for myasthenia is mesinon bromide 60 milligrams one and one half tablets 4 times a day. He has shortness of breath since his pulmonary emboli in 2003, also complains of excessive fatigue.   He is allergic to aspirin, GI side effect, gastric ulcer, and is on Plavix for a history of TIAs with right arm weakness He has swallowing problems at the beginning of the meal with liquids that improves as the meal continues.   He had right CTS release in May 2014 by Dr.  [name].  He complains of shortness of breath with walking, bending over to tire his shoed, due to PE 2003, pulmnolgist folllowed up, he does not like to chew on tough beef, he denies swallowing difficulty, no double vision, no droopy eyelid, no significant bleeding muscle weakness. He is active in yard, shop, threadmill   UPDATE Jan 16th 2015: He is now tapering off Mestinon, there was no significant change in his functional status, he denies double vision, no ptosis, continue complains of shortness of breath with exertion no significant and muscle weakness, he has to wash down his food sometimes, but no significant swelling, or chewing difficulty, no dysarthria  UPDATE July 16th 2015: He is no longer mestinon, he does complains of increased breathing difficulty from prolonged walking, no chewing difficulty, no double vision, no ptosis.  Laboratory continue to demonstrate positive acetylcholine binding,  and modulating antibodies. He did reported that his skin cancer has improved after stopping imuran, could not tolerate cellcept per record. Previously he responded to IVIG very well.  UPDATE Sept 25th 2015: He was stated on prednisone 10mg  qday, and mestinon 60mg  tid in June 2015, which did help his fatigue, shortness of breath, wife reported 50% improvement, he has increased appetite  But he was admitted to [facility] hospital in Sept 17th for bilateral PEs, right leg DVT, he is started on coumadin, bridge with Lovenox, [name] is monitoring his INR  He denies double vision, no ptosis, no limb muscle weakness, no dysphagia  UPDATE April 4th 2016: He is on coumadin for PE, INR was monitored by [name], his myasthenia gravis is under good control, is taking prednisone 10 mg every day, he has surgery in Nov 2015 for right leg cellulitis.  He only does little around his house, he has no double vision, SOB due to previous PE,   UPDATE Feb 18 2015: Previous laboratory evaluation  showed mild B12 deficiency with level of 204, He did receive IM B12 supplement at his primary care Dr. [name] office for a while, now with normal repeat laboratory evaluation, injection has stopped He has mild choking difficulty, this has been going on since August 2016, not getting worse, he has more trouble with liquid. He complains of shortness of breath when bending over. He denies significant gait difficulty, no breathing difficulty otherwise, he denies double vision, no ptosis,, No chewing difficulties   UPDATE May 22 2015: He came in last visit April 16 2015 complains of worsening blurry vision, double vision, swallowing difficulties, trouble talking, he reported 50% weakness compared to baseline, difficulty getting up from seated position, difficulty chewing, worsening shortness of breath, on examination, he was noted to have increased weakness compared to previous examination, he had moderate eye-closure cheek puff, mild neck flexion weakness, mild bilateral shoulder abduction bilateral hip flexion weakness.  He was treated with IVIG 400 mg/kg every day for 5 days from December 11 to May 02 2015, his vision has much improved, he can walk better, but he continue have swallowing difficulty, dysarthria, he is also taking prednisone 40 mg every day, increase his Mestinon 60 mg to 3-4 tablets daily  He was not able to tolerate immunosuppression treatment in the past due to skin cancer  UPDATE Jul 07 2015:  I have called his wife, pulmonary function test in June 05 2015.  1, FVC is 3.1 5, 87%, this is normal 2. FEV1 is 2.1 6, 77%, with a percent FEV1 of 68, this indicated mild to moderate primary small obstruction in this patient was a history of cigarette smoking, large elderly flow rates are generally normal  3. Flow volume loop is unremarkable 4, lung volumes indicate some evidence of air trapping with an increased residual volume, the TLC is normal, the increased  residual volume confirms the presence of airflow obstruction  5, DLCO is 19.0, 90%, this is normal 6, elderly resistant 3.7 2, 265%, this is increased  Wife reported, he recently had swallowing study, there was no significant abnormality found, his swallowing, breathing, symptoms overall has improved  He did think that IVIG treatment in Dec 11-16th 2016 has helped his blurry vision, it is gone, he still feel straggled sometimes, he has shortness with exertion and bending over. His talking is much better.  UPDATE May 22nd 2017: He continue complains of shortness of breath with minimum exertion, no diplopia, no double vision, mild  dysphasia,  He had a history of right carpal tunnel release surgery in the past, which has been helpful, now complains of left first 3 finger paresthesia, EMG nerve conduction studies was planned at local hospital  UPDATE July 6th 2017: He stopped taking Mestinon around May 20 third 2017, shortly afterwards, he noticed double vision, he clearly describe eighth of binocular double vision, sometimes vertigo, sometimes horizontal, he went on higher dose of Mestinon 60 mg 4 times a day without improving his symptoms, he slept on a bed with 30 head raise, with no significant breathing difficulty, he could not eat his dinner on July 5th 2017 due to chewing swallowing difficulty, he could not fly flat sleeping, mildly unsteady gait, increased fatigue,  UPDATE August 17th 2017: Last IVIG treatment was December 05 2015, he did very well for a while, was able to cut grass, trim his lawn, it did helped him for at least 2 weeks, then he had passing out   I reviewed laboratory evaluation in August 2017, normal B12, ferritin level, iron binding capacity was within normal limit, low hemoglobin 10 point 8, creatinine was elevated 1.53, glucose was 125  Previous laboratory evaluation in 2016 showed normal folic acid 12 point 5,low normal B12 level 266, hemoglobin in January 2015  was 14 point 3  He will be evaluated by hematologist/oncologist Dr. Mervin Kung in September 6th 2017, at [facility]  He complains of low back pain since he fell you August 1st 2017, has been taking intermittent Tylenol,  He complains of difficulty swallowing, both liquid, and solid food, no double vision, He is taking mestinon 60mg  4 times a day, which did help him.  Update January 29 2016: I have reviewed his hematologist Dr. Grier Mitts note on September 6th 2017, normocytic microchromic anemia, normal WBC, platelet count, likely from chronic kidney disease, history of fetal deficiency, is on B12 supplement, there was a consideration of low level hemolytic anemia from IVIG infusion, no overt evidence of blood loss  I reviewed laboratory evaluation in September 2017, parietal cell antibody IgG was negative, intrinsic factor negative, mild elevated kappa free light chain, Hg 13.8, creat 1.38, GFR 47, ferritin 163, normal TSh 1.5, elevated vitamin B12 1480  He complains more fatigue, more swallowing difficulty, decreased po intake, he could not drinking water because worry about the choking episode, increased shortness of breath with exertion, intermittent blurry version, difficulty closing his eyes.  He is now taking mestinon 60mg  qid, no significant side effect, was not sure about the benefit.  Also reviewed swallowing evaluation on June 06 2015 at [facility], there was mild oropharyngeal dysphagia, characterized by spillover to the valleculae and the piriform sinus secondary to reduced oral motor coordination  patient was felt to safe to consume a regular diet with a full range of liquid with recommendation of upright for food by mouth, and 45 minutes after meals, head flexed slightly forward , swallow at slower rate  UPDATE Oct 16th 2017: I reviewed laboratory in October 2017, immune of fixative electrophoresis showed mild elevated Ig G2.8, otherwise was  normal, mild anemia hemoglobin 11.7, mild elevated kappa.free light chain 26, mild elevated creatinine 1.38, normal ferritin 163, normal iron panel, TSH, folic acid, [id number],  Last ivig was in Sept, it has helped him 80%, he can chew and swallow, he continue complains of blurry vision,  UPDATE May 05 2016: He is overall much improved, but still complains of SOB, is at his baseline, last infusion was on  Nov 27, 28th, he will have more infusion in December, he has mild dysarthria, slight dysphagia will see hematologist Dr. Vivianne Spence January 2018,   UPDATE Oct 04 2018: Patient complains of gradual onset swallowing difficulty since April 2020, to the point he has to modify his daily diet, he can only eat soft food, also increased dyspnea upon exertion, has to cut back his daily activity, he denies double vision, no droopy eyelid, denies difficulty to close his eyes tightly, he denies significant limb muscle weakness,  REVIEW OF SYSTEMS: Full 14 system review of systems performed and notable only for those listed, all others are neg:  As above   ALLERGIES: Allergies  Allergen Reactions   Aspirin    Shellfish Allergy    Tramadol Nausea Only    HOME MEDICATIONS: Outpatient Medications Prior to Visit  Medication Sig Dispense Refill   famotidine (PEPCID) 10 MG tablet Take 10 mg by mouth 2 (two) times daily.     pyridostigmine (MESTINON) 60 MG tablet TAKE 1 TABLET FOUR TIMES DAILY 360 tablet 3   valsartan-hydrochlorothiazide (DIOVAN-HCT) 320-25 MG per tablet Take 1 tablet by mouth daily.     vitamin B-12 (CYANOCOBALAMIN) 1000 MCG tablet Take 1,000 mcg by mouth daily.      warfarin (COUMADIN) 5 MG tablet 5 mg. Taking 5mg  Mon, Wed, Fri.  Taking 2.5mg  Sun, Tues, Thurs, Sat.     No facility-administered medications prior to visit.     PAST MEDICAL HISTORY: Past Medical History:  Diagnosis Date   B12 deficiency    Essential tremor    Hypertension    Memory deficit     Myasthenia gravis ([place])    since 2004   Pulmonary emboli ([place])    in Nov 2003   Skin cancer     PAST SURGICAL HISTORY: Past Surgical History:  Procedure Laterality Date   CARPAL TUNNEL RELEASE Right 09/14/2012   Procedure: CARPAL TUNNEL RELEASE;  Surgeon: Cammie Sickle., MD;  Location: [place];  Service: Orthopedics;  Laterality: Right;   CARPAL TUNNEL RELEASE Left 11/04/2015   Procedure: LEFT CARPAL TUNNEL RELEASE, EXTENDED INCISION;  Surgeon: Daryll Brod, MD;  Location: [place];  Service: Orthopedics;  Laterality: Left;  ANESTHESIA: IV REGIONAL UPPER ARM   CHOLECYSTECTOMY  2012   [place]   COLONOSCOPY     FOOT SURGERY  1966   lt orif foot   SKIN CANCER EXCISION      FAMILY HISTORY: Family History  Problem Relation Age of Onset   Heart disease Mother    Heart disease Father     SOCIAL HISTORY: Social History   Socioeconomic History   Marital status: Married    Spouse name: [name]   Number of children: 2   Years of education: 12   Highest education level: Not on file  Occupational History   Occupation: Plant    Comment: Retired  Scientist, product/process development strain: Not on file   Food insecurity:    Worry: Not on file    Inability: Not on file   Transportation needs:    Medical: Not on file    Non-medical: Not on file  Tobacco Use   Smoking status: Former Smoker    Last attempt to quit: 05/17/1968    Years since quitting: 50.4   Smokeless tobacco: Never Used  Substance and Sexual Activity   Alcohol use: No   Drug use: No   Sexual activity: Not on file  Lifestyle  Physical activity:    Days per week: Not on file    Minutes per session: Not on file   Stress: Not on file  Relationships   Social connections:    Talks on phone: Not on file    Gets together: Not on file    Attends religious service: Not on file    Active member of club or organization: Not on file    Attends meetings of  clubs or organizations: Not on file    Relationship status: Not on file   Intimate partner violence:    Fear of current or ex partner: Not on file    Emotionally abused: Not on file    Physically abused: Not on file    Forced sexual activity: Not on file  Other Topics Concern   Not on file  Social History Narrative   He lives with his wife [name](, has 2 children, retired. Hard of hearing.   Right handed.   Caffeine- None     PHYSICAL EXAM  Vitals:   10/04/18 0754  BP: (!) 155/73  Pulse: 67  Temp: 98.6 F (37 C)  Weight: 212 lb (96.2 kg)  Height: 5\' 9"  (1.753 m)   Body mass index is 31.31 kg/m.  Generalized: Well developed, in no acute distress  Head: normocephalic and atraumatic,. Oropharynx benign   Neurological examination   Mentation: Alert oriented to time, place, history taking. Attention span and concentration appropriate. Recent and remote memory intact.  Follows all commands speech and language fluent.   Cranial nerve II-XII: Pupils were equal round reactive to light extraocular movements were full, visual field were full on confrontational test. Facial sensation and strength were normal. hearing was intact to finger rubbing bilaterally. He has moderate eye closure weakness.    head turning and shoulder shrug were normal and symmetric.Tongue protrusion into cheek strength was normal.With superior gaze for 1 minute no diplopia or ptosis noted. Motor: normal bulk and tone, full strength in the BUE, BLE,With arms abducted for 1 minute no weakness noted. Sensory: normal and symmetric to light touch,  Coordination: finger-nose-finger, heel-to-shin bilaterally, no dysmetria Reflexes: Symmetric upper and lower, plantar responses were flexor bilaterally. Gait and Station: Rising up from seated position arm crossed, steady  DIAGNOSTIC DATA (LABS, IMAGING, TESTING) - I reviewed patient records, labs, notes, testing and imaging myself where available.  Lab Results   Component Value Date   WBC 4.9 05/24/2018   HGB 14.8 05/24/2018   HCT 43.5 05/24/2018   MCV 91 05/24/2018   PLT 192 05/24/2018      Component Value Date/Time   NA 141 05/24/2018 1354   K 4.9 05/24/2018 1354   CL 99 05/24/2018 1354   CO2 27 05/24/2018 1354   GLUCOSE 160 (H) 05/24/2018 1354   GLUCOSE 134 (H) 10/25/2016 0951   BUN 17 05/24/2018 1354   CREATININE 1.60 (H) 05/24/2018 1354   CALCIUM 9.6 05/24/2018 1354   PROT 6.5 05/24/2018 1354   ALBUMIN 4.4 05/24/2018 1354   AST 25 05/24/2018 1354   ALT 21 05/24/2018 1354   ALKPHOS 56 05/24/2018 1354   BILITOT 0.6 05/24/2018 1354   GFRNONAA 40 (L) 05/24/2018 1354   GFRAA 46 (L) 05/24/2018 1354    Lab Results  Component Value Date   VITAMINB12 1,480 (H) 01/21/2016   Lab Results  Component Value Date   TSH 2.010 08/25/2016   ASSESSMENT AND PLAN Seropositive myasthenia gravis  Not a good candidate for long-term immunosuppressive treatment due to recurrent  large area of skin cancer,  Responded very well to previous IVIG treatment, loading dose was in September 2017, initial dose 2 g/kg then every 4 weeks 1 g/kg, last infusion was in May 12 2016.  Recurrent swallowing difficulty since end of April 2020, who treated with prednisone 20 mg daily for 2 weeks, then 10 mg every day for 2 weeks, continue Mestinon 60 mg 3 times a day,  Will reinitiate IVIG treatment, loading dose 2 g/kg, followed by 1 g/kg every 28 days for total of 6 treatment,  Because of his shortness of breath, will divided the infusion of 2 g/kg into 4 days   Baseline shortness of breath  Due to previous PE, on chronic Coumadin treatment, previous pulmonary functional tests showed evidence of small airway constriction   Marcial Pacas, M.D. Ph.D.  [facility] [place], [place] [address] Phone: [phone] Fax:      [phone]

## 2018-10-12 ENCOUNTER — Telehealth: Payer: Self-pay | Admitting: Neurology

## 2018-10-12 NOTE — Telephone Encounter (Signed)
Per Calvin Gunning, RN with [facility], IVIG has been approved by his insurance plan.  They are being required to use [facility] specialty pharmacy.  The pharmacy needed prescription clarification and this information was provided to them on 10/10/2018.  Calvin Sloan informed me that the pharmacy has the prescription and they are waiting for it to be processed in order to set up delivery.  She said [facility] will call them back again tomorrow to check the status.  I returned the call to Calvin Sloan and updated her on the progress.

## 2018-10-12 NOTE — Telephone Encounter (Signed)
Pt's wife called wanting to know when her husband's [facility] treatments will start. Please advise.

## 2018-10-12 NOTE — Telephone Encounter (Signed)
This message has been provided to [facility].  I will follow up with the patient and his wife.

## 2018-10-23 DIAGNOSIS — G7001 Myasthenia gravis with (acute) exacerbation: Secondary | ICD-10-CM | POA: Diagnosis not present

## 2018-10-24 DIAGNOSIS — G7001 Myasthenia gravis with (acute) exacerbation: Secondary | ICD-10-CM | POA: Diagnosis not present

## 2018-10-25 DIAGNOSIS — G7001 Myasthenia gravis with (acute) exacerbation: Secondary | ICD-10-CM | POA: Diagnosis not present

## 2018-10-26 DIAGNOSIS — G7001 Myasthenia gravis with (acute) exacerbation: Secondary | ICD-10-CM | POA: Diagnosis not present

## 2018-10-27 DIAGNOSIS — I2699 Other pulmonary embolism without acute cor pulmonale: Secondary | ICD-10-CM | POA: Diagnosis not present

## 2018-11-03 DIAGNOSIS — I2699 Other pulmonary embolism without acute cor pulmonale: Secondary | ICD-10-CM | POA: Diagnosis not present

## 2018-11-20 ENCOUNTER — Encounter: Payer: Self-pay | Admitting: *Deleted

## 2018-11-20 DIAGNOSIS — G7001 Myasthenia gravis with (acute) exacerbation: Secondary | ICD-10-CM | POA: Diagnosis not present

## 2018-11-21 DIAGNOSIS — G7001 Myasthenia gravis with (acute) exacerbation: Secondary | ICD-10-CM | POA: Diagnosis not present

## 2018-11-22 ENCOUNTER — Ambulatory Visit: Payer: Medicare Other | Admitting: Neurology

## 2018-11-22 DIAGNOSIS — G7001 Myasthenia gravis with (acute) exacerbation: Secondary | ICD-10-CM | POA: Diagnosis not present

## 2018-11-23 DIAGNOSIS — G7001 Myasthenia gravis with (acute) exacerbation: Secondary | ICD-10-CM | POA: Diagnosis not present

## 2018-11-28 DIAGNOSIS — Z86711 Personal history of pulmonary embolism: Secondary | ICD-10-CM | POA: Diagnosis not present

## 2018-12-06 DIAGNOSIS — C44329 Squamous cell carcinoma of skin of other parts of face: Secondary | ICD-10-CM | POA: Diagnosis not present

## 2018-12-13 ENCOUNTER — Other Ambulatory Visit: Payer: Self-pay

## 2018-12-13 ENCOUNTER — Encounter: Payer: Self-pay | Admitting: Neurology

## 2018-12-13 ENCOUNTER — Ambulatory Visit (INDEPENDENT_AMBULATORY_CARE_PROVIDER_SITE_OTHER): Payer: Medicare Other | Admitting: Neurology

## 2018-12-13 VITALS — BP 138/75 | HR 60 | Temp 99.5°F | Ht 69.0 in | Wt 209.5 lb

## 2018-12-13 DIAGNOSIS — G7001 Myasthenia gravis with (acute) exacerbation: Secondary | ICD-10-CM

## 2018-12-13 DIAGNOSIS — M1732 Unilateral post-traumatic osteoarthritis, left knee: Secondary | ICD-10-CM | POA: Diagnosis not present

## 2018-12-13 NOTE — Progress Notes (Signed)
[facility]  PATIENT: Calvin Sloan DOB: 03-30-1934   REASON FOR VISIT: follow up Tees Toh and wife    HISTORY OF PRESENT ILLNESS: HISTORY 02/18/15 Calvin Sloan): Calvin Sloan is a 85 -year-old right-handed white married male with generalized seropositive myasthenia gravis characterized by bulbar weakness, shortness of breath , and dysphagia. Patient of [name]  He was diagnosed in 10/2002 with a positive Tensilon test, positive acetylchoine receptor antibody,and had a positive ANA at 1-160 speckled pattern. Initially he had right eye ptosis and subsequently, double vision, fatigue with chewing, dysphagia, and upper and lower extremity weakness. He was initially treated with Mestinon bromide in June 2004, which was associated with GI side effects.  Prednisone was started in 03/29/2003 and CellCept 05/2003. He developed increased jitteriness, rash, and cramps on CellCept which was discontinued 10/2003. He had progressive bulbar weakness and was admitted 12/2003 for IV IgG therapy which required a PEG tube for dysphagia transiently. He was started on Imuran. 01/17/2004 he was readmitted for 2 days of IV IgG because of shortness of breath. He has not been hospitalized since 01/2004.   He has been slowly tapered off prednisone 09/2007 and was on Imuran and Mestinon bromide.   He had B12 deficiency and was receiving B12 shots ,but B12 levels off injections have been normal and B12 shots were discontinued.  He has a history of vague visual loss on the left of unknown etiology. He denies swallowing problems, speech problems, voice changes, focal weakness, or double vision.  He is independent in activities of daily living and takes one nap per day.He denies memory loss but his wife states that he does have memory loss. His last blood studies in my office 05/27/10 were normal except a low white blood cell count of 3900.  His dermatologist [name]  was concerned that the Imuran is associated with increased risk of skin cancer and the imuran was discontinued in 12/30/2010. His skin cancer (basal cell cancer) has improved since his imuran was stopped.  He has noted no change In myasthenic symptoms. His skin has improved. His medication for myasthenia is mesinon bromide 60 milligrams one and one half tablets 4 times a day. He has shortness of breath since his pulmonary emboli in 2003, also complains of excessive fatigue.   He is allergic to aspirin, GI side effect, gastric ulcer, and is on Plavix for a history of TIAs with right arm weakness He has swallowing problems at the beginning of the meal with liquids that improves as the meal continues.   He had right CTS release in May 2014 by [name].  He complains of shortness of breath with walking, bending over to tire his shoed, due to PE 2003, pulmnolgist folllowed up, he does not like to chew on tough beef, he denies swallowing difficulty, no double vision, no droopy eyelid, no significant bleeding muscle weakness. He is active in yard, shop, threadmill   UPDATE Jan 16th 2015: He is now tapering off Mestinon, there was no significant change in his functional status, he denies double vision, no ptosis, continue complains of shortness of breath with exertion no significant and muscle weakness, he has to wash down his food sometimes, but no significant swelling, or chewing difficulty, no dysarthria  UPDATE July 16th 2015: He is no longer mestinon, he does complains of increased breathing difficulty from prolonged walking, no chewing difficulty, no double vision, no ptosis.  Laboratory continue to demonstrate positive acetylcholine  binding, and modulating antibodies. He did reported that his skin cancer has improved after stopping imuran, could not tolerate cellcept per record. Previously he responded to IVIG very well.  UPDATE Sept 25th 2015: He was stated on prednisone 10mg  qday,  and mestinon 60mg  tid in June 2015, which did help his fatigue, shortness of breath, wife reported 50% improvement, he has increased appetite  But he was admitted to [facility] hospital in Sept 17th for bilateral PEs, right leg DVT, he is started on coumadin, bridge with Lovenox, [name] is monitoring his INR  He denies double vision, no ptosis, no limb muscle weakness, no dysphagia  UPDATE April 4th 2016: He is on coumadin for PE, INR was monitored by [name], his myasthenia gravis is under good control, is taking prednisone 10 mg every day, he has surgery in Nov 2015 for right leg cellulitis.  He only does little around his house, he has no double vision, SOB due to previous PE,   UPDATE Feb 18 2015: Previous laboratory evaluation showed mild B12 deficiency with level of 204, He did receive IM B12 supplement at his primary care Dr. [name] office for a while, now with normal repeat laboratory evaluation, injection has stopped He has mild choking difficulty, this has been going on since August 2016, not getting worse, he has more trouble with liquid. He complains of shortness of breath when bending over. He denies significant gait difficulty, no breathing difficulty otherwise, he denies double vision, no ptosis,, No chewing difficulties   UPDATE May 22 2015: He came in last visit April 16 2015 complains of worsening blurry vision, double vision, swallowing difficulties, trouble talking, he reported 50% weakness compared to baseline, difficulty getting up from seated position, difficulty chewing, worsening shortness of breath, on examination, he was noted to have increased weakness compared to previous examination, he had moderate eye-closure cheek puff, mild neck flexion weakness, mild bilateral shoulder abduction bilateral hip flexion weakness.  He was treated with IVIG 400 mg/kg every day for 5 days from December 11 to May 02 2015, his vision has much improved, he can walk  better, but he continue have swallowing difficulty, dysarthria, he is also taking prednisone 40 mg every day, increase his Mestinon 60 mg to 3-4 tablets daily  He was not able to tolerate immunosuppression treatment in the past due to skin cancer  UPDATE Jul 07 2015:  I have called his wife, pulmonary function test in June 05 2015.  1, FVC is 3.1 5, 87%, this is normal 2. FEV1 is 2.1 6, 77%, with a percent FEV1 of 68, this indicated mild to moderate primary small obstruction in this patient was a history of cigarette smoking, large elderly flow rates are generally normal  3. Flow volume loop is unremarkable 4, lung volumes indicate some evidence of air trapping with an increased residual volume, the TLC is normal, the increased residual volume confirms the presence of airflow obstruction  5, DLCO is 19.0, 90%, this is normal 6, elderly resistant 3.7 2, 265%, this is increased  Wife reported, he recently had swallowing study, there was no significant abnormality found, his swallowing, breathing, symptoms overall has improved  He did think that IVIG treatment in Dec 11-16th 2016 has helped his blurry vision, it is gone, he still feel straggled sometimes, he has shortness with exertion and bending over. His talking is much better.  UPDATE May 22nd 2017: He continue complains of shortness of breath with minimum exertion, no diplopia, no double vision,  mild dysphasia,  He had a history of right carpal tunnel release surgery in the past, which has been helpful, now complains of left first 3 finger paresthesia, EMG nerve conduction studies was planned at local hospital  UPDATE July 6th 2017: He stopped taking Mestinon around May 20 third 2017, shortly afterwards, he noticed double vision, he clearly describe eighth of binocular double vision, sometimes vertigo, sometimes horizontal, he went on higher dose of Mestinon 60 mg 4 times a day without improving his symptoms, he slept on a  bed with 30 head raise, with no significant breathing difficulty, he could not eat his dinner on July 5th 2017 due to chewing swallowing difficulty, he could not fly flat sleeping, mildly unsteady gait, increased fatigue,  UPDATE August 17th 2017: Last IVIG treatment was December 05 2015, he did very well for a while, was able to cut grass, trim his lawn, it did helped him for at least 2 weeks, then he had passing out   I reviewed laboratory evaluation in August 2017, normal B12, ferritin level, iron binding capacity was within normal limit, low hemoglobin 10 point 8, creatinine was elevated 1.53, glucose was 125  Previous laboratory evaluation in 2016 showed normal folic acid 12 point 5,low normal B12 level 266, hemoglobin in January 2015 was 14 point 3  He will be evaluated by hematologist/oncologist Dr. Mervin Kung in September 6th 2017, at [facility]  He complains of low back pain since he fell you August 1st 2017, has been taking intermittent Tylenol,  He complains of difficulty swallowing, both liquid, and solid food, no double vision, He is taking mestinon 60mg  4 times a day, which did help him.  Update January 29 2016: I have reviewed his hematologist Dr. Grier Mitts note on September 6th 2017, normocytic microchromic anemia, normal WBC, platelet count, likely from chronic kidney disease, history of fetal deficiency, is on B12 supplement, there was a consideration of low level hemolytic anemia from IVIG infusion, no overt evidence of blood loss  I reviewed laboratory evaluation in September 2017, parietal cell antibody IgG was negative, intrinsic factor negative, mild elevated kappa free light chain, Hg 13.8, creat 1.38, GFR 47, ferritin 163, normal TSh 1.5, elevated vitamin B12 1480  He complains more fatigue, more swallowing difficulty, decreased po intake, he could not drinking water because worry about the choking episode, increased shortness of breath with exertion,  intermittent blurry version, difficulty closing his eyes.  He is now taking mestinon 60mg  qid, no significant side effect, was not sure about the benefit.  Also reviewed swallowing evaluation on June 06 2015 at [facility], there was mild oropharyngeal dysphagia, characterized by spillover to the valleculae and the piriform sinus secondary to reduced oral motor coordination  patient was felt to safe to consume a regular diet with a full range of liquid with recommendation of upright for food by mouth, and 45 minutes after meals, head flexed slightly forward , swallow at slower rate  UPDATE Oct 16th 2017: I reviewed laboratory in October 2017, immune of fixative electrophoresis showed mild elevated Ig G2.8, otherwise was normal, mild anemia hemoglobin 11.7, mild elevated kappa.free light chain 26, mild elevated creatinine 1.38, normal ferritin 163, normal iron panel, TSH, folic acid, T01 6010,  Last ivig was in Sept, it has helped him 80%, he can chew and swallow, he continue complains of blurry vision,  UPDATE May 05 2016: He is overall much improved, but still complains of SOB, is at his baseline, last infusion was  on Nov 27, 28th, he will have more infusion in December, he has mild dysarthria, slight dysphagia will see hematologist Dr. Vivianne Spence January 2018,    HISTORY OF PRESENT ILLNESS: HISTORY 02/18/15 Calvin Sloan): JOZIYAH ROBLERO is a 40 -year-old right-handed white married male with generalized seropositive myasthenia gravis characterized by bulbar weakness, shortness of breath , and dysphagia. Patient of [name]  He was diagnosed in 10/2002 with a positive Tensilon test, positive acetylchoine receptor antibody,and had a positive ANA at 1-160 speckled pattern. Initially he had right eye ptosis and subsequently, double vision, fatigue with chewing, dysphagia, and upper and lower extremity weakness. He was initially treated with Mestinon bromide in June 2004, which  was associated with GI side effects.  Prednisone was started in 03/29/2003 and CellCept 05/2003. He developed increased jitteriness, rash, and cramps on CellCept which was discontinued 10/2003. He had progressive bulbar weakness and was admitted 12/2003 for IV IgG therapy which required a PEG tube for dysphagia transiently. He was started on Imuran. 01/17/2004 he was readmitted for 2 days of IV IgG because of shortness of breath. He has not been hospitalized since 01/2004.   He has been slowly tapered off prednisone 09/2007 and was on Imuran and Mestinon bromide.   He had B12 deficiency and was receiving B12 shots ,but B12 levels off injections have been normal and B12 shots were discontinued.  He has a history of vague visual loss on the left of unknown etiology. He denies swallowing problems, speech problems, voice changes, focal weakness, or double vision.  He is independent in activities of daily living and takes one nap per day.He denies memory loss but his wife states that he does have memory loss. His last blood studies in my office 05/27/10 were normal except a low white blood cell count of 3900.  His dermatologist [name] was concerned that the Imuran is associated with increased risk of skin cancer and the imuran was discontinued in 12/30/2010. His skin cancer (basal cell cancer) has improved since his imuran was stopped.  He has noted no change In myasthenic symptoms. His skin has improved. His medication for myasthenia is mesinon bromide 60 milligrams one and one half tablets 4 times a day. He has shortness of breath since his pulmonary emboli in 2003, also complains of excessive fatigue.   He is allergic to aspirin, GI side effect, gastric ulcer, and is on Plavix for a history of TIAs with right arm weakness He has swallowing problems at the beginning of the meal with liquids that improves as the meal continues.   He had right CTS release in May 2014 by Dr.  [name].  He complains of shortness of breath with walking, bending over to tire his shoed, due to PE 2003, pulmnolgist folllowed up, he does not like to chew on tough beef, he denies swallowing difficulty, no double vision, no droopy eyelid, no significant bleeding muscle weakness. He is active in yard, shop, threadmill   UPDATE Jan 16th 2015: He is now tapering off Mestinon, there was no significant change in his functional status, he denies double vision, no ptosis, continue complains of shortness of breath with exertion no significant and muscle weakness, he has to wash down his food sometimes, but no significant swelling, or chewing difficulty, no dysarthria  UPDATE July 16th 2015: He is no longer mestinon, he does complains of increased breathing difficulty from prolonged walking, no chewing difficulty, no double vision, no ptosis.  Laboratory continue to demonstrate positive acetylcholine binding,  and modulating antibodies. He did reported that his skin cancer has improved after stopping imuran, could not tolerate cellcept per record. Previously he responded to IVIG very well.  UPDATE Sept 25th 2015: He was stated on prednisone 10mg  qday, and mestinon 60mg  tid in June 2015, which did help his fatigue, shortness of breath, wife reported 50% improvement, he has increased appetite  But he was admitted to [facility] hospital in Sept 17th for bilateral PEs, right leg DVT, he is started on coumadin, bridge with Lovenox, [name] is monitoring his INR  He denies double vision, no ptosis, no limb muscle weakness, no dysphagia  UPDATE April 4th 2016: He is on coumadin for PE, INR was monitored by [name], his myasthenia gravis is under good control, is taking prednisone 10 mg every day, he has surgery in Nov 2015 for right leg cellulitis.  He only does little around his house, he has no double vision, SOB due to previous PE,   UPDATE Feb 18 2015: Previous laboratory evaluation  showed mild B12 deficiency with level of 204, He did receive IM B12 supplement at his primary care Dr. [name] office for a while, now with normal repeat laboratory evaluation, injection has stopped He has mild choking difficulty, this has been going on since August 2016, not getting worse, he has more trouble with liquid. He complains of shortness of breath when bending over. He denies significant gait difficulty, no breathing difficulty otherwise, he denies double vision, no ptosis,, No chewing difficulties   UPDATE May 22 2015: He came in last visit April 16 2015 complains of worsening blurry vision, double vision, swallowing difficulties, trouble talking, he reported 50% weakness compared to baseline, difficulty getting up from seated position, difficulty chewing, worsening shortness of breath, on examination, he was noted to have increased weakness compared to previous examination, he had moderate eye-closure cheek puff, mild neck flexion weakness, mild bilateral shoulder abduction bilateral hip flexion weakness.  He was treated with IVIG 400 mg/kg every day for 5 days from December 11 to May 02 2015, his vision has much improved, he can walk better, but he continue have swallowing difficulty, dysarthria, he is also taking prednisone 40 mg every day, increase his Mestinon 60 mg to 3-4 tablets daily  He was not able to tolerate immunosuppression treatment in the past due to skin cancer  UPDATE Jul 07 2015:  I have called his wife, pulmonary function test in June 05 2015.  1, FVC is 3.1 5, 87%, this is normal 2. FEV1 is 2.1 6, 77%, with a percent FEV1 of 68, this indicated mild to moderate primary small obstruction in this patient was a history of cigarette smoking, large elderly flow rates are generally normal  3. Flow volume loop is unremarkable 4, lung volumes indicate some evidence of air trapping with an increased residual volume, the TLC is normal, the increased  residual volume confirms the presence of airflow obstruction  5, DLCO is 19.0, 90%, this is normal 6, elderly resistant 3.7 2, 265%, this is increased  Wife reported, he recently had swallowing study, there was no significant abnormality found, his swallowing, breathing, symptoms overall has improved  He did think that IVIG treatment in Dec 11-16th 2016 has helped his blurry vision, it is gone, he still feel straggled sometimes, he has shortness with exertion and bending over. His talking is much better.  UPDATE May 22nd 2017: He continue complains of shortness of breath with minimum exertion, no diplopia, no double vision, mild  dysphasia,  He had a history of right carpal tunnel release surgery in the past, which has been helpful, now complains of left first 3 finger paresthesia, EMG nerve conduction studies was planned at local hospital  UPDATE July 6th 2017: He stopped taking Mestinon around May 20 third 2017, shortly afterwards, he noticed double vision, he clearly describe eighth of binocular double vision, sometimes vertigo, sometimes horizontal, he went on higher dose of Mestinon 60 mg 4 times a day without improving his symptoms, he slept on a bed with 30 head raise, with no significant breathing difficulty, he could not eat his dinner on July 5th 2017 due to chewing swallowing difficulty, he could not fly flat sleeping, mildly unsteady gait, increased fatigue,  UPDATE August 17th 2017: Last IVIG treatment was December 05 2015, he did very well for a while, was able to cut grass, trim his lawn, it did helped him for at least 2 weeks, then he had passing out   I reviewed laboratory evaluation in August 2017, normal B12, ferritin level, iron binding capacity was within normal limit, low hemoglobin 10 point 8, creatinine was elevated 1.53, glucose was 125  Previous laboratory evaluation in 2016 showed normal folic acid 12 point 5,low normal B12 level 266, hemoglobin in January 2015  was 14 point 3  He will be evaluated by hematologist/oncologist Dr. Mervin Kung in September 6th 2017, at [facility]  He complains of low back pain since he fell you August 1st 2017, has been taking intermittent Tylenol,  He complains of difficulty swallowing, both liquid, and solid food, no double vision, He is taking mestinon 60mg  4 times a day, which did help him.  Update January 29 2016: I have reviewed his hematologist Dr. Grier Mitts note on September 6th 2017, normocytic microchromic anemia, normal WBC, platelet count, likely from chronic kidney disease, history of fetal deficiency, is on B12 supplement, there was a consideration of low level hemolytic anemia from IVIG infusion, no overt evidence of blood loss  I reviewed laboratory evaluation in September 2017, parietal cell antibody IgG was negative, intrinsic factor negative, mild elevated kappa free light chain, Hg 13.8, creat 1.38, GFR 47, ferritin 163, normal TSh 1.5, elevated vitamin B12 1480  He complains more fatigue, more swallowing difficulty, decreased po intake, he could not drinking water because worry about the choking episode, increased shortness of breath with exertion, intermittent blurry version, difficulty closing his eyes.  He is now taking mestinon 60mg  qid, no significant side effect, was not sure about the benefit.  Also reviewed swallowing evaluation on June 06 2015 at [facility], there was mild oropharyngeal dysphagia, characterized by spillover to the valleculae and the piriform sinus secondary to reduced oral motor coordination  patient was felt to safe to consume a regular diet with a full range of liquid with recommendation of upright for food by mouth, and 45 minutes after meals, head flexed slightly forward , swallow at slower rate  UPDATE Oct 16th 2017: I reviewed laboratory in October 2017, immune of fixative electrophoresis showed mild elevated Ig G2.8, otherwise was  normal, mild anemia hemoglobin 11.7, mild elevated kappa.free light chain 26, mild elevated creatinine 1.38, normal ferritin 163, normal iron panel, TSH, folic acid, T70 1779,  Last ivig was in Sept, it has helped him 80%, he can chew and swallow, he continue complains of blurry vision,  UPDATE May 05 2016: He is overall much improved, but still complains of SOB, is at his baseline, last infusion was on  Nov 27, 28th, he will have more infusion in December, he has mild dysarthria, slight dysphagia will see hematologist Dr. Vivianne Spence January 2018,   UPDATE Oct 04 2018: Patient complains of gradual onset swallowing difficulty since April 2020, to the point he has to modify his daily diet, he can only eat soft food, also increased dyspnea upon exertion, has to cut back his daily activity, he denies double vision, no droopy eyelid, denies difficulty to close his eyes tightly, he denies significant limb muscle weakness,  Update December 13, 2018:  He is accompanied by his wife at today's clinic visit, he had received IVIG treatment in June, July, planning on receiving it again on August 3 to 10/2018, he tolerated infusion well, noticed moderate to significant improvement of his muscle weakness, 80% back to his baseline, he can swallowing better, move better, but continue noticed mild bilateral upper extremity weakness, taking Mestinon 4 times a day, prednisone 10 mg daily    REVIEW OF SYSTEMS: Full 14 system review of systems performed and notable only for those listed, all others are neg: As above As above   ALLERGIES: Allergies  Allergen Reactions   Aspirin    Shellfish Allergy    Tramadol Nausea Only    HOME MEDICATIONS: Outpatient Medications Prior to Visit  Medication Sig Dispense Refill   famotidine (PEPCID) 10 MG tablet Take 10 mg by mouth 2 (two) times daily.     predniSONE (DELTASONE) 10 MG tablet Take 2 tablets (20 mg total) by mouth daily. 60 tablet 3   pyridostigmine  (MESTINON) 60 MG tablet TAKE 1 TABLET FOUR TIMES DAILY 360 tablet 3   valsartan-hydrochlorothiazide (DIOVAN-HCT) 320-25 MG per tablet Take 1 tablet by mouth daily.     vitamin B-12 (CYANOCOBALAMIN) 1000 MCG tablet Take 1,000 mcg by mouth daily.      warfarin (COUMADIN) 5 MG tablet 5 mg. Taking 5mg  Mon, Wed, Fri.  Taking 2.5mg  Sun, Tues, Thurs, Sat.     No facility-administered medications prior to visit.     PAST MEDICAL HISTORY: Past Medical History:  Diagnosis Date   B12 deficiency    Essential tremor    Hypertension    Memory deficit    Myasthenia gravis ([place])    since 2004   Pulmonary emboli ([place])    in Nov 2003   Skin cancer     PAST SURGICAL HISTORY: Past Surgical History:  Procedure Laterality Date   CARPAL TUNNEL RELEASE Right 09/14/2012   Procedure: CARPAL TUNNEL RELEASE;  Surgeon: Cammie Sickle., MD;  Location: [facility];  Service: Orthopedics;  Laterality: Right;   CARPAL TUNNEL RELEASE Left 11/04/2015   Procedure: LEFT CARPAL TUNNEL RELEASE, EXTENDED INCISION;  Surgeon: Daryll Brod, MD;  Location: [facility];  Service: Orthopedics;  Laterality: Left;  ANESTHESIA: IV REGIONAL UPPER ARM   CHOLECYSTECTOMY  2012   [facility]   COLONOSCOPY     FOOT SURGERY  1966   lt orif foot   SKIN CANCER EXCISION      FAMILY HISTORY: Family History  Problem Relation Age of Onset   Heart disease Mother    Heart disease Father     SOCIAL HISTORY: Social History   Socioeconomic History   Marital status: Married    Spouse name: [name]   Number of children: 2   Years of education: 12   Highest education level: Not on file  Occupational History   Occupation: Plant    Comment: Retired  Scientific laboratory technician  Financial resource strain: Not on file   Food insecurity    Worry: Not on file    Inability: Not on file   Transportation needs    Medical: Not on file    Non-medical: Not on file  Tobacco Use   Smoking status:  Former Smoker    Quit date: 05/17/1968    Years since quitting: 50.6   Smokeless tobacco: Never Used  Substance and Sexual Activity   Alcohol use: No   Drug use: No   Sexual activity: Not on file  Lifestyle   Physical activity    Days per week: Not on file    Minutes per session: Not on file   Stress: Not on file  Relationships   Social connections    Talks on phone: Not on file    Gets together: Not on file    Attends religious service: Not on file    Active member of club or organization: Not on file    Attends meetings of clubs or organizations: Not on file    Relationship status: Not on file   Intimate partner violence    Fear of current or ex partner: Not on file    Emotionally abused: Not on file    Physically abused: Not on file    Forced sexual activity: Not on file  Other Topics Concern   Not on file  Social History Narrative   He lives with his wife [name](, has 2 children, retired. Hard of hearing.   Right handed.   Caffeine- None     PHYSICAL EXAM  Vitals:   12/13/18 1432  BP: 138/75  Pulse: 60  Temp: 99.5 F (37.5 C)  Weight: 209 lb 8 oz (95 kg)  Height: 5\' 9"  (1.753 m)   Body mass index is 30.94 kg/m.  Generalized: Well developed, in no acute distress  Head: normocephalic and atraumatic,. Oropharynx benign   Neurological examination   Mentation: Alert oriented to time, place, history taking. Attention span and concentration appropriate. Recent and remote memory intact.  Follows all commands speech and language fluent.   Cranial nerve II-XII: Pupils were equal round reactive to light extraocular movements were full, visual field were full on confrontational test. Facial sensation and strength were normal. hearing was intact to finger rubbing bilaterally. He has moderate eye closure and cheek puff weakness.    head turning and shoulder shrug were normal and symmetric.Tongue protrusion into cheek strength was normal.  Motor: No significant  neck flexion weakness, mild bilateral shoulder abduction weakness. Sensory: normal and symmetric to light touch,  Coordination: finger-nose-finger, heel-to-shin bilaterally, no dysmetria Reflexes: Symmetric upper and lower, plantar responses were flexor bilaterally. Gait and Station: Rising up from seated position arm crossed, steady  DIAGNOSTIC DATA (LABS, IMAGING, TESTING) - I reviewed patient records, labs, notes, testing and imaging myself where available.  Lab Results  Component Value Date   WBC 4.9 05/24/2018   HGB 14.8 05/24/2018   HCT 43.5 05/24/2018   MCV 91 05/24/2018   PLT 192 05/24/2018      Component Value Date/Time   NA 141 05/24/2018 1354   K 4.9 05/24/2018 1354   CL 99 05/24/2018 1354   CO2 27 05/24/2018 1354   GLUCOSE 160 (H) 05/24/2018 1354   GLUCOSE 134 (H) 10/25/2016 0951   BUN 17 05/24/2018 1354   CREATININE 1.60 (H) 05/24/2018 1354   CALCIUM 9.6 05/24/2018 1354   PROT 6.5 05/24/2018 1354   ALBUMIN 4.4 05/24/2018 1354   AST  25 05/24/2018 1354   ALT 21 05/24/2018 1354   ALKPHOS 56 05/24/2018 1354   BILITOT 0.6 05/24/2018 1354   GFRNONAA 40 (L) 05/24/2018 1354   GFRAA 46 (L) 05/24/2018 1354    Lab Results  Component Value Date   VITAMINB12 1,480 (H) 01/21/2016   Lab Results  Component Value Date   TSH 2.010 08/25/2016   ASSESSMENT AND PLAN Seropositive myasthenia gravis  Not a good candidate for long-term immunosuppressive treatment due to recurrent large area of skin cancer,  Responded very well to previous IVIG treatment, loading dose was in September 2017, initial dose 2 g/kg then every 4 weeks 1 g/kg, last infusion was in May 12 2016.  Recurrent swallowing difficulty since end of April 2020, who treated with prednisone 20 mg daily for 2 weeks, then 10 mg every daily, continue Mestinon 60 mg 3-4 times a day,  Will reinitiate IVIG treatment, loading dose 2 g/kg, followed by 1 g/kg every 28 days for total of 6 treatment (restarted since June  2020)  Because of his shortness of breath, will divided the infusion of 2 g/kg into 4 days   After discuss with patient and his wife, will try Soliris treatment, get previous vaccination record, he needs meningococcus vaccination     Baseline shortness of breath  Due to previous PE, on chronic Coumadin treatment, previous pulmonary functional tests showed evidence of small airway constriction   Marcial Pacas, M.D. Ph.D.  [facility] [place], [place] [address] Phone: [phone] Fax:      [phone]

## 2018-12-14 ENCOUNTER — Telehealth: Payer: Self-pay | Admitting: *Deleted

## 2018-12-14 NOTE — Telephone Encounter (Signed)
Levada Dy returned my call (nurse at [facility]) who will speak to one of the providers there to make sure he can come in for the injections.  She will follow back up to let me know.

## 2018-12-14 NOTE — Telephone Encounter (Signed)
Dr. [name] is starting this patient on Soliris.  He will need to have the MenACWY and MenB vaccinations prior to starting the medication.  We received his vaccination records from his PCP, Dr. [name].  The patient has not had either injection.  I have called Dr. [name] office [phone]) and requested a call back from his nurse.  We need to arrange for him to receive both vaccinations.

## 2018-12-14 NOTE — Telephone Encounter (Addendum)
Levada Dy called back again.  They are able to administer the injections to him at the office.  She has them approved through his insurance plan.  He will have a co-pay of $60.90 for the MenACWY and $78.23 for the MenB.  She is going to call him and get him scheduled.  She will fax his updated vaccination, once he gets the injections.  She was provided with our fax [phone] and will send it to Dr. Rhea Belton attention.    Once the updated vaccination record is received, the Soliris start form can be faxed in and then provide to [employer] to start the prior authorization process.  They will contact him to schedule the infusion, once scheduled. He has to have the injections two weeks prior to his first infusion.

## 2018-12-15 DIAGNOSIS — Z23 Encounter for immunization: Secondary | ICD-10-CM | POA: Diagnosis not present

## 2018-12-18 DIAGNOSIS — G7001 Myasthenia gravis with (acute) exacerbation: Secondary | ICD-10-CM | POA: Diagnosis not present

## 2018-12-19 DIAGNOSIS — G7001 Myasthenia gravis with (acute) exacerbation: Secondary | ICD-10-CM | POA: Diagnosis not present

## 2018-12-19 NOTE — Telephone Encounter (Signed)
Called the patient, Dr [name] received records stating the patient received the first dose of the meningococcal vaccine. I called the patient to see if he has the date he had gotten the fist dose, and the date he is scheduled to get the 2nd dose. Once I have this information we can get him set up for infusion. There was no answer. LVM asking for this information and for them to call back with it.   If patient calls back please have patient advise on the date he received the 1st dose of the vaccine and the date he is scheduled to receive the 2nd dose. thanks

## 2018-12-19 NOTE — Telephone Encounter (Signed)
Received immunization record and reported patient received the dose on 12/15/2018. I have wrote this information on the form and faxed the form to [facility]. Received confirmation it went through. Gave the forms to [employer] as well and made copy for michelle's records. Also made a copy to be scanned into records on in epic for the pt

## 2018-12-28 DIAGNOSIS — C44529 Squamous cell carcinoma of skin of other part of trunk: Secondary | ICD-10-CM | POA: Diagnosis not present

## 2018-12-29 DIAGNOSIS — I82401 Acute embolism and thrombosis of unspecified deep veins of right lower extremity: Secondary | ICD-10-CM | POA: Diagnosis not present

## 2019-01-02 NOTE — Telephone Encounter (Signed)
Romero Liner from Human resources [employer]) is asking for a call from [place] to discuss the need for an [place] enrollment form, the one initially received was [place]

## 2019-01-02 NOTE — Telephone Encounter (Signed)
I returned the call to Romero Liner.  She was notified that the patient has changed his mind on moving forward with Soliris and his start form should be voided.

## 2019-01-04 ENCOUNTER — Ambulatory Visit: Payer: Medicare Other | Admitting: Neurology

## 2019-01-15 DIAGNOSIS — G7001 Myasthenia gravis with (acute) exacerbation: Secondary | ICD-10-CM | POA: Diagnosis not present

## 2019-01-16 DIAGNOSIS — G7001 Myasthenia gravis with (acute) exacerbation: Secondary | ICD-10-CM | POA: Diagnosis not present

## 2019-02-09 DIAGNOSIS — I82401 Acute embolism and thrombosis of unspecified deep veins of right lower extremity: Secondary | ICD-10-CM | POA: Diagnosis not present

## 2019-02-12 DIAGNOSIS — G7001 Myasthenia gravis with (acute) exacerbation: Secondary | ICD-10-CM | POA: Diagnosis not present

## 2019-02-13 DIAGNOSIS — G7001 Myasthenia gravis with (acute) exacerbation: Secondary | ICD-10-CM | POA: Diagnosis not present

## 2019-02-16 DIAGNOSIS — L820 Inflamed seborrheic keratosis: Secondary | ICD-10-CM | POA: Diagnosis not present

## 2019-02-16 DIAGNOSIS — C44612 Basal cell carcinoma of skin of right upper limb, including shoulder: Secondary | ICD-10-CM | POA: Diagnosis not present

## 2019-02-16 DIAGNOSIS — L570 Actinic keratosis: Secondary | ICD-10-CM | POA: Diagnosis not present

## 2019-02-28 DIAGNOSIS — C44612 Basal cell carcinoma of skin of right upper limb, including shoulder: Secondary | ICD-10-CM | POA: Diagnosis not present

## 2019-03-15 DIAGNOSIS — M1732 Unilateral post-traumatic osteoarthritis, left knee: Secondary | ICD-10-CM | POA: Diagnosis not present

## 2019-03-15 DIAGNOSIS — M1712 Unilateral primary osteoarthritis, left knee: Secondary | ICD-10-CM | POA: Diagnosis not present

## 2019-03-23 DIAGNOSIS — I2699 Other pulmonary embolism without acute cor pulmonale: Secondary | ICD-10-CM | POA: Diagnosis not present

## 2019-03-27 ENCOUNTER — Ambulatory Visit: Payer: Medicare Other | Admitting: Neurology

## 2019-03-28 NOTE — Progress Notes (Signed)
PATIENT: Calvin Sloan DOB: 1933-11-04  REASON FOR VISIT: follow up HISTORY FROM: patient  HISTORY OF PRESENT ILLNESS: Today 03/29/19  HISTORY  HISTORY OF PRESENT ILLNESS: HISTORY 02/18/15 Calvin Sloan): Calvin Sloan is a 85 -year-old right-handed white married male with generalized seropositive myasthenia gravis characterized by bulbar weakness, shortness of breath , and dysphagia. Patient of Dr. Erling Sloan  He was diagnosed in 10/2002 with a positive Tensilon test, positive acetylchoine receptor antibody,and had a positive ANA at 1-160 speckled pattern. Initially he had right eye ptosis and subsequently, double vision, fatigue with chewing, dysphagia, and upper and lower extremity weakness. He was initially treated with Mestinon bromide in June 2004, which was associated with GI side effects.  Prednisone was started in 03/29/2003 and CellCept 05/2003. He developed increased jitteriness, rash, and cramps on CellCept which was discontinued 10/2003. He had progressive bulbar weakness and was admitted 12/2003 for IV IgG therapy which required a PEG tube for dysphagia transiently. He was started on Imuran. 01/17/2004 he was readmitted for 2 days of IV IgG because of shortness of breath. He has not been hospitalized since 01/2004.   He has been slowly tapered off prednisone 09/2007 and was on Imuran and Mestinon bromide.   He had B12 deficiency and was receiving B12 shots ,but B12 levels off injections have been normal and B12 shots were discontinued.  He has a history of vague visual loss on the left of unknown etiology. He denies swallowing problems, speech problems, voice changes, focal weakness, or double vision.  He is independent in activities of daily living and takes one nap per day.He denies memory loss but his wife states that he does have memory loss. His last blood studies in my office 05/27/10 were normal except a low white blood cell count of 3900.  His dermatologist Dr. Janann Sloan  was concerned that the Imuran is associated with increased risk of skin cancer and the imuran was discontinued in 12/30/2010. His skin cancer (basal cell cancer) has improved since his imuran was stopped.  He has noted no change In myasthenic symptoms. His skin has improved. His medication for myasthenia is mesinon bromide 60 milligrams one and one half tablets 4 times a day. He has shortness of breath since his pulmonary emboli in 2003, also complains of excessive fatigue.   He is allergic to aspirin, GI side effect, gastric ulcer, and is on Plavix for a history of TIAs with right arm weakness He has swallowing problems at the beginning of the meal with liquids that improves as the meal continues.   He had right CTS release in May 2014 by Dr. Daylene Sloan.  He complains of shortness of breath with walking, bending over to tire his shoed, due to PE 2003, pulmnolgist folllowed up, he does not like to chew on tough beef, he denies swallowing difficulty, no double vision, no droopy eyelid, no significant bleeding muscle weakness. He is active in yard, shop, threadmill   UPDATE Jan 16th 2015: He is now tapering off Mestinon, there was no significant change in his functional status, he denies double vision, no ptosis, continue complains of shortness of breath with exertion no significant and muscle weakness, he has to wash down his food sometimes, but no significant swelling, or chewing difficulty, no dysarthria  UPDATE July 16th 2015: He is no longer mestinon, he does complains of increased breathing difficulty from prolonged walking, no chewing difficulty, no double vision, no ptosis.  Laboratory continue to demonstrate positive acetylcholine binding, and  modulating antibodies. He did reported that his skin cancer has improved after stopping imuran, could not tolerate cellcept per record. Previously he responded to IVIG very well.  UPDATE Sept 25th 2015: He was stated on prednisone 10mg  qday,  and mestinon 60mg  tid in June 2015, which did help his fatigue, shortness of breath, wife reported 50% improvement, he has increased appetite  But he was admitted to [name] hospital in Sept 17th for bilateral PEs, right leg DVT, he is started on coumadin, bridge with Lovenox, Dr. Nadara Sloan is monitoring his INR  He denies double vision, no ptosis, no limb muscle weakness, no dysphagia  UPDATE April 4th 2016: He is on coumadin for PE, INR was monitored by Dr. Nadara Sloan, his myasthenia gravis is under good control, is taking prednisone 10 mg every day, he has surgery in Nov 2015 for right leg cellulitis.  He only does little around his house, he has no double vision, SOB due to previous PE,   UPDATE Feb 18 2015: Previous laboratory evaluation showed mild B12 deficiency with level of 204, He did receive IM B12 supplement at his primary care Dr. Lyman Sloan office for a while, now with normal repeat laboratory evaluation, injection has stopped He has mild choking difficulty, this has been going on since Sloan 2016, not getting worse, he has more trouble with liquid. He complains of shortness of breath when bending over. He denies significant gait difficulty, no breathing difficulty otherwise, he denies double vision, no ptosis,, No chewing difficulties   UPDATE May 22 2015: He came in last visit April 16 2015 complains of worsening blurry vision, double vision, swallowing difficulties, trouble talking, he reported 50% weakness compared to baseline, difficulty getting up from seated position, difficulty chewing, worsening shortness of breath, on examination, he was noted to have increased weakness compared to previous examination, he had moderate eye-closure cheek puff, mild neck flexion weakness, mild bilateral shoulder abduction bilateral hip flexion weakness.  He was treated with IVIG 400 mg/kg every day for 5 days from December 11 to May 02 2015, his vision has much improved, he can walk  better, but he continue have swallowing difficulty, dysarthria, he is also taking prednisone 40 mg every day, increase his Mestinon 60 mg to 3-4 tablets daily  He was not able to tolerate immunosuppression treatment in the past due to skin cancer  UPDATE Jul 07 2015:  I have called his wife, pulmonary function test in June 05 2015.  1, FVC is 3.1 5, 87%, this is normal 2. FEV1 is 2.1 6, 77%, with a percent FEV1 of 68, this indicated mild to moderate primary small obstruction in this patient was a history of cigarette smoking, large elderly flow rates are generally normal  3. Flow volume loop is unremarkable 4, lung volumes indicate some evidence of air trapping with an increased residual volume, the TLC is normal, the increased residual volume confirms the presence of airflow obstruction  5, DLCO is 19.0, 90%, this is normal 6, elderly resistant 3.7 2, 265%, this is increased  Wife reported, he recently had swallowing study, there was no significant abnormality found, his swallowing, breathing, symptoms overall has improved  He did think that IVIG treatment in Dec 11-16th 2016 has helped his blurry vision, it is gone, he still feel straggled sometimes, he has shortness with exertion and bending over. His talking is much better.  UPDATE May 22nd 2017: He continue complains of shortness of breath with minimum exertion, no diplopia, no double vision, mild dysphasia,  He had a history of right carpal tunnel release surgery in the past, which has been helpful, now complains of left first 3 finger paresthesia, EMG nerve conduction studies was planned at local hospital  UPDATE July 6th 2017: He stopped taking Mestinon around May 20 third 2017, shortly afterwards, he noticed double vision, he clearly describe eighth of binocular double vision, sometimes vertigo, sometimes horizontal, he went on higher dose of Mestinon 60 mg 4 times a day without improving his symptoms, he slept on a  bed with 30 head raise, with no significant breathing difficulty, he could not eat his dinner on July 5th 2017 due to chewing swallowing difficulty, he could not fly flat sleeping, mildly unsteady gait, increased fatigue,  UPDATE Sloan 17th 2017: Last IVIG treatment was December 05 2015, he did very well for a while, was able to cut grass, trim his lawn, it did helped him for at least 2 weeks, then he had passing out   I reviewed laboratory evaluation in Sloan 2017, normal B12, ferritin level, iron binding capacity was within normal limit, low hemoglobin 10 point 8, creatinine was elevated 1.53, glucose was 125  Previous laboratory evaluation in 2016 showed normal folic acid 12 point 5,low normal B12 level 266, hemoglobin in January 2015 was 14 point 3  He will be evaluated by hematologist/oncologist Dr. Mervin Kung in September 6th 2017, [facility]  He complains of low back pain since he fell you Sloan 1st 2017, has been taking intermittent Tylenol,  He complains of difficulty swallowing, both liquid, and solid food, no double vision, He is taking mestinon 60mg  4 times a day, which did help him.  Update January 29 2016: I have reviewed his hematologist Dr. Grier Mitts note on September 6th 2017, normocytic microchromic anemia, normal WBC, platelet count, likely from chronic kidney disease, history of fetal deficiency, is on B12 supplement, there was a consideration of low level hemolytic anemia from IVIG infusion, no overt evidence of blood loss  I reviewed laboratory evaluation in September 2017, parietal cell antibody IgG was negative, intrinsic factor negative, mild elevated kappa free light chain, Hg 13.8, creat 1.38, GFR 47, ferritin 163, normal TSh 1.5, elevated vitamin B12 1480  He complains more fatigue, more swallowing difficulty, decreased po intake, he could not drinking water because worry about the choking episode, increased shortness of breath with exertion,  intermittent blurry version, difficulty closing his eyes.  He is now taking mestinon 60mg  qid, no significant side effect, was not sure about the benefit.  Also reviewed swallowing evaluation on June 06 2015 at [facility], there was mild oropharyngeal dysphagia, characterized by spillover to the valleculae and the piriform sinus secondary to reduced oral motor coordination  patient was felt to safe to consume a regular diet with a full range of liquid with recommendation of upright for food by mouth, and 45 minutes after meals, head flexed slightly forward , swallow at slower rate  UPDATE Oct 16th 2017: I reviewed laboratory in October 2017, immune of fixative electrophoresis showed mild elevated Ig G2.8, otherwise was normal, mild anemia hemoglobin 11.7, mild elevated kappa.free light chain 26, mild elevated creatinine 1.38, normal ferritin 163, normal iron panel, TSH, folic acid, [id number],  Last ivig was in Sept, it has helped him 80%, he can chew and swallow, he continue complains of blurry vision,  UPDATE May 05 2016: He is overall much improved, but still complains of SOB, is at his baseline, last infusion was on Nov 27,  28th, he will have more infusion in December, he has mild dysarthria, slight dysphagia will see hematologist Dr. Vivianne Spence January 2018,   UPDATE Oct 04 2018: Patient complains of gradual onset swallowing difficulty since April 2020, to the point he has to modify his daily diet, he can only eat soft food, also increased dyspnea upon exertion, has to cut back his daily activity, he denies double vision, no droopy eyelid, denies difficulty to close his eyes tightly, he denies significant limb muscle weakness,  Update December 13, 2018: He is accompanied by his wife at today's clinic visit, he had received IVIG treatment in June, July, planning on receiving it again on Sloan 3 to 10/2018, he tolerated infusion well, noticed moderate to significant  improvement of his muscle weakness, 80% back to his baseline, he can swallowing better, move better, but continue noticed mild bilateral upper extremity weakness, taking Mestinon 4 times a day, prednisone 10 mg daily  Update March 29, 2019 SS: IVIG treatment in June 8, 9, 10, 11, July 6, 7, 8, 9, Sloan 3, 4, Sloan 31, September 1  Since completing IVIG, feels 100% back to baseline.  No longer having difficulty swallowing, but he is careful.  He reports chronic weakness, but no more than normal.  He has been taking prednisone 10 mg every other day.  He remains on Mestinon 60 mg 4 times a day.  He denies double vision or ptosis.  He has chronic shortness of breath, remains on Coumadin.  He is requesting to have a handicap sticker filled out.  He has not had any falls, has resumed his activity in the home.  Overall, doing very well, here with his wife, [name]. A few weeks ago he did a treatment for his skin cancer on his face.   REVIEW OF SYSTEMS: Out of a complete 14 system review of symptoms, the patient complains only of the following symptoms, and all other reviewed systems are negative.  Shortness of breath  ALLERGIES: Allergies  Allergen Reactions  . Aspirin   . Shellfish Allergy   . Tramadol Nausea Only    HOME MEDICATIONS: Outpatient Medications Prior to Visit  Medication Sig Dispense Refill  . pyridostigmine (MESTINON) 60 MG tablet TAKE 1 TABLET FOUR TIMES DAILY 360 tablet 3  . valsartan-hydrochlorothiazide (DIOVAN-HCT) 320-25 MG per tablet Take 1 tablet by mouth daily.    . vitamin B-12 (CYANOCOBALAMIN) 1000 MCG tablet Take 1,000 mcg by mouth daily.     [name] warfarin (COUMADIN) 5 MG tablet 5 mg. Taking 5mg  Mon, Wed, Fri.  Taking 2.5mg  Sun, Tues, Thurs, Sat.    . predniSONE (DELTASONE) 10 MG tablet Take 2 tablets (20 mg total) by mouth daily. 60 tablet 3  . famotidine (PEPCID) 10 MG tablet Take 10 mg by mouth 2 (two) times daily.     No facility-administered medications prior to  visit.     PAST MEDICAL HISTORY: Past Medical History:  Diagnosis Date  . B12 deficiency   . Essential tremor   . Hypertension   . Memory deficit   . Myasthenia gravis ([place])    since 2004  . Pulmonary emboli ([place])    in Nov 2003  . Skin cancer     PAST SURGICAL HISTORY: Past Surgical History:  Procedure Laterality Date  . CARPAL TUNNEL RELEASE Right 09/14/2012   Procedure: CARPAL TUNNEL RELEASE;  Surgeon: Cammie Sickle., MD;  Location: [place];  Service: Orthopedics;  Laterality: Right;  . CARPAL TUNNEL RELEASE Left  11/04/2015   Procedure: LEFT CARPAL TUNNEL RELEASE, EXTENDED INCISION;  Surgeon: Daryll Brod, MD;  Location: [facility];  Service: Orthopedics;  Laterality: Left;  ANESTHESIA: IV REGIONAL UPPER ARM  . CHOLECYSTECTOMY  2012   [place]  . COLONOSCOPY    . FOOT SURGERY  1966   lt orif foot  . SKIN CANCER EXCISION      FAMILY HISTORY: Family History  Problem Relation Age of Onset  . Heart disease Mother   . Heart disease Father     SOCIAL HISTORY: Social History   Socioeconomic History  . Marital status: Married    Spouse name: [name]  . Number of children: 2  . Years of education: 4  . Highest education level: Not on file  Occupational History  . Occupation: Plant    Comment: Retired  Scientific laboratory technician  . Financial resource strain: Not on file  . Food insecurity    Worry: Not on file    Inability: Not on file  . Transportation needs    Medical: Not on file    Non-medical: Not on file  Tobacco Use  . Smoking status: Former Smoker    Quit date: 05/17/1968    Years since quitting: 50.8  . Smokeless tobacco: Never Used  Substance and Sexual Activity  . Alcohol use: No  . Drug use: No  . Sexual activity: Not on file  Lifestyle  . Physical activity    Days per week: Not on file    Minutes per session: Not on file  . Stress: Not on file  Relationships  . Social Herbalist on phone: Not on file    Gets  together: Not on file    Attends religious service: Not on file    Active member of club or organization: Not on file    Attends meetings of clubs or organizations: Not on file    Relationship status: Not on file  . Intimate partner violence    Fear of current or ex partner: Not on file    Emotionally abused: Not on file    Physically abused: Not on file    Forced sexual activity: Not on file  Other Topics Concern  . Not on file  Social History Narrative   He lives with his wife [name](, has 2 children, retired. Hard of hearing.   Right handed.   Caffeine- None    PHYSICAL EXAM  Vitals:   03/29/19 0856  BP: 121/63  Pulse: 61  Temp: 98.8 F (37.1 C)  Weight: 216 lb (98 kg)  Height: 5\' 9"  (1.753 m)   Body mass index is 31.9 kg/m.  Generalized: Well developed, in no acute distress   Neurological examination  Mentation: Alert oriented to time, place, history taking. Follows all commands speech and language fluent Cranial nerve II-XII: Pupils were equal round reactive to light. Extraocular movements were full, visual field were full on confrontational test. Facial sensation and strength were normal. Uvula tongue midline. Head turning and shoulder shrug  were normal and symmetric.  With superior gaze for 1 minute, no ptosis or subjective diplopia was noted.  No eye closure cheek puff weakness noted Motor: The motor testing reveals 5 over 5 strength of all 4 extremities. Good symmetric motor tone is noted throughout.  With arms abducted for 1 minute, mild giveaway weakness noted Sensory: Sensory testing is intact to soft touch on all 4 extremities. No evidence of extinction is noted.  Coordination: Cerebellar testing reveals  good finger-nose-finger and heel-to-shin bilaterally.  Gait and station: Gait is steady, cautious.  Reflexes: Deep tendon reflexes are symmetric and normal bilaterally.   DIAGNOSTIC DATA (LABS, IMAGING, TESTING) - I reviewed patient records, labs, notes,  testing and imaging myself where available.  Lab Results  Component Value Date   WBC 4.9 05/24/2018   HGB 14.8 05/24/2018   HCT 43.5 05/24/2018   MCV 91 05/24/2018   PLT 192 05/24/2018      Component Value Date/Time   NA 141 05/24/2018 1354   K 4.9 05/24/2018 1354   CL 99 05/24/2018 1354   CO2 27 05/24/2018 1354   GLUCOSE 160 (H) 05/24/2018 1354   GLUCOSE 134 (H) 10/25/2016 0951   BUN 17 05/24/2018 1354   CREATININE 1.60 (H) 05/24/2018 1354   CALCIUM 9.6 05/24/2018 1354   PROT 6.5 05/24/2018 1354   ALBUMIN 4.4 05/24/2018 1354   AST 25 05/24/2018 1354   ALT 21 05/24/2018 1354   ALKPHOS 56 05/24/2018 1354   BILITOT 0.6 05/24/2018 1354   GFRNONAA 40 (L) 05/24/2018 1354   GFRAA 46 (L) 05/24/2018 1354   No results found for: CHOL, HDL, LDLCALC, LDLDIRECT, TRIG, CHOLHDL No results found for: HGBA1C Lab Results  Component Value Date   VITAMINB12 1,480 (H) 01/21/2016   Lab Results  Component Value Date   TSH 2.010 08/25/2016    ASSESSMENT AND PLAN 85 y.o. year old male  has a past medical history of B12 deficiency, Essential tremor, Hypertension, Memory deficit, Myasthenia gravis ([place]), Pulmonary emboli ([place]), and Skin cancer. here with:  1.  Seropositive myasthenia gravis -Not a good candidate for long-term immunosuppressive treatment due to recurrent large area of skin cancer -Responded very well to previous IVIG, loading dose in September 2017, initial dose 2g/kg then every 4 weeks 1g/kg, until December 2017 -Recurrent swallowing difficulty since April 2020, treated with prednisone 20 mg daily for 2 weeks, then 10 mg daily, continued Mestinon 60 mg 3-4 times a day -IVIG was reinitiated in June, July 2020 loading dose 2 g/kg, followed by 1 g/kg every 28 days for total of 6 treatments -Due to history of shortness of breath, and infusion of 2 g/kg divided into 4 days -He had treatment June 8, 9, 10, 11-July 6, 7, 8, 9-Sloan 3, 4- Sloan 30, September 1 (appears he had total  of 4 treatment cycles) -Since doing very well, 100% back to baseline, decrease dose of prednisone 5 mg daily for 1 month, then 5 mg every other day, follow-up with Dr. Krista Sloan in 2 months to determine if prednisone can be discontinued, continue Mestinon 60 mg 4 times a day -I filled out a handicap placard for patient and gave to his wife  I spent 25 minutes with the patient. 50% of this time was spent discussing his plan of care.  Butler Denmark, AGNP-C, DNP 03/29/2019, 9:41 AM [facility] [address] [place], [place] [address] [phone]

## 2019-03-29 ENCOUNTER — Other Ambulatory Visit: Payer: Self-pay

## 2019-03-29 ENCOUNTER — Ambulatory Visit (INDEPENDENT_AMBULATORY_CARE_PROVIDER_SITE_OTHER): Payer: Medicare Other | Admitting: Neurology

## 2019-03-29 ENCOUNTER — Encounter: Payer: Self-pay | Admitting: Neurology

## 2019-03-29 VITALS — BP 121/63 | HR 61 | Temp 98.8°F | Ht 69.0 in | Wt 216.0 lb

## 2019-03-29 DIAGNOSIS — G7001 Myasthenia gravis with (acute) exacerbation: Secondary | ICD-10-CM | POA: Diagnosis not present

## 2019-03-29 MED ORDER — PREDNISONE 5 MG PO TABS: ORAL_TABLET | ORAL | 3 refills | Status: DC

## 2019-03-29 NOTE — Patient Instructions (Signed)
We will try to decrease your dose of prednisone. Please take 5 mg everyday for 1 month, then take 5 mg every other day for 1 month. Return to see Dr. [name] in 2 months to determine if prednisone can be discontinued.

## 2019-04-02 NOTE — Progress Notes (Signed)
I have reviewed and agreed above plan. 

## 2019-04-05 DIAGNOSIS — H5203 Hypermetropia, bilateral: Secondary | ICD-10-CM | POA: Diagnosis not present

## 2019-04-05 DIAGNOSIS — H524 Presbyopia: Secondary | ICD-10-CM | POA: Diagnosis not present

## 2019-04-05 DIAGNOSIS — H2513 Age-related nuclear cataract, bilateral: Secondary | ICD-10-CM | POA: Diagnosis not present

## 2019-04-05 DIAGNOSIS — H52223 Regular astigmatism, bilateral: Secondary | ICD-10-CM | POA: Diagnosis not present

## 2019-04-11 ENCOUNTER — Other Ambulatory Visit: Payer: Self-pay

## 2019-04-30 ENCOUNTER — Telehealth: Payer: Self-pay | Admitting: *Deleted

## 2019-04-30 NOTE — Telephone Encounter (Signed)
[facility] wanted to confirm that this patient should continue his IVIG treatment.

## 2019-04-30 NOTE — Telephone Encounter (Signed)
Based on last visit with Judson Roch on March 27, 2019, patient was doing very well, will hold off IVIG infusion at this point

## 2019-04-30 NOTE — Telephone Encounter (Signed)
I will provide this information to [facility].

## 2019-05-04 ENCOUNTER — Other Ambulatory Visit: Payer: Self-pay | Admitting: Neurology

## 2019-05-29 ENCOUNTER — Ambulatory Visit: Payer: Medicare Other | Admitting: Neurology

## 2019-06-15 DIAGNOSIS — I82401 Acute embolism and thrombosis of unspecified deep veins of right lower extremity: Secondary | ICD-10-CM | POA: Diagnosis not present

## 2019-06-15 DIAGNOSIS — M1732 Unilateral post-traumatic osteoarthritis, left knee: Secondary | ICD-10-CM | POA: Diagnosis not present

## 2019-07-16 ENCOUNTER — Encounter: Payer: Self-pay | Admitting: Neurology

## 2019-07-16 ENCOUNTER — Ambulatory Visit (INDEPENDENT_AMBULATORY_CARE_PROVIDER_SITE_OTHER): Payer: Medicare Other | Admitting: Neurology

## 2019-07-16 ENCOUNTER — Other Ambulatory Visit: Payer: Self-pay

## 2019-07-16 VITALS — BP 136/73 | HR 78 | Temp 97.9°F | Ht 69.0 in | Wt 215.0 lb

## 2019-07-16 DIAGNOSIS — G7001 Myasthenia gravis with (acute) exacerbation: Secondary | ICD-10-CM | POA: Diagnosis not present

## 2019-07-16 MED ORDER — PYRIDOSTIGMINE BROMIDE 60 MG PO TABS: 60.0000 mg | ORAL_TABLET | Freq: Three times a day (TID) | ORAL | 3 refills | Status: DC

## 2019-07-16 NOTE — Progress Notes (Signed)
PATIENT: Calvin Sloan DOB: Aug 05, 1933  REASON FOR VISIT: follow up HISTORY FROM: patient  HISTORY OF PRESENT ILLNESS: Today 07/16/19  HISTORY  HISTORY OF PRESENT ILLNESS: HISTORY 02/18/15 Calvin Sloan): Calvin Sloan is a 86 -year-old right-handed white married male with generalized seropositive myasthenia gravis characterized by bulbar weakness, shortness of breath , and dysphagia. Patient of [name]  He was diagnosed in 10/2002 with a positive Tensilon test, positive acetylchoine receptor antibody,and had a positive ANA at 1-160 speckled pattern. Initially he had right eye ptosis and subsequently, double vision, fatigue with chewing, dysphagia, and upper and lower extremity weakness. He was initially treated with Mestinon bromide in June 2004, which was associated with GI side effects.  Prednisone was started in 03/29/2003 and CellCept 05/2003. He developed increased jitteriness, rash, and cramps on CellCept which was discontinued 10/2003. He had progressive bulbar weakness and was admitted 12/2003 for IV IgG therapy which required a PEG tube for dysphagia transiently. He was started on Imuran. 01/17/2004 he was readmitted for 2 days of IV IgG because of shortness of breath. He has not been hospitalized since 01/2004.   He has been slowly tapered off prednisone 09/2007 and was on Imuran and Mestinon bromide.   He had B12 deficiency and was receiving B12 shots ,but B12 levels off injections have been normal and B12 shots were discontinued.  He has a history of vague visual loss on the left of unknown etiology. He denies swallowing problems, speech problems, voice changes, focal weakness, or double vision.  He is independent in activities of daily living and takes one nap per day.He denies memory loss but his wife states that he does have memory loss. His last blood studies in my office 05/27/10 were normal except a low white blood cell count of 3900.  His dermatologist [name]  was concerned that the Imuran is associated with increased risk of skin cancer and the imuran was discontinued in 12/30/2010. His skin cancer (basal cell cancer) has improved since his imuran was stopped.  He has noted no change In myasthenic symptoms. His skin has improved. His medication for myasthenia is mesinon bromide 60 milligrams one and one half tablets 4 times a day. He has shortness of breath since his pulmonary emboli in 2003, also complains of excessive fatigue.   He is allergic to aspirin, GI side effect, gastric ulcer, and is on Plavix for a history of TIAs with right arm weakness He has swallowing problems at the beginning of the meal with liquids that improves as the meal continues.   He had right CTS release in May 2014 by [name].  He complains of shortness of breath with walking, bending over to tire his shoed, due to PE 2003, pulmnolgist folllowed up, he does not like to chew on tough beef, he denies swallowing difficulty, no double vision, no droopy eyelid, no significant bleeding muscle weakness. He is active in yard, shop, threadmill   UPDATE Jan 16th 2015: He is now tapering off Mestinon, there was no significant change in his functional status, he denies double vision, no ptosis, continue complains of shortness of breath with exertion no significant and muscle weakness, he has to wash down his food sometimes, but no significant swelling, or chewing difficulty, no dysarthria  UPDATE July 16th 2015: He is no longer mestinon, he does complains of increased breathing difficulty from prolonged walking, no chewing difficulty, no double vision, no ptosis.  Laboratory continue to demonstrate positive acetylcholine binding, and  modulating antibodies. He did reported that his skin cancer has improved after stopping imuran, could not tolerate cellcept per record. Previously he responded to IVIG very well.  UPDATE Sept 25th 2015: He was stated on prednisone 10mg  qday,  and mestinon 60mg  tid in June 2015, which did help his fatigue, shortness of breath, wife reported 50% improvement, he has increased appetite  But he was admitted to [name] hospital in Sept 17th for bilateral PEs, right leg DVT, he is started on coumadin, bridge with Lovenox, [name] is monitoring his INR  He denies double vision, no ptosis, no limb muscle weakness, no dysphagia  UPDATE April 4th 2016: He is on coumadin for PE, INR was monitored by [name], his myasthenia gravis is under good control, is taking prednisone 10 mg every day, he has surgery in Nov 2015 for right leg cellulitis.  He only does little around his house, he has no double vision, SOB due to previous PE,   UPDATE Feb 18 2015: Previous laboratory evaluation showed mild B12 deficiency with level of 204, He did receive IM B12 supplement at his primary care Dr. [name] office for a while, now with normal repeat laboratory evaluation, injection has stopped He has mild choking difficulty, this has been going on since August 2016, not getting worse, he has more trouble with liquid. He complains of shortness of breath when bending over. He denies significant gait difficulty, no breathing difficulty otherwise, he denies double vision, no ptosis,, No chewing difficulties   UPDATE May 22 2015: He came in last visit April 16 2015 complains of worsening blurry vision, double vision, swallowing difficulties, trouble talking, he reported 50% weakness compared to baseline, difficulty getting up from seated position, difficulty chewing, worsening shortness of breath, on examination, he was noted to have increased weakness compared to previous examination, he had moderate eye-closure cheek puff, mild neck flexion weakness, mild bilateral shoulder abduction bilateral hip flexion weakness.  He was treated with IVIG 400 mg/kg every day for 5 days from December 11 to May 02 2015, his vision has much improved, he can walk  better, but he continue have swallowing difficulty, dysarthria, he is also taking prednisone 40 mg every day, increase his Mestinon 60 mg to 3-4 tablets daily  He was not able to tolerate immunosuppression treatment in the past due to skin cancer  UPDATE Jul 07 2015:  I have called his wife, pulmonary function test in June 05 2015.  1, FVC is 3.1 5, 87%, this is normal 2. FEV1 is 2.1 6, 77%, with a percent FEV1 of 68, this indicated mild to moderate primary small obstruction in this patient was a history of cigarette smoking, large elderly flow rates are generally normal  3. Flow volume loop is unremarkable 4, lung volumes indicate some evidence of air trapping with an increased residual volume, the TLC is normal, the increased residual volume confirms the presence of airflow obstruction  5, DLCO is 19.0, 90%, this is normal 6, elderly resistant 3.7 2, 265%, this is increased  Wife reported, he recently had swallowing study, there was no significant abnormality found, his swallowing, breathing, symptoms overall has improved  He did think that IVIG treatment in Dec 11-16th 2016 has helped his blurry vision, it is gone, he still feel straggled sometimes, he has shortness with exertion and bending over. His talking is much better.  UPDATE May 22nd 2017: He continue complains of shortness of breath with minimum exertion, no diplopia, no double vision, mild dysphasia,  He had a history of right carpal tunnel release surgery in the past, which has been helpful, now complains of left first 3 finger paresthesia, EMG nerve conduction studies was planned at local hospital  UPDATE July 6th 2017: He stopped taking Mestinon around May 20 third 2017, shortly afterwards, he noticed double vision, he clearly describe eighth of binocular double vision, sometimes vertigo, sometimes horizontal, he went on higher dose of Mestinon 60 mg 4 times a day without improving his symptoms, he slept on a  bed with 30 head raise, with no significant breathing difficulty, he could not eat his dinner on July 5th 2017 due to chewing swallowing difficulty, he could not fly flat sleeping, mildly unsteady gait, increased fatigue,  UPDATE August 17th 2017: Last IVIG treatment was December 05 2015, he did very well for a while, was able to cut grass, trim his lawn, it did helped him for at least 2 weeks, then he had passing out   I reviewed laboratory evaluation in August 2017, normal B12, ferritin level, iron binding capacity was within normal limit, low hemoglobin 10 point 8, creatinine was elevated 1.53, glucose was 125  Previous laboratory evaluation in 2016 showed normal folic acid 12 point 5,low normal B12 level 266, hemoglobin in January 2015 was 14 point 3  He will be evaluated by hematologist/oncologist Dr. Mervin Kung in September 6th 2017, at [facility]  He complains of low back pain since he fell you August 1st 2017, has been taking intermittent Tylenol,  He complains of difficulty swallowing, both liquid, and solid food, no double vision, He is taking mestinon 60mg  4 times a day, which did help him.  Update January 29 2016: I have reviewed his hematologist Dr. Grier Mitts note on September 6th 2017, normocytic microchromic anemia, normal WBC, platelet count, likely from chronic kidney disease, history of fetal deficiency, is on B12 supplement, there was a consideration of low level hemolytic anemia from IVIG infusion, no overt evidence of blood loss  I reviewed laboratory evaluation in September 2017, parietal cell antibody IgG was negative, intrinsic factor negative, mild elevated kappa free light chain, Hg 13.8, creat 1.38, GFR 47, ferritin 163, normal TSh 1.5, elevated vitamin B12 1480  He complains more fatigue, more swallowing difficulty, decreased po intake, he could not drinking water because worry about the choking episode, increased shortness of breath with exertion,  intermittent blurry version, difficulty closing his eyes.  He is now taking mestinon 60mg  qid, no significant side effect, was not sure about the benefit.  Also reviewed swallowing evaluation on June 06 2015 at [facility], there was mild oropharyngeal dysphagia, characterized by spillover to the valleculae and the piriform sinus secondary to reduced oral motor coordination  patient was felt to safe to consume a regular diet with a full range of liquid with recommendation of upright for food by mouth, and 45 minutes after meals, head flexed slightly forward , swallow at slower rate  UPDATE Oct 16th 2017: I reviewed laboratory in October 2017, immune of fixative electrophoresis showed mild elevated Ig G2.8, otherwise was normal, mild anemia hemoglobin 11.7, mild elevated kappa.free light chain 26, mild elevated creatinine 1.38, normal ferritin 163, normal iron panel, TSH, folic acid, [id number],  Last ivig was in Sept, it has helped him 80%, he can chew and swallow, he continue complains of blurry vision,  UPDATE May 05 2016: He is overall much improved, but still complains of SOB, is at his baseline, last infusion was on Nov 27,  28th, he will have more infusion in December, he has mild dysarthria, slight dysphagia will see hematologist Dr. Vivianne Spence January 2018,   UPDATE Oct 04 2018: Patient complains of gradual onset swallowing difficulty since April 2020, to the point he has to modify his daily diet, he can only eat soft food, also increased dyspnea upon exertion, has to cut back his daily activity, he denies double vision, no droopy eyelid, denies difficulty to close his eyes tightly, he denies significant limb muscle weakness,  Update December 13, 2018: He is accompanied by his wife at today's clinic visit, he had received IVIG treatment in June, July, planning on receiving it again on August 3 to 10/2018, he tolerated infusion well, noticed moderate to significant  improvement of his muscle weakness, 80% back to his baseline, he can swallowing better, move better, but continue noticed mild bilateral upper extremity weakness, taking Mestinon 4 times a day, prednisone 10 mg daily  UPDATE July 16 2019: He had IVIG treatment in June, July, August, September 2020, after that, he was able to bounce back to baseline, gradually tapered off prednisone treatment,  He is still taking Mestinon 60 mg 4 times a day, complains of shortness of breath occasionally denied double vision, swallowing difficulty  REVIEW OF SYSTEMS: Out of a complete 14 system review of symptoms, the patient complains only of the following symptoms, and all other reviewed systems are negative.  Shortness of breath  ALLERGIES: Allergies  Allergen Reactions  . Aspirin   . Shellfish Allergy   . Tramadol Nausea Only    HOME MEDICATIONS: Outpatient Medications Prior to Visit  Medication Sig Dispense Refill  . pyridostigmine (MESTINON) 60 MG tablet TAKE 1 TABLET FOUR TIMES DAILY 360 tablet 3  . valsartan-hydrochlorothiazide (DIOVAN-HCT) 320-25 MG per tablet Take 1 tablet by mouth daily.    . vitamin B-12 (CYANOCOBALAMIN) 1000 MCG tablet Take 1,000 mcg by mouth daily.     [name] warfarin (COUMADIN) 5 MG tablet 5 mg. Taking 5mg  Mon, Wed, Fri.  Taking 2.5mg  Sun, Tues, Thurs, Sat.    . predniSONE (DELTASONE) 5 MG tablet Take 1 tablet daily x 1 month, then take 1 tablet every other day 60 tablet 3   No facility-administered medications prior to visit.    PAST MEDICAL HISTORY: Past Medical History:  Diagnosis Date  . B12 deficiency   . Essential tremor   . Hypertension   . Memory deficit   . Myasthenia gravis ([place])    since 2004  . Pulmonary emboli ([place])    in Nov 2003  . Skin cancer     PAST SURGICAL HISTORY: Past Surgical History:  Procedure Laterality Date  . CARPAL TUNNEL RELEASE Right 09/14/2012   Procedure: CARPAL TUNNEL RELEASE;  Surgeon: Cammie Sickle., MD;  Location: [facility];  Service: Orthopedics;  Laterality: Right;  . CARPAL TUNNEL RELEASE Left 11/04/2015   Procedure: LEFT CARPAL TUNNEL RELEASE, EXTENDED INCISION;  Surgeon: Daryll Brod, MD;  Location: [facility];  Service: Orthopedics;  Laterality: Left;  ANESTHESIA: IV REGIONAL UPPER ARM  . CHOLECYSTECTOMY  2012   [facility]  . COLONOSCOPY    . FOOT SURGERY  1966   lt orif foot  . SKIN CANCER EXCISION      FAMILY HISTORY: Family History  Problem Relation Age of Onset  . Heart disease Mother   . Heart disease Father     SOCIAL HISTORY: Social History   Socioeconomic History  . Marital status: Married  Spouse name: [name]  . Number of children: 2  . Years of education: 54  . Highest education level: Not on file  Occupational History  . Occupation: Plant    Comment: Retired  Tobacco Use  . Smoking status: Former Smoker    Quit date: 05/17/1968    Years since quitting: 51.1  . Smokeless tobacco: Never Used  Substance and Sexual Activity  . Alcohol use: No  . Drug use: No  . Sexual activity: Not on file  Other Topics Concern  . Not on file  Social History Narrative   He lives with his wife [name](, has 2 children, retired. Hard of hearing.   Right handed.   Caffeine- None   Social Determinants of Health   Financial Resource Strain:   . Difficulty of Paying Living Expenses: Not on file  Food Insecurity:   . Worried About Charity fundraiser in the Last Year: Not on file  . Ran Out of Food in the Last Year: Not on file  Transportation Needs:   . Lack of Transportation (Medical): Not on file  . Lack of Transportation (Non-Medical): Not on file  Physical Activity:   . Days of Exercise per Week: Not on file  . Minutes of Exercise per Session: Not on file  Stress:   . Feeling of Stress : Not on file  Social Connections:   . Frequency of Communication with Friends and Family: Not on file  . Frequency of Social Gatherings with Friends and Family: Not on  file  . Attends Religious Services: Not on file  . Active Member of Clubs or Organizations: Not on file  . Attends Archivist Meetings: Not on file  . Marital Status: Not on file  Intimate Partner Violence:   . Fear of Current or Ex-Partner: Not on file  . Emotionally Abused: Not on file  . Physically Abused: Not on file  . Sexually Abused: Not on file    PHYSICAL EXAM  Vitals:   07/16/19 1400  BP: 136/73  Pulse: 78  Temp: 97.9 F (36.6 C)  Weight: 215 lb (97.5 kg)  Height: 5\' 9"  (1.753 m)   Body mass index is 31.75 kg/m.   PHYSICAL EXAMNIATION:  Gen: NAD, conversant, well nourised, well groomed                     Cardiovascular: Regular rate rhythm, no peripheral edema, warm, nontender. Eyes: Conjunctivae clear without exudates or hemorrhage Neck: Supple, no carotid bruits. Pulmonary: Clear to auscultation bilaterally   NEUROLOGICAL EXAM:  MENTAL STATUS: Speech:    Speech is normal; fluent and spontaneous with normal comprehension.  Cognition:     Orientation to time, place and person     Normal recent and remote memory     Normal Attention span and concentration     Normal Language, naming, repeating,spontaneous speech     Fund of knowledge   CRANIAL NERVES: CN II: Visual fields are full to confrontation.  Pupils are round equal and briskly reactive to light. CN III, IV, VI: extraocular movement are normal. No ptosis. CN V: Facial sensation is intact  CN VII: He has mild eye closure, cheek puff weakness CN VIII: Hearing is normal to casual conversation CN IX, X: Palate elevates symmetrically. Phonation is normal. CN XI: Head turning and shoulder shrug are intact CN XII: Tongue is midline with normal movements and no atrophy.  MOTOR: He has no neck flexion weakness, mild bilateral shoulder abduction  weakness  REFLEXES: Reflexes are 1 and symmetric at the biceps, triceps, knees, and ankles. Plantar responses are flexor.  SENSORY: Intact to  light touch,    COORDINATION: Rapid alternating movements and fine finger movements are intact. There is no dysmetria on finger-to-nose and heel-knee-shin.    GAIT/STANCE: He can get up from seated position arms crossed, by 2 attempts, steady gait, Romberg is absent.   DIAGNOSTIC DATA (LABS, IMAGING, TESTING) - I reviewed patient records, labs, notes, testing and imaging myself where available.  Lab Results  Component Value Date   WBC 4.9 05/24/2018   HGB 14.8 05/24/2018   HCT 43.5 05/24/2018   MCV 91 05/24/2018   PLT 192 05/24/2018      Component Value Date/Time   NA 141 05/24/2018 1354   K 4.9 05/24/2018 1354   CL 99 05/24/2018 1354   CO2 27 05/24/2018 1354   GLUCOSE 160 (H) 05/24/2018 1354   GLUCOSE 134 (H) 10/25/2016 0951   BUN 17 05/24/2018 1354   CREATININE 1.60 (H) 05/24/2018 1354   CALCIUM 9.6 05/24/2018 1354   PROT 6.5 05/24/2018 1354   ALBUMIN 4.4 05/24/2018 1354   AST 25 05/24/2018 1354   ALT 21 05/24/2018 1354   ALKPHOS 56 05/24/2018 1354   BILITOT 0.6 05/24/2018 1354   GFRNONAA 40 (L) 05/24/2018 1354   GFRAA 46 (L) 05/24/2018 1354   No results found for: CHOL, HDL, LDLCALC, LDLDIRECT, TRIG, CHOLHDL No results found for: HGBA1C Lab Results  Component Value Date   VITAMINB12 1,480 (H) 01/21/2016   Lab Results  Component Value Date   TSH 2.010 08/25/2016    ASSESSMENT AND PLAN 86 y.o. year old male   Seropositive myasthenia gravis  Not a good candidate for long-term immunosuppressive treatment due to recurrent large area of skin cancer  Responded very well to previous IVIG, initial dose was in September 2017, loading dose of 2g/kg then every 4 weeks 1g/kg, until December 2017  Recurrent swallowing difficulty since April 2020, treated with prednisone 20 mg daily for 2 weeks, then 10 mg daily, continued Mestinon 60 mg 3-4 times a day,    Due to suboptimal response, IVIG was reinitiated in June, July 2020 loading dose 2 g/kg, followed by 1 g/kg every  28 days for total of 6 treatments, last infusion was in September 2020,  -Due to history of shortness of breath, and infusion of 2 g/kg divided into 4 days,  He responded very well, now back to baseline, mild eye closure, cheek puff, upper extremity proximal muscle weakness, baseline intermittent shortness of breath with exertion, this has been a problem since his PE in 2000 03  Continue Mestinon 60 mg 3 times a day, he has been off prednisone  Marcial Pacas, M.D. Ph.D.  [facility] [place], [place] [address] Phone: [phone] Fax:      [phone]

## 2019-07-27 DIAGNOSIS — M545 Low back pain: Secondary | ICD-10-CM | POA: Diagnosis not present

## 2019-07-27 DIAGNOSIS — Z86711 Personal history of pulmonary embolism: Secondary | ICD-10-CM | POA: Diagnosis not present

## 2019-07-27 DIAGNOSIS — I2699 Other pulmonary embolism without acute cor pulmonale: Secondary | ICD-10-CM | POA: Diagnosis not present

## 2019-07-27 DIAGNOSIS — Z6832 Body mass index (BMI) 32.0-32.9, adult: Secondary | ICD-10-CM | POA: Diagnosis not present

## 2019-07-27 DIAGNOSIS — I10 Essential (primary) hypertension: Secondary | ICD-10-CM | POA: Diagnosis not present

## 2019-09-06 DIAGNOSIS — I82401 Acute embolism and thrombosis of unspecified deep veins of right lower extremity: Secondary | ICD-10-CM | POA: Diagnosis not present

## 2019-09-06 DIAGNOSIS — Z86711 Personal history of pulmonary embolism: Secondary | ICD-10-CM | POA: Diagnosis not present

## 2019-09-12 DIAGNOSIS — M1732 Unilateral post-traumatic osteoarthritis, left knee: Secondary | ICD-10-CM | POA: Diagnosis not present

## 2019-09-20 ENCOUNTER — Other Ambulatory Visit: Payer: Self-pay | Admitting: *Deleted

## 2019-09-20 MED ORDER — PYRIDOSTIGMINE BROMIDE 60 MG PO TABS: 60.0000 mg | ORAL_TABLET | Freq: Three times a day (TID) | ORAL | 3 refills | Status: DC

## 2019-10-18 DIAGNOSIS — I4891 Unspecified atrial fibrillation: Secondary | ICD-10-CM | POA: Diagnosis not present

## 2019-10-25 DIAGNOSIS — I4891 Unspecified atrial fibrillation: Secondary | ICD-10-CM | POA: Diagnosis not present

## 2019-12-05 DIAGNOSIS — I82401 Acute embolism and thrombosis of unspecified deep veins of right lower extremity: Secondary | ICD-10-CM | POA: Diagnosis not present

## 2019-12-05 DIAGNOSIS — I4891 Unspecified atrial fibrillation: Secondary | ICD-10-CM | POA: Diagnosis not present

## 2019-12-05 DIAGNOSIS — H02122 Mechanical ectropion of right lower eyelid: Secondary | ICD-10-CM | POA: Diagnosis not present

## 2019-12-05 DIAGNOSIS — H2513 Age-related nuclear cataract, bilateral: Secondary | ICD-10-CM | POA: Diagnosis not present

## 2019-12-12 DIAGNOSIS — M1732 Unilateral post-traumatic osteoarthritis, left knee: Secondary | ICD-10-CM | POA: Diagnosis not present

## 2020-01-10 DIAGNOSIS — H02132 Senile ectropion of right lower eyelid: Secondary | ICD-10-CM | POA: Diagnosis not present

## 2020-01-10 DIAGNOSIS — H2511 Age-related nuclear cataract, right eye: Secondary | ICD-10-CM | POA: Diagnosis not present

## 2020-01-10 DIAGNOSIS — Z01818 Encounter for other preprocedural examination: Secondary | ICD-10-CM | POA: Diagnosis not present

## 2020-01-10 DIAGNOSIS — H2512 Age-related nuclear cataract, left eye: Secondary | ICD-10-CM | POA: Diagnosis not present

## 2020-01-16 DIAGNOSIS — I82401 Acute embolism and thrombosis of unspecified deep veins of right lower extremity: Secondary | ICD-10-CM | POA: Diagnosis not present

## 2020-01-16 DIAGNOSIS — I4891 Unspecified atrial fibrillation: Secondary | ICD-10-CM | POA: Diagnosis not present

## 2020-01-16 NOTE — Progress Notes (Signed)
PATIENT: Calvin Sloan DOB: [birth year]  REASON FOR VISIT: follow up HISTORY FROM: patient  HISTORY OF PRESENT ILLNESS: Today 01/17/20  HISTORY HISTORY 02/18/15 Calvin Sloan): [name] is a 86 -year-old right-handed white married male with generalized seropositive myasthenia gravis characterized by bulbar weakness, shortness of breath , and dysphagia. Patient of Dr. Erling Sloan  He was diagnosed in 10/2002 with a positive Tensilon test, positive acetylchoine receptor antibody,and had a positive ANA at 1-160 speckled pattern. Initially he had right eye ptosis and subsequently, double vision, fatigue with chewing, dysphagia, and upper and lower extremity weakness. He was initially treated with Mestinon bromide in June 2004, which was associated with GI side effects.  Prednisone was started in 03/29/2003 and CellCept 05/2003. He developed increased jitteriness, rash, and cramps on CellCept which was discontinued 10/2003. He had progressive bulbar weakness and was admitted 12/2003 for IV IgG therapy which required a PEG tube for dysphagia transiently. He was started on Imuran. 01/17/2004 he was readmitted for 2 days of IV IgG because of shortness of breath. He has not been hospitalized since 01/2004.   He has been slowly tapered off prednisone 09/2007 and was on Imuran and Mestinon bromide.   He had B12 deficiency and was receiving B12 shots ,but B12 levels off injections have been normal and B12 shots were discontinued.  He has a history of vague visual loss on the left of unknown etiology. He denies swallowing problems, speech problems, voice changes, focal weakness, or double vision.  He is independent in activities of daily living and takes one nap per day.He denies memory loss but his wife states that he does have memory loss. His last blood studies in my office 05/27/10 were normal except a low white blood cell count of 3900.  His dermatologist Dr. Janann Sloan was concerned that the Imuran  is associated with increased risk of skin cancer and the imuran was discontinued in 12/30/2010. His skin cancer (basal cell cancer) has improved since his imuran was stopped.  He has noted no change In myasthenic symptoms. His skin has improved. His medication for myasthenia is mesinon bromide 60 milligrams one and one half tablets 4 times a day. He has shortness of breath since his pulmonary emboli in 2003, also complains of excessive fatigue.   He is allergic to aspirin, GI side effect, gastric ulcer, and is on Plavix for a history of TIAs with right arm weakness He has swallowing problems at the beginning of the meal with liquids that improves as the meal continues.   He had right CTS release in May 2014 by Dr. Daylene Sloan.  He complains of shortness of breath with walking, bending over to tire his shoed, due to PE 2003, pulmnolgist folllowed up, he does not like to chew on tough beef, he denies swallowing difficulty, no double vision, no droopy eyelid, no significant bleeding muscle weakness. He is active in yard, shop, threadmill   UPDATE Jan 16th 2015: He is now tapering off Mestinon, there was no significant change in his functional status, he denies double vision, no ptosis, continue complains of shortness of breath with exertion no significant and muscle weakness, he has to wash down his food sometimes, but no significant swelling, or chewing difficulty, no dysarthria  UPDATE July 16th 2015: He is no longer mestinon, he does complains of increased breathing difficulty from prolonged walking, no chewing difficulty, no double vision, no ptosis.  Laboratory continue to demonstrate positive acetylcholine binding, and modulating antibodies. He did reported  that his skin cancer has improved after stopping imuran, could not tolerate cellcept per record. Previously he responded to IVIG very well.  UPDATE Sept 25th 2015: He was stated on prednisone 10mg  qday, and mestinon 60mg  tid in June  2015, which did help his fatigue, shortness of breath, wife reported 50% improvement, he has increased appetite  But he was admitted to [facility] hospital in Sept 17th for bilateral PEs, right leg DVT, he is started on coumadin, bridge with Lovenox, Dr. Nadara Sloan is monitoring his INR  He denies double vision, no ptosis, no limb muscle weakness, no dysphagia  UPDATE April 4th 2016: He is on coumadin for PE, INR was monitored by Dr. Nadara Sloan, his myasthenia gravis is under good control, is taking prednisone 10 mg every day, he has surgery in Nov 2015 for right leg cellulitis.  He only does little around his house, he has no double vision, SOB due to previous PE,   UPDATE Feb 18 2015: Previous laboratory evaluation showed mild B12 deficiency with level of 204, He did receive IM B12 supplement at his primary care Dr. Lyman Sloan office for a while, now with normal repeat laboratory evaluation, injection has stopped He has mild choking difficulty, this has been going on since Sloan 2016, not getting worse, he has more trouble with liquid. He complains of shortness of breath when bending over. He denies significant gait difficulty, no breathing difficulty otherwise, he denies double vision, no ptosis,, No chewing difficulties   UPDATE May 22 2015: He came in last visit April 16 2015 complains of worsening blurry vision, double vision, swallowing difficulties, trouble talking, he reported 50% weakness compared to baseline, difficulty getting up from seated position, difficulty chewing, worsening shortness of breath, on examination, he was noted to have increased weakness compared to previous examination, he had moderate eye-closure cheek puff, mild neck flexion weakness, mild bilateral shoulder abduction bilateral hip flexion weakness.  He was treated with IVIG 400 mg/kg every day for 5 days from December 11 to May 02 2015, his vision has much improved, he can walk better, but he continue have  swallowing difficulty, dysarthria, he is also taking prednisone 40 mg every day, increase his Mestinon 60 mg to 3-4 tablets daily  He was not able to tolerate immunosuppression treatment in the past due to skin cancer  UPDATE Jul 07 2015:  I have called his wife, pulmonary function test in June 05 2015.  1, FVC is 3.1 5, 87%, this is normal 2. FEV1 is 2.1 6, 77%, with a percent FEV1 of 68, this indicated mild to moderate primary small obstruction in this patient was a history of cigarette smoking, large elderly flow rates are generally normal  3. Flow volume loop is unremarkable 4, lung volumes indicate some evidence of air trapping with an increased residual volume, the TLC is normal, the increased residual volume confirms the presence of airflow obstruction  5, DLCO is 19.0, 90%, this is normal 6, elderly resistant 3.7 2, 265%, this is increased  Wife reported, he recently had swallowing study, there was no significant abnormality found, his swallowing, breathing, symptoms overall has improved  He did think that IVIG treatment in Dec 11-16th 2016 has helped his blurry vision, it is gone, he still feel straggled sometimes, he has shortness with exertion and bending over. His talking is much better.  UPDATE May 22nd 2017: He continue complains of shortness of breath with minimum exertion, no diplopia, no double vision, mild dysphasia,  He had a history  of right carpal tunnel release surgery in the past, which has been helpful, now complains of left first 3 finger paresthesia, EMG nerve conduction studies was planned at local hospital  UPDATE July 6th 2017: He stopped taking Mestinon around May 20 third 2017, shortly afterwards, he noticed double vision, he clearly describe eighth of binocular double vision, sometimes vertigo, sometimes horizontal, he went on higher dose of Mestinon 60 mg 4 times a day without improving his symptoms, he slept on a bed with 30 head raise,  with no significant breathing difficulty, he could not eat his dinner on July 5th 2017 due to chewing swallowing difficulty, he could not fly flat sleeping, mildly unsteady gait, increased fatigue,  UPDATE Sloan 17th 2017: Last IVIG treatment was December 05 2015, he did very well for a while, was able to cut grass, trim his lawn, it did helped him for at least 2 weeks, then he had passing out   I reviewed laboratory evaluation in Sloan 2017, normal B12, ferritin level, iron binding capacity was within normal limit, low hemoglobin 10 point 8, creatinine was elevated 1.53, glucose was 125  Previous laboratory evaluation in 2016 showed normal folic acid 12 point 5,low normal B12 level 266, hemoglobin in January 2015 was 14 point 3  He will be evaluated by hematologist/oncologist Dr. Mervin Kung in September 6th 2017, at [facility]  He complains of low back pain since he fell you Sloan 1st 2017, has been taking intermittent Tylenol,  He complains of difficulty swallowing, both liquid, and solid food, no double vision, He is taking mestinon 60mg  4 times a day, which did help him.  Update January 29 2016: I have reviewed his hematologist Dr. Grier Mitts note on September 6th 2017, normocytic microchromic anemia, normal WBC, platelet count, likely from chronic kidney disease, history of fetal deficiency, is on B12 supplement, there was a consideration of low level hemolytic anemia from IVIG infusion, no overt evidence of blood loss  I reviewed laboratory evaluation in September 2017, parietal cell antibody IgG was negative, intrinsic factor negative, mild elevated kappa free light chain, Hg 13.8, creat 1.38, GFR 47, ferritin 163, normal TSh 1.5, elevated vitamin B12 1480  He complains more fatigue, more swallowing difficulty, decreased po intake, he could not drinking water because worry about the choking episode, increased shortness of breath with exertion, intermittent blurry version,  difficulty closing his eyes.  He is now taking mestinon 60mg  qid, no significant side effect, was not sure about the benefit.  Also reviewed swallowing evaluation on June 06 2015 at [facility], there was mild oropharyngeal dysphagia, characterized by spillover to the valleculae and the piriform sinus secondary to reduced oral motor coordination  patient was felt to safe to consume a regular diet with a full range of liquid with recommendation of upright for food by mouth, and 45 minutes after meals, head flexed slightly forward , swallow at slower rate  UPDATE Oct 16th 2017: I reviewed laboratory in October 2017, immune of fixative electrophoresis showed mild elevated Ig G2.8, otherwise was normal, mild anemia hemoglobin 11.7, mild elevated kappa.free light chain 26, mild elevated creatinine 1.38, normal ferritin 163, normal iron panel, TSH, folic acid, Z32 9924,  Last ivig was in Sept, it has helped him 80%, he can chew and swallow, he continue complains of blurry vision,  UPDATE May 05 2016: He is overall much improved, but still complains of SOB, is at his baseline, last infusion was on Nov 27, 28th, he will have  more infusion in December, he has mild dysarthria, slight dysphagia will see hematologist Dr. Vivianne Spence January 2018,   UPDATE Oct 04 2018: Patient complains of gradual onset swallowing difficulty since April 2020, to the point he has to modify his daily diet, he can only eat soft food, also increased dyspnea upon exertion, has to cut back his daily activity, he denies double vision, no droopy eyelid, denies difficulty to close his eyes tightly, he denies significant limb muscle weakness,  Update December 13, 2018: He is accompanied by his wife at today's clinic visit, he had received IVIG treatment in June, July, planning on receiving it again on Sloan 3to6/2020, he tolerated infusion well, noticed moderate to significant improvement of his muscle  weakness, 80% back to his baseline, he can swallowing better, move better, but continue noticed mild bilateral upper extremity weakness, taking Mestinon 4 times a day, prednisone 10 mg daily  UPDATE July 16 2019: He had IVIG treatment in June, July, Sloan, September 2020, after that, he was able to bounce back to baseline, gradually tapered off prednisone treatment,  He is still taking Mestinon 60 mg 4 times a day, complains of shortness of breath occasionally denied double vision, swallowing difficulty  Update January 17, 2020 SS: Here today by his wife, about 2 to 3 weeks ago, noted more difficulty swallowing, double vision while taking Mestinon 60 mg 4 times a day, he has been off prednisone.  When symptoms restarted, he resumed prednisone 5 mg every other day.  Symptoms have improved, but still notes double vision with distance, trouble swallowing if is not careful.  He has remained active, works outside a lot.   On exam, subjective diplopia with superior gaze within 10 second, moderate cheek puff weakness, difficulty closing eyes tightly, no significant limb muscle weakness. His wife knows him well, need more prednisone or restart IVIG. I saw the patient with Dr.Yan  REVIEW OF SYSTEMS: Out of a complete 14 system review of symptoms, the patient complains only of the following symptoms, and all other reviewed systems are negative.  Double vision  ALLERGIES: Allergies  Allergen Reactions  . Aspirin   . Shellfish Allergy   . Tramadol Nausea Only    HOME MEDICATIONS: Outpatient Medications Prior to Visit  Medication Sig Dispense Refill  . predniSONE (DELTASONE) 5 MG tablet Take 5 mg by mouth as needed.    . pyridostigmine (MESTINON) 60 MG tablet Take 1 tablet (60 mg total) by mouth 3 (three) times daily. 270 tablet 3  . valsartan-hydrochlorothiazide (DIOVAN-HCT) 320-25 MG per tablet Take 1 tablet by mouth daily.    . vitamin B-12 (CYANOCOBALAMIN) 1000 MCG tablet Take 1,000 mcg by  mouth daily.     [name] warfarin (COUMADIN) 5 MG tablet 5 mg. Taking 5mg  Mon, Wed, Fri.  Taking 2.5mg  Sun, Tues, Thurs, Sat.     No facility-administered medications prior to visit.    PAST MEDICAL HISTORY: Past Medical History:  Diagnosis Date  . B12 deficiency   . Essential tremor   . Hypertension   . Memory deficit   . Myasthenia gravis ([place])    since 2004  . Pulmonary emboli ([place])    in Nov 2003  . Skin cancer     PAST SURGICAL HISTORY: Past Surgical History:  Procedure Laterality Date  . CARPAL TUNNEL RELEASE Right 09/14/2012   Procedure: CARPAL TUNNEL RELEASE;  Surgeon: Cammie Sickle., MD;  Location: [place];  Service: Orthopedics;  Laterality: Right;  . CARPAL TUNNEL  RELEASE Left 11/04/2015   Procedure: LEFT CARPAL TUNNEL RELEASE, EXTENDED INCISION;  Surgeon: Daryll Brod, MD;  Location: [place];  Service: Orthopedics;  Laterality: Left;  ANESTHESIA: IV REGIONAL UPPER ARM  . CHOLECYSTECTOMY  2012   [place]  . COLONOSCOPY    . FOOT SURGERY  1966   lt orif foot  . SKIN CANCER EXCISION      FAMILY HISTORY: Family History  Problem Relation Age of Onset  . Heart disease Mother   . Heart disease Father     SOCIAL HISTORY: Social History   Socioeconomic History  . Marital status: Married    Spouse name: [name]  . Number of children: 2  . Years of education: 45  . Highest education level: Not on file  Occupational History  . Occupation: Plant    Comment: Retired  Tobacco Use  . Smoking status: Former Smoker    Quit date: 05/17/1968    Years since quitting: 51.7  . Smokeless tobacco: Never Used  Substance and Sexual Activity  . Alcohol use: No  . Drug use: No  . Sexual activity: Not on file  Other Topics Concern  . Not on file  Social History Narrative   He lives with his wife [name](, has 2 children, retired. Hard of hearing.   Right handed.   Caffeine- None   Social Determinants of Health   Financial Resource Strain:    . Difficulty of Paying Living Expenses: Not on file  Food Insecurity:   . Worried About Charity fundraiser in the Last Year: Not on file  . Ran Out of Food in the Last Year: Not on file  Transportation Needs:   . Lack of Transportation (Medical): Not on file  . Lack of Transportation (Non-Medical): Not on file  Physical Activity:   . Days of Exercise per Week: Not on file  . Minutes of Exercise per Session: Not on file  Stress:   . Feeling of Stress : Not on file  Social Connections:   . Frequency of Communication with Friends and Family: Not on file  . Frequency of Social Gatherings with Friends and Family: Not on file  . Attends Religious Services: Not on file  . Active Member of Clubs or Organizations: Not on file  . Attends Archivist Meetings: Not on file  . Marital Status: Not on file  Intimate Partner Violence:   . Fear of Current or Ex-Partner: Not on file  . Emotionally Abused: Not on file  . Physically Abused: Not on file  . Sexually Abused: Not on file   PHYSICAL EXAM  Vitals:   01/17/20 1000  BP: 124/71  Pulse: (!) 55  Weight: 205 lb 6.4 oz (93.2 kg)  Height: 5\' 9"  (1.753 m)   Body mass index is 30.33 kg/m.  Generalized: Well developed, in no acute distress  Neurological examination  Mentation: Alert oriented to time, place, history taking. Follows all commands speech and language fluent Cranial nerve II-XII: Pupils were equal round reactive to light. Extraocular movements were full, visual field were full on confrontational test.  Moderate eye closure, cheek puff weakness.  Head turning and shoulder shrug  were normal and symmetric.  With superior gaze, reported diplopia within 10 seconds. Motor: No significant muscle weakness was noted Sensory: Sensory testing is intact to soft touch on all 4 extremities. No evidence of extinction is noted.  Coordination: Cerebellar testing reveals good finger-nose-finger and heel-to-shin bilaterally.  Gait and  station: Gait  is slightly wide-based but steady.  Able to stand with arms crossed at chest. Reflexes: Deep tendon reflexes are symmetric and normal bilaterally.   DIAGNOSTIC DATA (LABS, IMAGING, TESTING) - I reviewed patient records, labs, notes, testing and imaging myself where available.  Lab Results  Component Value Date   WBC 4.9 05/24/2018   HGB 14.8 05/24/2018   HCT 43.5 05/24/2018   MCV 91 05/24/2018   PLT 192 05/24/2018      Component Value Date/Time   NA 141 05/24/2018 1354   K 4.9 05/24/2018 1354   CL 99 05/24/2018 1354   CO2 27 05/24/2018 1354   GLUCOSE 160 (H) 05/24/2018 1354   GLUCOSE 134 (H) 10/25/2016 0951   BUN 17 05/24/2018 1354   CREATININE 1.60 (H) 05/24/2018 1354   CALCIUM 9.6 05/24/2018 1354   PROT 6.5 05/24/2018 1354   ALBUMIN 4.4 05/24/2018 1354   AST 25 05/24/2018 1354   ALT 21 05/24/2018 1354   ALKPHOS 56 05/24/2018 1354   BILITOT 0.6 05/24/2018 1354   GFRNONAA 40 (L) 05/24/2018 1354   GFRAA 46 (L) 05/24/2018 1354   No results found for: CHOL, HDL, LDLCALC, LDLDIRECT, TRIG, CHOLHDL No results found for: HGBA1C Lab Results  Component Value Date   VITAMINB12 1,480 (H) 01/21/2016   Lab Results  Component Value Date   TSH 2.010 08/25/2016   ASSESSMENT AND PLAN 86 y.o. year old male  has a past medical history of B12 deficiency, Essential tremor, Hypertension, Memory deficit, Myasthenia gravis ([place]), Pulmonary emboli ([place]), and Skin cancer. here with :  1.  Seropositive myasthenia gravis with exacerbation   -Not a good candidate for long-term immunosuppressive treatment due to recurrent large area of skin cancer  -Responded very well to previous IVIG, initial dose was in September 2017, loading dose of 2g/kg then every 4 weeks 1g/kg, until December 2017  -Recurrent swallowing difficulty April 2020, treated with prednisone 20 mg daily for 2 weeks, then 10 mg daily, continued Mestinon 60 mg 3-4 times a day  -Due to suboptimal response, IVIG was  reinitiated in June, July 2020 loading dose 2 g/kg, followed by 1 g/kg every 28 days for total of 6 treatments, last infusion was in September 2020,-Due to history of shortness of breath, and infusion of 2 g/kg divided into 4 days  -baseline intermittent shortness of breath with exertion, this has been a problem since his PE in 2003  -About 2 to 3 weeks ago, return of trouble swallowing, diplopia, off prednisone, IVIG  -On exam, subjective diplopia reported with superior gaze, moderate cheek puff, eye open weakness, no significant muscle weakness  -Saw the patient with Dr. Krista Sloan will resume IVIG, 0.5 g/KG x 4 days initial loading dose, followed by 0.5 g/KG x 2 days maintenance dose monthly x 3 (total of 4 infusions)  -Continue Mestinon 60 mg 4 times a day, will continue low-dose prednisone 5 mg every other day  -Follow-up in 6 weeks or sooner if needed  I spent 30 minutes of face-to-face and non-face-to-face time with patient. This included previsit chart review, lab review, study review, order entry, electronic health record documentation, patient education.  Calvin Sloan, AGNP-C, DNP 01/17/2020, 10:33 AM [facility] [address] [place],  [phone]

## 2020-01-17 ENCOUNTER — Encounter: Payer: Self-pay | Admitting: Neurology

## 2020-01-17 ENCOUNTER — Telehealth: Payer: Self-pay | Admitting: Neurology

## 2020-01-17 ENCOUNTER — Other Ambulatory Visit: Payer: Self-pay

## 2020-01-17 ENCOUNTER — Ambulatory Visit (INDEPENDENT_AMBULATORY_CARE_PROVIDER_SITE_OTHER): Payer: Medicare Other | Admitting: Neurology

## 2020-01-17 VITALS — BP 124/71 | HR 55 | Ht 69.0 in | Wt 205.4 lb

## 2020-01-17 DIAGNOSIS — G7001 Myasthenia gravis with (acute) exacerbation: Secondary | ICD-10-CM | POA: Diagnosis not present

## 2020-01-17 NOTE — Patient Instructions (Addendum)
Will restart IVIG, will get paperwork started  You can continue prednisone for now See you back in 6 weeks

## 2020-01-17 NOTE — Telephone Encounter (Signed)
Per Judson Roch NP, pt needs follow up in 5-6 weeks with herself or Dr. [name]. Soonest opening available is 11/15 with Dr. [name]. If possible, pt would like to be worked in closer to the 5-6 mark. Best call back # is [phone].

## 2020-01-17 NOTE — Telephone Encounter (Deleted)
Per Judson Roch NP, pt needs to follow up in 5-6 weeks

## 2020-02-04 DIAGNOSIS — G7001 Myasthenia gravis with (acute) exacerbation: Secondary | ICD-10-CM | POA: Diagnosis not present

## 2020-02-05 DIAGNOSIS — G7001 Myasthenia gravis with (acute) exacerbation: Secondary | ICD-10-CM | POA: Diagnosis not present

## 2020-02-06 DIAGNOSIS — G7001 Myasthenia gravis with (acute) exacerbation: Secondary | ICD-10-CM | POA: Diagnosis not present

## 2020-02-07 DIAGNOSIS — G7001 Myasthenia gravis with (acute) exacerbation: Secondary | ICD-10-CM | POA: Diagnosis not present

## 2020-02-15 DIAGNOSIS — H2511 Age-related nuclear cataract, right eye: Secondary | ICD-10-CM | POA: Diagnosis not present

## 2020-02-27 DIAGNOSIS — I4891 Unspecified atrial fibrillation: Secondary | ICD-10-CM | POA: Diagnosis not present

## 2020-03-04 DIAGNOSIS — G7001 Myasthenia gravis with (acute) exacerbation: Secondary | ICD-10-CM | POA: Diagnosis not present

## 2020-03-05 DIAGNOSIS — G7001 Myasthenia gravis with (acute) exacerbation: Secondary | ICD-10-CM | POA: Diagnosis not present

## 2020-03-18 ENCOUNTER — Encounter: Payer: Self-pay | Admitting: Neurology

## 2020-03-18 ENCOUNTER — Telehealth: Payer: Self-pay | Admitting: *Deleted

## 2020-03-18 ENCOUNTER — Ambulatory Visit: Payer: Medicare Other | Admitting: Neurology

## 2020-03-18 NOTE — Telephone Encounter (Signed)
No showed follow up appointment. 

## 2020-03-19 ENCOUNTER — Other Ambulatory Visit: Payer: Self-pay

## 2020-03-19 ENCOUNTER — Encounter: Payer: Self-pay | Admitting: Neurology

## 2020-03-19 ENCOUNTER — Ambulatory Visit (INDEPENDENT_AMBULATORY_CARE_PROVIDER_SITE_OTHER): Payer: Medicare Other | Admitting: Neurology

## 2020-03-19 VITALS — BP 129/58 | HR 58 | Ht 69.0 in | Wt 212.0 lb

## 2020-03-19 DIAGNOSIS — R0600 Dyspnea, unspecified: Secondary | ICD-10-CM | POA: Insufficient documentation

## 2020-03-19 DIAGNOSIS — G7001 Myasthenia gravis with (acute) exacerbation: Secondary | ICD-10-CM | POA: Diagnosis not present

## 2020-03-19 DIAGNOSIS — R1310 Dysphagia, unspecified: Secondary | ICD-10-CM

## 2020-03-19 NOTE — Progress Notes (Signed)
HISTORY OF PRESENT ILLNESS:  Calvin Sloan is a 86 -year-old right-handed white married male with generalized seropositive myasthenia gravis characterized by bulbar weakness, shortness of breath , and dysphagia. Patient of [name]  He was diagnosed in 10/2002 with a positive Tensilon test, positive acetylchoine receptor antibody,and had a positive ANA at 1-160 speckled pattern. Initially he had right eye ptosis and subsequently, double vision, fatigue with chewing, dysphagia, and upper and lower extremity weakness. He was initially treated with Mestinon bromide in June 2004, which was associated with GI side effects.  Prednisone was started in 03/29/2003 and CellCept 05/2003. He developed increased jitteriness, rash, and cramps on CellCept which was discontinued 10/2003. He had progressive bulbar weakness and was admitted 12/2003 for IV IgG therapy which required a PEG tube for dysphagia transiently. He was started on Imuran. 01/17/2004 he was readmitted for 2 days of IV IgG because of shortness of breath. He has not been hospitalized since 01/2004.   He has been slowly tapered off prednisone 09/2007 and was on Imuran and Mestinon bromide.   He had B12 deficiency and was receiving B12 shots ,but B12 levels off injections have been normal and B12 shots were discontinued.  He has a history of vague visual loss on the left of unknown etiology. He denies swallowing problems, speech problems, voice changes, focal weakness, or double vision.  He is independent in activities of daily living and takes one nap per day.He denies memory loss but his wife states that he does have memory loss. His last blood studies in my office 05/27/10 were normal except a low white blood cell count of 3900.  His dermatologist [name] was concerned that the Imuran is associated with increased risk of skin cancer and the imuran was discontinued in 12/30/2010. His skin cancer (basal cell cancer) has improved  since his imuran was stopped.  He has noted no change In myasthenic symptoms. His skin has improved. His medication for myasthenia is mesinon bromide 60 milligrams one and one half tablets 4 times a day. He has shortness of breath since his pulmonary emboli in 2003, also complains of excessive fatigue.   He is allergic to aspirin, GI side effect, gastric ulcer, and is on Plavix for a history of TIAs with right arm weakness He has swallowing problems at the beginning of the meal with liquids that improves as the meal continues.   He had right CTS release in May 2014 by [name].  He complains of shortness of breath with walking, bending over to tire his shoed, due to PE 2003, pulmnolgist folllowed up, he does not like to chew on tough beef, he denies swallowing difficulty, no double vision, no droopy eyelid, no significant bleeding muscle weakness. He is active in yard, shop, threadmill   UPDATE Jan 16th 2015: He is now tapering off Mestinon, there was no significant change in his functional status, he denies double vision, no ptosis, continue complains of shortness of breath with exertion no significant and muscle weakness, he has to wash down his food sometimes, but no significant swelling, or chewing difficulty, no dysarthria  UPDATE July 16th 2015: He is no longer mestinon, he does complains of increased breathing difficulty from prolonged walking, no chewing difficulty, no double vision, no ptosis.  Laboratory continue to demonstrate positive acetylcholine binding, and modulating antibodies. He did reported that his skin cancer has improved after stopping imuran, could not tolerate cellcept per record. Previously he responded to IVIG very well.  UPDATE  Sept 25th 2015: He was stated on prednisone 10mg  qday, and mestinon 60mg  tid in June 2015, which did help his fatigue, shortness of breath, wife reported 50% improvement, he has increased appetite  But he was admitted to  [facility] hospital in Sept 17th for bilateral PEs, right leg DVT, he is started on coumadin, bridge with Lovenox, [name] is monitoring his INR  He denies double vision, no ptosis, no limb muscle weakness, no dysphagia  UPDATE April 4th 2016: He is on coumadin for PE, INR was monitored by [name], his myasthenia gravis is under good control, is taking prednisone 10 mg every day, he has surgery in Nov 2015 for right leg cellulitis.  He only does little around his house, he has no double vision, SOB due to previous PE,   UPDATE Feb 18 2015: Previous laboratory evaluation showed mild B12 deficiency with level of 204, He did receive IM B12 supplement at his primary care Dr. [name] office for a while, now with normal repeat laboratory evaluation, injection has stopped He has mild choking difficulty, this has been going on since August 2016, not getting worse, he has more trouble with liquid. He complains of shortness of breath when bending over. He denies significant gait difficulty, no breathing difficulty otherwise, he denies double vision, no ptosis,, No chewing difficulties   UPDATE May 22 2015: He came in last visit April 16 2015 complains of worsening blurry vision, double vision, swallowing difficulties, trouble talking, he reported 50% weakness compared to baseline, difficulty getting up from seated position, difficulty chewing, worsening shortness of breath, on examination, he was noted to have increased weakness compared to previous examination, he had moderate eye-closure cheek puff, mild neck flexion weakness, mild bilateral shoulder abduction bilateral hip flexion weakness.  He was treated with IVIG 400 mg/kg every day for 5 days from December 11 to May 02 2015, his vision has much improved, he can walk better, but he continue have swallowing difficulty, dysarthria, he is also taking prednisone 40 mg every day, increase his Mestinon 60 mg to 3-4 tablets daily  He was  not able to tolerate immunosuppression treatment in the past due to skin cancer  UPDATE Jul 07 2015:  I have called his wife, pulmonary function test in June 05 2015.  1, FVC is 3.1 5, 87%, this is normal 2. FEV1 is 2.1 6, 77%, with a percent FEV1 of 68, this indicated mild to moderate primary small obstruction in this patient was a history of cigarette smoking, large elderly flow rates are generally normal  3. Flow volume loop is unremarkable 4, lung volumes indicate some evidence of air trapping with an increased residual volume, the TLC is normal, the increased residual volume confirms the presence of airflow obstruction  5, DLCO is 19.0, 90%, this is normal 6, elderly resistant 3.7 2, 265%, this is increased  Wife reported, he recently had swallowing study, there was no significant abnormality found, his swallowing, breathing, symptoms overall has improved  He did think that IVIG treatment in Dec 11-16th 2016 has helped his blurry vision, it is gone, he still feel straggled sometimes, he has shortness with exertion and bending over. His talking is much better.  UPDATE May 22nd 2017: He continue complains of shortness of breath with minimum exertion, no diplopia, no double vision, mild dysphasia,  He had a history of right carpal tunnel release surgery in the past, which has been helpful, now complains of left first 3 finger paresthesia, EMG nerve conduction  studies was planned at local hospital  UPDATE July 6th 2017: He stopped taking Mestinon around May 20 third 2017, shortly afterwards, he noticed double vision, he clearly describe eighth of binocular double vision, sometimes vertigo, sometimes horizontal, he went on higher dose of Mestinon 60 mg 4 times a day without improving his symptoms, he slept on a bed with 30 head raise, with no significant breathing difficulty, he could not eat his dinner on July 5th 2017 due to chewing swallowing difficulty, he could not fly  flat sleeping, mildly unsteady gait, increased fatigue,  UPDATE August 17th 2017: Last IVIG treatment was December 05 2015, he did very well for a while, was able to cut grass, trim his lawn, it did helped him for at least 2 weeks, then he had passing out   I reviewed laboratory evaluation in August 2017, normal B12, ferritin level, iron binding capacity was within normal limit, low hemoglobin 10 point 8, creatinine was elevated 1.53, glucose was 125  Previous laboratory evaluation in 2016 showed normal folic acid 12 point 5,low normal B12 level 266, hemoglobin in January 2015 was 14 point 3  He will be evaluated by hematologist/oncologist Dr. Mervin Kung in September 6th 2017, at [facility]  He complains of low back pain since he fell you August 1st 2017, has been taking intermittent Tylenol,  He complains of difficulty swallowing, both liquid, and solid food, no double vision, He is taking mestinon 60mg  4 times a day, which did help him.  Update January 29 2016: I have reviewed his hematologist Dr. Grier Mitts note on September 6th 2017, normocytic microchromic anemia, normal WBC, platelet count, likely from chronic kidney disease, history of fetal deficiency, is on B12 supplement, there was a consideration of low level hemolytic anemia from IVIG infusion, no overt evidence of blood loss  I reviewed laboratory evaluation in September 2017, parietal cell antibody IgG was negative, intrinsic factor negative, mild elevated kappa free light chain, Hg 13.8, creat 1.38, GFR 47, ferritin 163, normal TSh 1.5, elevated vitamin B12 1480  He complains more fatigue, more swallowing difficulty, decreased po intake, he could not drinking water because worry about the choking episode, increased shortness of breath with exertion, intermittent blurry version, difficulty closing his eyes.  He is now taking mestinon 60mg  qid, no significant side effect, was not sure about the benefit.  Also reviewed  swallowing evaluation on June 06 2015 at [facility], there was mild oropharyngeal dysphagia, characterized by spillover to the valleculae and the piriform sinus secondary to reduced oral motor coordination  patient was felt to safe to consume a regular diet with a full range of liquid with recommendation of upright for food by mouth, and 45 minutes after meals, head flexed slightly forward , swallow at slower rate  UPDATE Oct 16th 2017: I reviewed laboratory in October 2017, immune of fixative electrophoresis showed mild elevated Ig G2.8, otherwise was normal, mild anemia hemoglobin 11.7, mild elevated kappa.free light chain 26, mild elevated creatinine 1.38, normal ferritin 163, normal iron panel, TSH, folic acid, E75 1700,  Last ivig was in Sept, it has helped him 80%, he can chew and swallow, he continue complains of blurry vision,  UPDATE May 05 2016: He is overall much improved, but still complains of SOB, is at his baseline, last infusion was on Nov 27, 28th, he will have more infusion in December, he has mild dysarthria, slight dysphagia will see hematologist Dr. Vivianne Spence January 2018,   UPDATE Oct 04 2018: Patient  complains of gradual onset swallowing difficulty since April 2020, to the point he has to modify his daily diet, he can only eat soft food, also increased dyspnea upon exertion, has to cut back his daily activity, he denies double vision, no droopy eyelid, denies difficulty to close his eyes tightly, he denies significant limb muscle weakness,  Update December 13, 2018: He is accompanied by his wife at today's clinic visit, he had received IVIG treatment in June, July, planning on receiving it again on August 3to6/2020, he tolerated infusion well, noticed moderate to significant improvement of his muscle weakness, 80% back to his baseline, he can swallowing better, move better, but continue noticed mild bilateral upper extremity weakness, taking Mestinon  4 times a day, prednisone 10 mg daily  UPDATE July 16 2019: He had IVIG treatment in June, July, August, September 2020, after that, he was able to bounce back to baseline, gradually tapered off prednisone treatment,  He is still taking Mestinon 60 mg 4 times a day, complains of shortness of breath occasionally denied double vision, swallowing difficulty  UPDATE Mar 19 2020: Because of the increased swallowing difficulty, double vision, he received IVIG treatment loading dose 2 g/kg in September, maintenance dose in October, he reported significant improvement, now close to baseline, gained 7 pounds over the past few months, has third infusion planned in November  He only take Mestinon as needed, no longer taking prednisone,  REVIEW OF SYSTEMS: Out of a complete 14 system review of symptoms, the patient complains only of the following symptoms, and all other reviewed systems are negative.  As above  ALLERGIES: Allergies  Allergen Reactions  . Aspirin   . Shellfish Allergy   . Tramadol Nausea Only    HOME MEDICATIONS: Outpatient Medications Prior to Visit  Medication Sig Dispense Refill  . predniSONE (DELTASONE) 5 MG tablet Take 5 mg by mouth as needed.    . pyridostigmine (MESTINON) 60 MG tablet Take 1 tablet (60 mg total) by mouth 3 (three) times daily. 270 tablet 3  . valsartan-hydrochlorothiazide (DIOVAN-HCT) 320-25 MG per tablet Take 1 tablet by mouth daily.    . vitamin B-12 (CYANOCOBALAMIN) 1000 MCG tablet Take 1,000 mcg by mouth daily.     [name] warfarin (COUMADIN) 5 MG tablet 5 mg. Taking 5mg  Mon, Wed, Fri.  Taking 2.5mg  Sun, Tues, Thurs, Sat.     No facility-administered medications prior to visit.    PAST MEDICAL HISTORY: Past Medical History:  Diagnosis Date  . B12 deficiency   . Essential tremor   . Hypertension   . Memory deficit   . Myasthenia gravis ([place])    since 2004  . Pulmonary emboli ([place])    in Nov 2003  . Skin cancer     PAST SURGICAL  HISTORY: Past Surgical History:  Procedure Laterality Date  . CARPAL TUNNEL RELEASE Right 09/14/2012   Procedure: CARPAL TUNNEL RELEASE;  Surgeon: Cammie Sickle., MD;  Location: [place];  Service: Orthopedics;  Laterality: Right;  . CARPAL TUNNEL RELEASE Left 11/04/2015   Procedure: LEFT CARPAL TUNNEL RELEASE, EXTENDED INCISION;  Surgeon: Daryll Brod, MD;  Location: [place];  Service: Orthopedics;  Laterality: Left;  ANESTHESIA: IV REGIONAL UPPER ARM  . CATARACT EXTRACTION Right   . CHOLECYSTECTOMY  2012   [place]  . COLONOSCOPY    . FOOT SURGERY  1966   lt orif foot  . SKIN CANCER EXCISION      FAMILY HISTORY: Family History  Problem  Relation Age of Onset  . Heart disease Mother   . Heart disease Father     SOCIAL HISTORY: Social History   Socioeconomic History  . Marital status: Married    Spouse name: [name]  . Number of children: 2  . Years of education: 61  . Highest education level: Not on file  Occupational History  . Occupation: Plant    Comment: Retired  Tobacco Use  . Smoking status: Former Smoker    Quit date: 05/17/1968    Years since quitting: 51.8  . Smokeless tobacco: Never Used  Substance and Sexual Activity  . Alcohol use: No  . Drug use: No  . Sexual activity: Not on file  Other Topics Concern  . Not on file  Social History Narrative   He lives with his wife [name](, has 2 children, retired. Hard of hearing.   Right handed.   Caffeine- None   Social Determinants of Health   Financial Resource Strain:   . Difficulty of Paying Living Expenses: Not on file  Food Insecurity:   . Worried About Charity fundraiser in the Last Year: Not on file  . Ran Out of Food in the Last Year: Not on file  Transportation Needs:   . Lack of Transportation (Medical): Not on file  . Lack of Transportation (Non-Medical): Not on file  Physical Activity:   . Days of Exercise per Week: Not on file  . Minutes of Exercise per  Session: Not on file  Stress:   . Feeling of Stress : Not on file  Social Connections:   . Frequency of Communication with Friends and Family: Not on file  . Frequency of Social Gatherings with Friends and Family: Not on file  . Attends Religious Services: Not on file  . Active Member of Clubs or Organizations: Not on file  . Attends Archivist Meetings: Not on file  . Marital Status: Not on file  Intimate Partner Violence:   . Fear of Current or Ex-Partner: Not on file  . Emotionally Abused: Not on file  . Physically Abused: Not on file  . Sexually Abused: Not on file   PHYSICAL EXAM  Vitals:   03/19/20 1037  BP: (!) 129/58  Pulse: (!) 58  Weight: 212 lb (96.2 kg)  Height: 5\' 9"  (1.753 m)   Body mass index is 31.31 kg/m.   PHYSICAL EXAMNIATION:  Gen: NAD, conversant, well nourised, well groomed                     Cardiovascular: Regular rate rhythm, no peripheral edema, warm, nontender. Eyes: Conjunctivae clear without exudates or hemorrhage Neck: Supple, no carotid bruits. Pulmonary: Clear to auscultation bilaterally   NEUROLOGICAL EXAM:  MENTAL STATUS: Speech/Cognition: Awake, alert, normal speech, oriented to history taking and casual conversation.  CRANIAL NERVES: CN II: Visual fields are full to confrontation.  Pupils are round equal and briskly reactive to light. CN III, IV, VI: extraocular movement are normal. No ptosis. CN V: Facial sensation is intact to light touch. CN VII: Mild eye closure, cheek puff weakness CN VIII: Hearing is normal to casual conversation CN IX, X: Palate elevates symmetrically. Phonation is normal. CN XI: Head turning and shoulder shrug are intact  MOTOR: Muscle bulk and tone are normal. Muscle strength is normal.  REFLEXES: Reflexes are 1 and symmetric at the biceps, triceps, knees and ankles. Plantar responses are flexor.  SENSORY: Intact to light touch, pinprick, positional and vibratory sensation  at fingers  and toes.  COORDINATION: There is no trunk or limb ataxia.    GAIT/STANCE: Get up from seated position arm crossed, gait is steady with normal steps, base, arm swing and turning.   DIAGNOSTIC DATA (LABS, IMAGING, TESTING) - I reviewed patient records, labs, notes, testing and imaging myself where available.  Lab Results  Component Value Date   WBC 4.9 05/24/2018   HGB 14.8 05/24/2018   HCT 43.5 05/24/2018   MCV 91 05/24/2018   PLT 192 05/24/2018      Component Value Date/Time   NA 141 05/24/2018 1354   K 4.9 05/24/2018 1354   CL 99 05/24/2018 1354   CO2 27 05/24/2018 1354   GLUCOSE 160 (H) 05/24/2018 1354   GLUCOSE 134 (H) 10/25/2016 0951   BUN 17 05/24/2018 1354   CREATININE 1.60 (H) 05/24/2018 1354   CALCIUM 9.6 05/24/2018 1354   PROT 6.5 05/24/2018 1354   ALBUMIN 4.4 05/24/2018 1354   AST 25 05/24/2018 1354   ALT 21 05/24/2018 1354   ALKPHOS 56 05/24/2018 1354   BILITOT 0.6 05/24/2018 1354   GFRNONAA 40 (L) 05/24/2018 1354   GFRAA 46 (L) 05/24/2018 1354   No results found for: CHOL, HDL, LDLCALC, LDLDIRECT, TRIG, CHOLHDL No results found for: HGBA1C Lab Results  Component Value Date   VITAMINB12 1,480 (H) 01/21/2016   Lab Results  Component Value Date   TSH 2.010 08/25/2016   ASSESSMENT AND PLAN 86 y.o. year old male   Seropositive myasthenia gravis with exacerbation    Not a good candidate for long-term immunosuppressive treatment due to recurrent large area of skin cancer  Responded very well to previous IVIG   Recurrent swallowing difficulty April 2020, treated with prednisone 20 mg daily for 2 weeks, then 10 mg daily, continued Mestinon 60 mg 3-4 times a day  Due to suboptimal response, IVIG was reinitiate possible sweats d in June, July 2020 loading dose 2 g/kg, followed by 1 g/kg every 28 days for total of 6 treatments, last infusion was in September 2020  Recurrent or worsening swallowing difficulty, double vision, again responding well to IVIG  treatment in September 2021 loading dose 2 g/kg, and maintenance dose 1 g/kg October 2021  Shortness of breath  Baseline intermittent shortness of breath with exertion, this has been a problem since his PE in 2003    pulmonary function test in June 05 2015. 1, FVC is 3.1 5, 87%, this is normal 2. FEV1 is 2.1 6, 77%, with a percent FEV1 of 68, this indicated mild to moderate primary small obstruction in this patient was a history of cigarette smoking, large elderly flow rates are generally normal  3. Flow volume loop is unremarkable 4, lung volumes indicate some evidence of air trapping with an increased residual volume, the TLC is normal, the increased residual volume confirms the presence of airflow obstruction 5, DLCO is 19.0, 90%, this is normal 6, elderly resistant 3.7 2, 265%, this is increased  Marcial Pacas, M.D. Ph.D.  [facility] [address], [place] [address] Phone: [phone] Fax:      [phone]

## 2020-03-20 DIAGNOSIS — I4891 Unspecified atrial fibrillation: Secondary | ICD-10-CM | POA: Diagnosis not present

## 2020-03-27 DIAGNOSIS — M1732 Unilateral post-traumatic osteoarthritis, left knee: Secondary | ICD-10-CM | POA: Diagnosis not present

## 2020-03-31 ENCOUNTER — Ambulatory Visit: Payer: Medicare Other | Admitting: Neurology

## 2020-04-01 DIAGNOSIS — G7001 Myasthenia gravis with (acute) exacerbation: Secondary | ICD-10-CM | POA: Diagnosis not present

## 2020-04-02 DIAGNOSIS — G7001 Myasthenia gravis with (acute) exacerbation: Secondary | ICD-10-CM | POA: Diagnosis not present

## 2020-04-03 DIAGNOSIS — I4891 Unspecified atrial fibrillation: Secondary | ICD-10-CM | POA: Diagnosis not present

## 2020-04-07 NOTE — Progress Notes (Signed)
I have reviewed and agreed above plan. 

## 2020-04-09 DIAGNOSIS — I4891 Unspecified atrial fibrillation: Secondary | ICD-10-CM | POA: Diagnosis not present

## 2020-04-16 DIAGNOSIS — I82401 Acute embolism and thrombosis of unspecified deep veins of right lower extremity: Secondary | ICD-10-CM | POA: Diagnosis not present

## 2020-04-16 DIAGNOSIS — I4891 Unspecified atrial fibrillation: Secondary | ICD-10-CM | POA: Diagnosis not present

## 2020-04-22 ENCOUNTER — Telehealth: Payer: Self-pay | Admitting: Neurology

## 2020-04-22 MED ORDER — PYRIDOSTIGMINE BROMIDE 60 MG PO TABS: 60.0000 mg | ORAL_TABLET | Freq: Four times a day (QID) | ORAL | 3 refills | Status: DC

## 2020-04-22 NOTE — Telephone Encounter (Signed)
Per vo by Dr. [name], okay to provide the requested prescription. Rx sent to [facility].

## 2020-04-22 NOTE — Telephone Encounter (Signed)
Dr. [name] decreased his pyridostigmine (MESTINON) 60 MG tablet . Patient wife is saying she thinks he needs to go back up . Patient's wife states  if RX dose is changed [employer] mail order will need to be contacted.   [place]

## 2020-04-22 NOTE — Telephone Encounter (Signed)
I returned the call to the patient's wife. Reports he has been taking Mestinon 60mg , one tablet four times daily. Feels his symptoms are better controlled at this dosage. His current prescription has instructions for only three times daily. Requesting a new 90-day rx be sent to [facility] mail order pharmacy for the higher quantity.

## 2020-04-22 NOTE — Addendum Note (Signed)
Addended by: Noberto Retort C on: 04/22/2020 03:06 PM   Modules accepted: Orders

## 2020-04-29 DIAGNOSIS — G7001 Myasthenia gravis with (acute) exacerbation: Secondary | ICD-10-CM | POA: Diagnosis not present

## 2020-04-30 DIAGNOSIS — G7001 Myasthenia gravis with (acute) exacerbation: Secondary | ICD-10-CM | POA: Diagnosis not present

## 2020-05-02 DIAGNOSIS — I4891 Unspecified atrial fibrillation: Secondary | ICD-10-CM | POA: Diagnosis not present

## 2020-06-11 DIAGNOSIS — H02122 Mechanical ectropion of right lower eyelid: Secondary | ICD-10-CM | POA: Diagnosis not present

## 2020-06-11 DIAGNOSIS — H01022 Squamous blepharitis right lower eyelid: Secondary | ICD-10-CM | POA: Diagnosis not present

## 2020-06-19 DIAGNOSIS — I4891 Unspecified atrial fibrillation: Secondary | ICD-10-CM | POA: Diagnosis not present

## 2020-06-27 DIAGNOSIS — M1732 Unilateral post-traumatic osteoarthritis, left knee: Secondary | ICD-10-CM | POA: Diagnosis not present

## 2020-07-15 DIAGNOSIS — L718 Other rosacea: Secondary | ICD-10-CM | POA: Diagnosis not present

## 2020-07-15 DIAGNOSIS — L814 Other melanin hyperpigmentation: Secondary | ICD-10-CM | POA: Diagnosis not present

## 2020-07-15 DIAGNOSIS — D2239 Melanocytic nevi of other parts of face: Secondary | ICD-10-CM | POA: Diagnosis not present

## 2020-07-15 DIAGNOSIS — L570 Actinic keratosis: Secondary | ICD-10-CM | POA: Diagnosis not present

## 2020-07-17 DIAGNOSIS — I4891 Unspecified atrial fibrillation: Secondary | ICD-10-CM | POA: Diagnosis not present

## 2020-08-12 DIAGNOSIS — L718 Other rosacea: Secondary | ICD-10-CM | POA: Diagnosis not present

## 2020-08-12 DIAGNOSIS — D2239 Melanocytic nevi of other parts of face: Secondary | ICD-10-CM | POA: Diagnosis not present

## 2020-08-12 DIAGNOSIS — L570 Actinic keratosis: Secondary | ICD-10-CM | POA: Diagnosis not present

## 2020-08-18 DIAGNOSIS — I4891 Unspecified atrial fibrillation: Secondary | ICD-10-CM | POA: Diagnosis not present

## 2020-08-29 ENCOUNTER — Telehealth: Payer: Self-pay | Admitting: Neurology

## 2020-08-29 MED ORDER — DEXAMETHASONE 2 MG PO TABS: ORAL_TABLET | ORAL | 0 refills | Status: DC

## 2020-08-29 NOTE — Telephone Encounter (Signed)
I called the patient, talk with the wife.  The patient has myasthenia gravis, he has had this for a number of years and around the time of diagnosis in 2004 he did require ventilator assistance.  He has had problems with pharyngeal weakness and occasionally will require 2-day courses of IVIG.  He has not been vaccinated for the Covid virus.  He tested positive for the Covid yesterday.  As far as the wife knows, he has not run fevers but she has not checked his temperature today, he was afebrile yesterday.  The patient is having increasing problems with swallowing, he is coughing up some brownish phlegm.  They are to increase the Mestinon to 1.5 tablets before meals, I will start dexamethasone.  She is to check the temperature, if he is running a fever, they are to aggressively treat this with Tylenol.  I have indicated that this is a high risk situation, if it appears throughout the day or throughout the weekend that he is deteriorating at all, I have urged her to call 911 and go to the emergency room immediately as he may require IV treatments for the Covid virus and IVIG or plasmapheresis for the myasthenia gravis.  She is to monitor him closely.  With feeding, he is to eat soft pudding consistency foods.  So far, he is able to get down his oral medications.

## 2020-09-01 NOTE — Telephone Encounter (Signed)
I spoke to Calvin Sloan. They were both positive for Covid. She is feeling better - she did not have too rough of a time with it. States Calvin Sloan is starting to feel much better. He is on day three of the dexamethasone tapering prescription. He is taking 1.5 tablets of Mestinon 60mg , four times daily. His swallowing has started to improve. He still has some issues clearing thick phlegm but is using Mucinex that seems to be helpful. He is able to swallow his medication and food. He has not ran a fever at all.

## 2020-09-01 NOTE — Telephone Encounter (Signed)
Please call to check on him, and also the medications he is taking now

## 2020-09-01 NOTE — Addendum Note (Signed)
Addended by: Noberto Retort C on: 09/01/2020 05:07 PM   Modules accepted: Orders

## 2020-09-11 DIAGNOSIS — L570 Actinic keratosis: Secondary | ICD-10-CM | POA: Diagnosis not present

## 2020-09-11 DIAGNOSIS — L218 Other seborrheic dermatitis: Secondary | ICD-10-CM | POA: Diagnosis not present

## 2020-09-11 DIAGNOSIS — L718 Other rosacea: Secondary | ICD-10-CM | POA: Diagnosis not present

## 2020-09-16 ENCOUNTER — Ambulatory Visit (INDEPENDENT_AMBULATORY_CARE_PROVIDER_SITE_OTHER): Payer: Medicare Other | Admitting: Neurology

## 2020-09-16 ENCOUNTER — Other Ambulatory Visit: Payer: Self-pay

## 2020-09-16 ENCOUNTER — Encounter: Payer: Self-pay | Admitting: Neurology

## 2020-09-16 VITALS — BP 147/74 | HR 59 | Ht 69.0 in | Wt 210.0 lb

## 2020-09-16 DIAGNOSIS — U071 COVID-19: Secondary | ICD-10-CM

## 2020-09-16 DIAGNOSIS — G7001 Myasthenia gravis with (acute) exacerbation: Secondary | ICD-10-CM

## 2020-09-16 NOTE — Progress Notes (Signed)
HISTORY OF PRESENT ILLNESS:  Calvin Sloan is a 87 -year-old right-handed white married male with generalized seropositive myasthenia gravis characterized by bulbar weakness, shortness of breath , and dysphagia. Patient of [name]  He was diagnosed in 10/2002 with a positive Tensilon test, positive acetylchoine receptor antibody,and had a positive ANA at 1-160 speckled pattern. Initially he had right eye ptosis and subsequently, double vision, fatigue with chewing, dysphagia, and upper and lower extremity weakness. He was initially treated with Mestinon bromide in June 2004, which was associated with GI side effects.  Prednisone was started in 03/29/2003 and CellCept 05/2003. He developed increased jitteriness, rash, and cramps on CellCept which was discontinued 10/2003. He had progressive bulbar weakness and was admitted 12/2003 for IV IgG therapy which required a PEG tube for dysphagia transiently. He was started on Imuran. 01/17/2004 he was readmitted for 2 days of IV IgG because of shortness of breath. He has not been hospitalized since 01/2004.   He has been slowly tapered off prednisone 09/2007 and was on Imuran and Mestinon bromide.   He had B12 deficiency and was receiving B12 shots ,but B12 levels off injections have been normal and B12 shots were discontinued.  He has a history of vague visual loss on the left of unknown etiology. He denies swallowing problems, speech problems, voice changes, focal weakness, or double vision.  He is independent in activities of daily living and takes one nap per day.He denies memory loss but his wife states that he does have memory loss. His last blood studies in my office 05/27/10 were normal except a low white blood cell count of 3900.  His dermatologist [name] was concerned that the Imuran is associated with increased risk of skin cancer and the imuran was discontinued in 12/30/2010. His skin cancer (basal cell cancer) has improved  since his imuran was stopped.  He has noted no change In myasthenic symptoms. His skin has improved. His medication for myasthenia is mesinon bromide 60 milligrams one and one half tablets 4 times a day. He has shortness of breath since his pulmonary emboli in 2003, also complains of excessive fatigue.   He is allergic to aspirin, GI side effect, gastric ulcer, and is on Plavix for a history of TIAs with right arm weakness He has swallowing problems at the beginning of the meal with liquids that improves as the meal continues.   He had right CTS release in May 2014 by [name].  He complains of shortness of breath with walking, bending over to tire his shoed, due to PE 2003, pulmnolgist folllowed up, he does not like to chew on tough beef, he denies swallowing difficulty, no double vision, no droopy eyelid, no significant bleeding muscle weakness. He is active in yard, shop, threadmill   UPDATE Jan 16th 2015: He is now tapering off Mestinon, there was no significant change in his functional status, he denies double vision, no ptosis, continue complains of shortness of breath with exertion no significant and muscle weakness, he has to wash down his food sometimes, but no significant swelling, or chewing difficulty, no dysarthria  UPDATE July 16th 2015: He is no longer mestinon, he does complains of increased breathing difficulty from prolonged walking, no chewing difficulty, no double vision, no ptosis.  Laboratory continue to demonstrate positive acetylcholine binding, and modulating antibodies. He did reported that his skin cancer has improved after stopping imuran, could not tolerate cellcept per record. Previously he responded to IVIG very well.  UPDATE  Sept 25th 2015: He was stated on prednisone 10mg  qday, and mestinon 60mg  tid in June 2015, which did help his fatigue, shortness of breath, wife reported 50% improvement, he has increased appetite  But he was admitted to  [facility] hospital in Sept 17th for bilateral PEs, right leg DVT, he is started on coumadin, bridge with Lovenox, [name] is monitoring his INR  He denies double vision, no ptosis, no limb muscle weakness, no dysphagia  UPDATE April 4th 2016: He is on coumadin for PE, INR was monitored by [name], his myasthenia gravis is under good control, is taking prednisone 10 mg every day, he has surgery in Nov 2015 for right leg cellulitis.  He only does little around his house, he has no double vision, SOB due to previous PE,   UPDATE Feb 18 2015: Previous laboratory evaluation showed mild B12 deficiency with level of 204, He did receive IM B12 supplement at his primary care Dr. [name] office for a while, now with normal repeat laboratory evaluation, injection has stopped He has mild choking difficulty, this has been going on since August 2016, not getting worse, he has more trouble with liquid. He complains of shortness of breath when bending over. He denies significant gait difficulty, no breathing difficulty otherwise, he denies double vision, no ptosis,, No chewing difficulties   UPDATE May 22 2015: He came in last visit April 16 2015 complains of worsening blurry vision, double vision, swallowing difficulties, trouble talking, he reported 50% weakness compared to baseline, difficulty getting up from seated position, difficulty chewing, worsening shortness of breath, on examination, he was noted to have increased weakness compared to previous examination, he had moderate eye-closure cheek puff, mild neck flexion weakness, mild bilateral shoulder abduction bilateral hip flexion weakness.  He was treated with IVIG 400 mg/kg every day for 5 days from December 11 to May 02 2015, his vision has much improved, he can walk better, but he continue have swallowing difficulty, dysarthria, he is also taking prednisone 40 mg every day, increase his Mestinon 60 mg to 3-4 tablets daily  He was  not able to tolerate immunosuppression treatment in the past due to skin cancer  UPDATE Jul 07 2015:  I have called his wife, pulmonary function test in June 05 2015.  1, FVC is 3.1 5, 87%, this is normal 2. FEV1 is 2.1 6, 77%, with a percent FEV1 of 68, this indicated mild to moderate primary small obstruction in this patient was a history of cigarette smoking, large elderly flow rates are generally normal  3. Flow volume loop is unremarkable 4, lung volumes indicate some evidence of air trapping with an increased residual volume, the TLC is normal, the increased residual volume confirms the presence of airflow obstruction  5, DLCO is 19.0, 90%, this is normal 6, elderly resistant 3.7 2, 265%, this is increased  Wife reported, he recently had swallowing study, there was no significant abnormality found, his swallowing, breathing, symptoms overall has improved  He did think that IVIG treatment in Dec 11-16th 2016 has helped his blurry vision, it is gone, he still feel straggled sometimes, he has shortness with exertion and bending over. His talking is much better.  UPDATE May 22nd 2017: He continue complains of shortness of breath with minimum exertion, no diplopia, no double vision, mild dysphasia,  He had a history of right carpal tunnel release surgery in the past, which has been helpful, now complains of left first 3 finger paresthesia, EMG nerve conduction  studies was planned at local hospital  UPDATE July 6th 2017: He stopped taking Mestinon around May 20 third 2017, shortly afterwards, he noticed double vision, he clearly describe eighth of binocular double vision, sometimes vertigo, sometimes horizontal, he went on higher dose of Mestinon 60 mg 4 times a day without improving his symptoms, he slept on a bed with 30 head raise, with no significant breathing difficulty, he could not eat his dinner on July 5th 2017 due to chewing swallowing difficulty, he could not fly  flat sleeping, mildly unsteady gait, increased fatigue,  UPDATE August 17th 2017: Last IVIG treatment was December 05 2015, he did very well for a while, was able to cut grass, trim his lawn, it did helped him for at least 2 weeks, then he had passing out   I reviewed laboratory evaluation in August 2017, normal B12, ferritin level, iron binding capacity was within normal limit, low hemoglobin 10 point 8, creatinine was elevated 1.53, glucose was 125  Previous laboratory evaluation in 2016 showed normal folic acid 12 point 5,low normal B12 level 266, hemoglobin in January 2015 was 14 point 3  He will be evaluated by hematologist/oncologist Dr. Mervin Kung in September 6th 2017, at [facility]  He complains of low back pain since he fell you August 1st 2017, has been taking intermittent Tylenol,  He complains of difficulty swallowing, both liquid, and solid food, no double vision, He is taking mestinon 60mg  4 times a day, which did help him.  Update January 29 2016: I have reviewed his hematologist Dr. Grier Mitts note on September 6th 2017, normocytic microchromic anemia, normal WBC, platelet count, likely from chronic kidney disease, history of fetal deficiency, is on B12 supplement, there was a consideration of low level hemolytic anemia from IVIG infusion, no overt evidence of blood loss  I reviewed laboratory evaluation in September 2017, parietal cell antibody IgG was negative, intrinsic factor negative, mild elevated kappa free light chain, Hg 13.8, creat 1.38, GFR 47, ferritin 163, normal TSh 1.5, elevated vitamin B12 1480  He complains more fatigue, more swallowing difficulty, decreased po intake, he could not drinking water because worry about the choking episode, increased shortness of breath with exertion, intermittent blurry version, difficulty closing his eyes.  He is now taking mestinon 60mg  qid, no significant side effect, was not sure about the benefit.  Also reviewed  swallowing evaluation on June 06 2015 at [facility], there was mild oropharyngeal dysphagia, characterized by spillover to the valleculae and the piriform sinus secondary to reduced oral motor coordination  patient was felt to safe to consume a regular diet with a full range of liquid with recommendation of upright for food by mouth, and 45 minutes after meals, head flexed slightly forward , swallow at slower rate  UPDATE Oct 16th 2017: I reviewed laboratory in October 2017, immune of fixative electrophoresis showed mild elevated Ig G2.8, otherwise was normal, mild anemia hemoglobin 11.7, mild elevated kappa.free light chain 26, mild elevated creatinine 1.38, normal ferritin 163, normal iron panel, TSH, folic acid, E75 1700,  Last ivig was in Sept, it has helped him 80%, he can chew and swallow, he continue complains of blurry vision,  UPDATE May 05 2016: He is overall much improved, but still complains of SOB, is at his baseline, last infusion was on Nov 27, 28th, he will have more infusion in December, he has mild dysarthria, slight dysphagia will see hematologist Dr. Vivianne Spence January 2018,   UPDATE Oct 04 2018: Patient  complains of gradual onset swallowing difficulty since April 2020, to the point he has to modify his daily diet, he can only eat soft food, also increased dyspnea upon exertion, has to cut back his daily activity, he denies double vision, no droopy eyelid, denies difficulty to close his eyes tightly, he denies significant limb muscle weakness,  Update December 13, 2018: He is accompanied by his wife at today's clinic visit, he had received IVIG treatment in June, July, planning on receiving it again on August 3to6/2020, he tolerated infusion well, noticed moderate to significant improvement of his muscle weakness, 80% back to his baseline, he can swallowing better, move better, but continue noticed mild bilateral upper extremity weakness, taking Mestinon  4 times a day, prednisone 10 mg daily  UPDATE July 16 2019: He had IVIG treatment in June, July, August, September 2020, after that, he was able to bounce back to baseline, gradually tapered off prednisone treatment,  He is still taking Mestinon 60 mg 4 times a day, complains of shortness of breath occasionally denied double vision, swallowing difficulty  UPDATE Mar 19 2020: Because of the increased swallowing difficulty, double vision, he received IVIG treatment loading dose 2 g/kg in September, maintenance dose in October, he reported significant improvement, now close to baseline, gained 7 pounds over the past few months, has third infusion planned in November  He only take Mestinon as needed, no longer taking prednisone,  Update Sep 16, 2020 SS: Tested positive for COVID on 08/28/20, coughing, stomach ache. Felt poorly up until this week. No fever. Had more problems with swallowing.Taking higher dose Mestinon 1.5 tablets before meals, is somewhat better, less coughing. Coughing up phlegm, pink color. Seeing primary care next week. Finished Decadron taper after 1 week. Wife feels weaker in legs, arms since COVID, eats really slow. Hasn't been able to plant garden, has had stomach ache, may be from extra Mestinon. Last IVIG was Dec 2021 (2 days), had 2 sessions in Sept-Dec.  No double vision. I saw the patient and his wife with Dr. [name].   REVIEW OF SYSTEMS: Out of a complete 14 system review of symptoms, the patient complains only of the following symptoms, and all other reviewed systems are negative.  See HPI  ALLERGIES: Allergies  Allergen Reactions  . Aspirin   . Shellfish Allergy   . Tramadol Nausea Only    HOME MEDICATIONS: Outpatient Medications Prior to Visit  Medication Sig Dispense Refill  . dexamethasone (DECADRON) 2 MG tablet 3 tablets daily for 2 days, 2 tablets daily for 2 days, 1 tablet daily for 2 days, then stop 12 tablet 0  . pyridostigmine (MESTINON) 60 MG tablet Take  1 tablet (60 mg total) by mouth in the morning, at noon, in the evening, and at bedtime. 360 tablet 3  . valsartan-hydrochlorothiazide (DIOVAN-HCT) 320-25 MG per tablet Take 1 tablet by mouth daily.    . vitamin B-12 (CYANOCOBALAMIN) 1000 MCG tablet Take 1,000 mcg by mouth daily.     [name] warfarin (COUMADIN) 5 MG tablet 5 mg. Taking 5mg  Mon, Wed, Fri.  Taking 2.5mg  Sun, Tues, Thurs, Sat.     No facility-administered medications prior to visit.    PAST MEDICAL HISTORY: Past Medical History:  Diagnosis Date  . B12 deficiency   . Essential tremor   . Hypertension   . Memory deficit   . Myasthenia gravis ([place])    since 2004  . Pulmonary emboli ([place])    in Nov 2003  . Skin cancer  PAST SURGICAL HISTORY: Past Surgical History:  Procedure Laterality Date  . CARPAL TUNNEL RELEASE Right 09/14/2012   Procedure: CARPAL TUNNEL RELEASE;  Surgeon: Cammie Sickle., MD;  Location: [facility];  Service: Orthopedics;  Laterality: Right;  . CARPAL TUNNEL RELEASE Left 11/04/2015   Procedure: LEFT CARPAL TUNNEL RELEASE, EXTENDED INCISION;  Surgeon: Daryll Brod, MD;  Location: [facility];  Service: Orthopedics;  Laterality: Left;  ANESTHESIA: IV REGIONAL UPPER ARM  . CATARACT EXTRACTION Right   . CHOLECYSTECTOMY  2012   [facility]  . COLONOSCOPY    . FOOT SURGERY  1966   lt orif foot  . SKIN CANCER EXCISION      FAMILY HISTORY: Family History  Problem Relation Age of Onset  . Heart disease Mother   . Heart disease Father     SOCIAL HISTORY: Social History   Socioeconomic History  . Marital status: Married    Spouse name: [name]  . Number of children: 2  . Years of education: 14  . Highest education level: Not on file  Occupational History  . Occupation: Plant    Comment: Retired  Tobacco Use  . Smoking status: Former Smoker    Quit date: 05/17/1968    Years since quitting: 52.3  . Smokeless tobacco: Never Used  Substance and Sexual Activity  . Alcohol  use: No  . Drug use: No  . Sexual activity: Not on file  Other Topics Concern  . Not on file  Social History Narrative   He lives with his wife [name](, has 2 children, retired. Hard of hearing.   Right handed.   Caffeine- None   Social Determinants of Health   Financial Resource Strain: Not on file  Food Insecurity: Not on file  Transportation Needs: Not on file  Physical Activity: Not on file  Stress: Not on file  Social Connections: Not on file  Intimate Partner Violence: Not on file   PHYSICAL EXAM  Vitals:   09/16/20 1122  BP: (!) 147/74  Pulse: (!) 59  Weight: 210 lb (95.3 kg)  Height: 5\' 9"  (1.753 m)   Body mass index is 31.01 kg/m.  PHYSICAL EXAMNIATION:  Gen: NAD, conversant, well nourised, well groomed                     Cardiovascular: Regular rate rhythm, no peripheral edema, warm, nontender. Eyes: Conjunctivae clear without exudates or hemorrhage Pulmonary: Clear to auscultation bilaterally   NEUROLOGICAL EXAM:  MENTAL STATUS: Speech/Cognition: Awake, alert, normal speech, oriented to history taking and casual conversation.  CRANIAL NERVES: CN II: Visual fields are full to confrontation.  Pupils are round equal and briskly reactive to light. CN III, IV, VI: extraocular movement are normal. No ptosis. CN V: Facial sensation is intact to light touch. CN VII: Mild eye closure, cheek puff weakness CN VIII: Hearing is normal to casual conversation CN XI: Head turning and shoulder shrug are intact  MOTOR: Good strength throughout, 4/5 upper extremities against resistance  REFLEXES: Reflexes are 1+ throughout  SENSORY: Intact to light touch  COORDINATION: Finger-nose-finger and heel-to-shin is normal  GAIT/STANCE: Get up from seated position arm crossed rocking on the 3rd try, gait is slightly wide-based, but steady, independent  DIAGNOSTIC DATA (LABS, IMAGING, TESTING) - I reviewed patient records, labs, notes, testing and imaging myself  where available.  Lab Results  Component Value Date   WBC 4.9 05/24/2018   HGB 14.8 05/24/2018   HCT 43.5 05/24/2018  MCV 91 05/24/2018   PLT 192 05/24/2018      Component Value Date/Time   NA 141 05/24/2018 1354   K 4.9 05/24/2018 1354   CL 99 05/24/2018 1354   CO2 27 05/24/2018 1354   GLUCOSE 160 (H) 05/24/2018 1354   GLUCOSE 134 (H) 10/25/2016 0951   BUN 17 05/24/2018 1354   CREATININE 1.60 (H) 05/24/2018 1354   CALCIUM 9.6 05/24/2018 1354   PROT 6.5 05/24/2018 1354   ALBUMIN 4.4 05/24/2018 1354   AST 25 05/24/2018 1354   ALT 21 05/24/2018 1354   ALKPHOS 56 05/24/2018 1354   BILITOT 0.6 05/24/2018 1354   GFRNONAA 40 (L) 05/24/2018 1354   GFRAA 46 (L) 05/24/2018 1354   No results found for: CHOL, HDL, LDLCALC, LDLDIRECT, TRIG, CHOLHDL No results found for: HGBA1C Lab Results  Component Value Date   VITAMINB12 1,480 (H) 01/21/2016   Lab Results  Component Value Date   TSH 2.010 08/25/2016   ASSESSMENT AND PLAN 87 y.o. year old male   1. Seropositive myasthenia gravis with exacerbation, recent positive COVID diagnosis 08/28/2020 -With recent COVID diagnosis, increased difficulty with swallowing, general weakness -Was treated with 6-day Decadron taper -Will decrease Mestinon 60 mg tablet, no more than 4 tablets daily, taken before meals to help with swallowing, taking 1.5 tablets may be contributing to stomach upset -Referral to [facility] treatment clinic, given high risk status with underlying MG, worsening symptoms since positive testing -If no treatment options are available, will initiate IVIG, last was in Dec 2021, seeing PCP next week -Call for worsening symptoms, follow-up in 2 months or sooner if needed  -Not a good candidate for long-term immunosuppressive treatment due to recurrent large area of skin cancer -Responded very well to previous IVIG  -Recurrent swallowing difficulty April 2020, treated with prednisone 20 mg daily for 2 weeks, then 10 mg daily,  continued Mestinon 60 mg 3-4 times a day -Recurrent or worsening swallowing difficulty, double vision, again responding well to IVIG treatment in September 2021 loading dose 2 g/kg, and maintenance dose 1 g/kg October 2021  Shortness of breath  Baseline intermittent shortness of breath with exertion, this has been a problem since his PE in 2003    pulmonary function test in June 05 2015. 1, FVC is 3.1 5, 87%, this is normal 2. FEV1 is 2.1 6, 77%, with a percent FEV1 of 68, this indicated mild to moderate primary small obstruction in this patient was a history of cigarette smoking, large elderly flow rates are generally normal  3. Flow volume loop is unremarkable 4, lung volumes indicate some evidence of air trapping with an increased residual volume, the TLC is normal, the increased residual volume confirms the presence of airflow obstruction 5, DLCO is 19.0, 90%, this is normal 6, elderly resistant 3.7 2, 265%, this is increased  Butler Denmark, Laqueta Jean, DNP  [facility] [address], [place], [place] [address] [phone]

## 2020-09-16 NOTE — Patient Instructions (Signed)
Referral to [facility]  If you don't hear back, let me know  Cut back Mestinon, don't take anymore than 4 tablets daily  Call for worsening symptoms  See you back 2 months

## 2020-09-17 ENCOUNTER — Telehealth: Payer: Self-pay | Admitting: Neurology

## 2020-09-17 DIAGNOSIS — G7001 Myasthenia gravis with (acute) exacerbation: Secondary | ICD-10-CM

## 2020-09-17 DIAGNOSIS — I4891 Unspecified atrial fibrillation: Secondary | ICD-10-CM | POA: Diagnosis not present

## 2020-09-17 NOTE — Telephone Encounter (Signed)
Please ask him to come in for blood work, per [facility]fusion needs CBC, CMP, IgG, IgA, IgM before can start IVIG. Orders are in.

## 2020-09-17 NOTE — Telephone Encounter (Signed)
I called the patient, heard back from [facility] team, he is too far out to benefit from treatment. Will get started back on IVIG.  Octagam 10% loading dose 2 g per kg over 4 days, followed by subsequent dose 1 g per kg over 2 days every 4 weeks x 3.

## 2020-09-18 DIAGNOSIS — H02002 Unspecified entropion of right lower eyelid: Secondary | ICD-10-CM | POA: Diagnosis not present

## 2020-09-18 NOTE — Telephone Encounter (Signed)
Called and LMVM for pt to come in for labwork M-TH 8-12, 1315 -1700. Need this prior to starting IVIG.  No appt needed, just walk in at times for [facility] and orders in place.  Once done and results will get you set up for IVIG in fusion.  Please call back if questions.

## 2020-09-22 ENCOUNTER — Other Ambulatory Visit (INDEPENDENT_AMBULATORY_CARE_PROVIDER_SITE_OTHER): Payer: Self-pay

## 2020-09-22 DIAGNOSIS — G7001 Myasthenia gravis with (acute) exacerbation: Secondary | ICD-10-CM | POA: Diagnosis not present

## 2020-09-22 DIAGNOSIS — Z0289 Encounter for other administrative examinations: Secondary | ICD-10-CM

## 2020-09-22 NOTE — Telephone Encounter (Signed)
I called pt and spoke to wife to come in for lab work needed prior to starting IVIG.  She will try to get him him there today.  (by 5p).  She said she will try.

## 2020-09-23 ENCOUNTER — Telehealth: Payer: Self-pay | Admitting: Neurology

## 2020-09-23 LAB — CBC WITH DIFFERENTIAL/PLATELET
Basophils Absolute: 0.1 10*3/uL (ref 0.0–0.2)
Basos: 1 %
EOS (ABSOLUTE): 0.1 10*3/uL (ref 0.0–0.4)
Eos: 2 %
Hematocrit: 38 % (ref 37.5–51.0)
Hemoglobin: 13.1 g/dL (ref 13.0–17.7)
Immature Grans (Abs): 0.1 10*3/uL (ref 0.0–0.1)
Immature Granulocytes: 1 %
Lymphocytes Absolute: 1.4 10*3/uL (ref 0.7–3.1)
Lymphs: 23 %
MCH: 31.1 pg (ref 26.6–33.0)
MCHC: 34.5 g/dL (ref 31.5–35.7)
MCV: 90 fL (ref 79–97)
Monocytes Absolute: 0.4 10*3/uL (ref 0.1–0.9)
Monocytes: 7 %
Neutrophils Absolute: 4 10*3/uL (ref 1.4–7.0)
Neutrophils: 66 %
Platelets: 256 10*3/uL (ref 150–450)
RBC: 4.21 x10E6/uL (ref 4.14–5.80)
RDW: 12.4 % (ref 11.6–15.4)
WBC: 6 10*3/uL (ref 3.4–10.8)

## 2020-09-23 LAB — COMPREHENSIVE METABOLIC PANEL
ALT: 18 IU/L (ref 0–44)
AST: 22 IU/L (ref 0–40)
Albumin/Globulin Ratio: 1.4 (ref 1.2–2.2)
Albumin: 3.7 g/dL (ref 3.6–4.6)
Alkaline Phosphatase: 69 IU/L (ref 44–121)
BUN/Creatinine Ratio: 8 — ABNORMAL LOW (ref 10–24)
BUN: 14 mg/dL (ref 8–27)
Bilirubin Total: 0.4 mg/dL (ref 0.0–1.2)
CO2: 27 mmol/L (ref 20–29)
Calcium: 8.8 mg/dL (ref 8.6–10.2)
Chloride: 105 mmol/L (ref 96–106)
Creatinine, Ser: 1.67 mg/dL — ABNORMAL HIGH (ref 0.76–1.27)
Globulin, Total: 2.7 g/dL (ref 1.5–4.5)
Glucose: 191 mg/dL — ABNORMAL HIGH (ref 65–99)
Potassium: 4.7 mmol/L (ref 3.5–5.2)
Sodium: 144 mmol/L (ref 134–144)
Total Protein: 6.4 g/dL (ref 6.0–8.5)
eGFR: 40 mL/min/{1.73_m2} — ABNORMAL LOW (ref >59)

## 2020-09-23 LAB — IGG, IGA, IGM
IgA/Immunoglobulin A, Serum: 192 mg/dL (ref 61–437)
IgG (Immunoglobin G), Serum: 1135 mg/dL (ref 603–1613)
IgM (Immunoglobulin M), Srm: 34 mg/dL (ref 15–143)

## 2020-09-23 NOTE — Telephone Encounter (Signed)
Called the patient to review lab results.  There was no answer.  Left a message advising the patient to call back.  *If patient returns call please advise back lab results were reviewed by Judson Roch, NP.  Overall she did not have any concerns, but wanted to make him aware his creatinine level is stable but still slightly elevated.  This is unchanged from previous labs that have been drawn.  Judson Roch will send a copy of these labs to his primary care physician.  If the patient has any concerns he can let me know.

## 2020-09-23 NOTE — Telephone Encounter (Signed)
-----   Message from Suzzanne Cloud, NP sent at 09/23/2020  7:58 AM EDT ----- Please print out labs and give to intrafusion, immunoglobulin series needed to start IVIG for MG.  CMP shows elevated random glucose 191, stable elevated creatinine 1.67. also send to PCP to have on hand, our last comparison was 2 years ago.

## 2020-09-24 DIAGNOSIS — Z13220 Encounter for screening for lipoid disorders: Secondary | ICD-10-CM | POA: Diagnosis not present

## 2020-09-24 DIAGNOSIS — E7849 Other hyperlipidemia: Secondary | ICD-10-CM | POA: Diagnosis not present

## 2020-09-24 DIAGNOSIS — I4891 Unspecified atrial fibrillation: Secondary | ICD-10-CM | POA: Diagnosis not present

## 2020-09-24 DIAGNOSIS — I10 Essential (primary) hypertension: Secondary | ICD-10-CM | POA: Diagnosis not present

## 2020-09-24 DIAGNOSIS — Z6831 Body mass index (BMI) 31.0-31.9, adult: Secondary | ICD-10-CM | POA: Diagnosis not present

## 2020-09-24 DIAGNOSIS — Z86711 Personal history of pulmonary embolism: Secondary | ICD-10-CM | POA: Diagnosis not present

## 2020-09-24 DIAGNOSIS — R55 Syncope and collapse: Secondary | ICD-10-CM | POA: Diagnosis not present

## 2020-09-24 DIAGNOSIS — G7000 Myasthenia gravis without (acute) exacerbation: Secondary | ICD-10-CM | POA: Diagnosis not present

## 2020-09-24 DIAGNOSIS — R7303 Prediabetes: Secondary | ICD-10-CM | POA: Diagnosis not present

## 2020-09-24 DIAGNOSIS — Z131 Encounter for screening for diabetes mellitus: Secondary | ICD-10-CM | POA: Diagnosis not present

## 2020-09-24 DIAGNOSIS — M1732 Unilateral post-traumatic osteoarthritis, left knee: Secondary | ICD-10-CM | POA: Diagnosis not present

## 2020-09-24 DIAGNOSIS — N189 Chronic kidney disease, unspecified: Secondary | ICD-10-CM | POA: Diagnosis not present

## 2020-09-24 DIAGNOSIS — E559 Vitamin D deficiency, unspecified: Secondary | ICD-10-CM | POA: Diagnosis not present

## 2020-09-24 DIAGNOSIS — Z1321 Encounter for screening for nutritional disorder: Secondary | ICD-10-CM | POA: Diagnosis not present

## 2020-09-24 NOTE — Telephone Encounter (Signed)
Wife called and the message from [facility] was relayed. No call back requested

## 2020-09-30 DIAGNOSIS — I4891 Unspecified atrial fibrillation: Secondary | ICD-10-CM | POA: Diagnosis not present

## 2020-10-01 DIAGNOSIS — G7001 Myasthenia gravis with (acute) exacerbation: Secondary | ICD-10-CM | POA: Diagnosis not present

## 2020-10-02 DIAGNOSIS — G7001 Myasthenia gravis with (acute) exacerbation: Secondary | ICD-10-CM | POA: Diagnosis not present

## 2020-10-07 DIAGNOSIS — G7001 Myasthenia gravis with (acute) exacerbation: Secondary | ICD-10-CM | POA: Diagnosis not present

## 2020-10-08 DIAGNOSIS — G7001 Myasthenia gravis with (acute) exacerbation: Secondary | ICD-10-CM | POA: Diagnosis not present

## 2020-10-09 DIAGNOSIS — N189 Chronic kidney disease, unspecified: Secondary | ICD-10-CM | POA: Diagnosis not present

## 2020-10-09 DIAGNOSIS — Z86711 Personal history of pulmonary embolism: Secondary | ICD-10-CM | POA: Diagnosis not present

## 2020-10-09 DIAGNOSIS — E559 Vitamin D deficiency, unspecified: Secondary | ICD-10-CM | POA: Diagnosis not present

## 2020-10-09 DIAGNOSIS — I10 Essential (primary) hypertension: Secondary | ICD-10-CM | POA: Diagnosis not present

## 2020-10-09 DIAGNOSIS — Z0001 Encounter for general adult medical examination with abnormal findings: Secondary | ICD-10-CM | POA: Diagnosis not present

## 2020-10-09 DIAGNOSIS — G7000 Myasthenia gravis without (acute) exacerbation: Secondary | ICD-10-CM | POA: Diagnosis not present

## 2020-10-09 DIAGNOSIS — R7303 Prediabetes: Secondary | ICD-10-CM | POA: Diagnosis not present

## 2020-10-09 DIAGNOSIS — Z6830 Body mass index (BMI) 30.0-30.9, adult: Secondary | ICD-10-CM | POA: Diagnosis not present

## 2020-10-28 DIAGNOSIS — I4891 Unspecified atrial fibrillation: Secondary | ICD-10-CM | POA: Diagnosis not present

## 2020-10-29 DIAGNOSIS — G7001 Myasthenia gravis with (acute) exacerbation: Secondary | ICD-10-CM | POA: Diagnosis not present

## 2020-10-30 DIAGNOSIS — G7001 Myasthenia gravis with (acute) exacerbation: Secondary | ICD-10-CM | POA: Diagnosis not present

## 2020-11-04 DIAGNOSIS — I4891 Unspecified atrial fibrillation: Secondary | ICD-10-CM | POA: Diagnosis not present

## 2020-11-10 DIAGNOSIS — I4891 Unspecified atrial fibrillation: Secondary | ICD-10-CM | POA: Diagnosis not present

## 2020-11-18 DIAGNOSIS — G7001 Myasthenia gravis with (acute) exacerbation: Secondary | ICD-10-CM | POA: Diagnosis not present

## 2020-11-19 DIAGNOSIS — G7001 Myasthenia gravis with (acute) exacerbation: Secondary | ICD-10-CM | POA: Diagnosis not present

## 2020-11-20 DIAGNOSIS — I4891 Unspecified atrial fibrillation: Secondary | ICD-10-CM | POA: Diagnosis not present

## 2020-11-20 DIAGNOSIS — Z86711 Personal history of pulmonary embolism: Secondary | ICD-10-CM | POA: Diagnosis not present

## 2020-11-24 DIAGNOSIS — I4891 Unspecified atrial fibrillation: Secondary | ICD-10-CM | POA: Diagnosis not present

## 2020-11-27 ENCOUNTER — Ambulatory Visit: Payer: Medicare Other | Admitting: Neurology

## 2020-11-27 DIAGNOSIS — L820 Inflamed seborrheic keratosis: Secondary | ICD-10-CM | POA: Diagnosis not present

## 2020-11-27 DIAGNOSIS — D485 Neoplasm of uncertain behavior of skin: Secondary | ICD-10-CM | POA: Diagnosis not present

## 2020-11-27 DIAGNOSIS — D235 Other benign neoplasm of skin of trunk: Secondary | ICD-10-CM | POA: Diagnosis not present

## 2020-11-27 DIAGNOSIS — L449 Papulosquamous disorder, unspecified: Secondary | ICD-10-CM | POA: Diagnosis not present

## 2020-11-27 DIAGNOSIS — L309 Dermatitis, unspecified: Secondary | ICD-10-CM | POA: Diagnosis not present

## 2020-12-01 DIAGNOSIS — I4891 Unspecified atrial fibrillation: Secondary | ICD-10-CM | POA: Diagnosis not present

## 2020-12-01 DIAGNOSIS — Z86711 Personal history of pulmonary embolism: Secondary | ICD-10-CM | POA: Diagnosis not present

## 2020-12-02 ENCOUNTER — Encounter: Payer: Self-pay | Admitting: Neurology

## 2020-12-02 ENCOUNTER — Ambulatory Visit (INDEPENDENT_AMBULATORY_CARE_PROVIDER_SITE_OTHER): Payer: Medicare Other | Admitting: Neurology

## 2020-12-02 ENCOUNTER — Other Ambulatory Visit: Payer: Self-pay

## 2020-12-02 ENCOUNTER — Ambulatory Visit: Payer: Medicare Other | Admitting: Neurology

## 2020-12-02 VITALS — BP 149/60 | HR 59 | Ht 69.0 in | Wt 209.5 lb

## 2020-12-02 DIAGNOSIS — G7001 Myasthenia gravis with (acute) exacerbation: Secondary | ICD-10-CM | POA: Diagnosis not present

## 2020-12-02 DIAGNOSIS — R0602 Shortness of breath: Secondary | ICD-10-CM | POA: Diagnosis not present

## 2020-12-02 NOTE — Progress Notes (Signed)
ASSESSMENT AND PLAN 87 y.o. year old male    Seropositive myasthenia gravis with exacerbation, recent positive COVID diagnosis 08/28/2020 Following his COVID diagnosis in April 2022,, increased difficulty with swallowing, general weakness Continued complaints of weakness following 6 days of Medrol pack, higher dose of Mestinon, restarted on IVIG treatment, loading dose on May 18, followed by 1 g/kg maintenance dose June 15, November 18, 2020, Now he is at his baseline, with mild to moderate eye closure, cheek puff weakness, no significant neck, or limb muscle weakness, Will hold off further IVIG treatment at this point, call clinic if he has worsening symptoms, can always restart,  Not a good candidate for long-term immunosuppressive treatment, due to previous history of extensive skin cancer, could not tolerate CellCept, Imuran  Shortness of breath  Baseline intermittent shortness of breath with exertion, this has been a problem since his PE in 2003     DIAGNOSTIC DATA (LABS, IMAGING, TESTING) - I reviewed patient records, labs, notes, testing and imaging myself     HISTORY OF PRESENT ILLNESS:  HORALD BIRKY  is a 63 -year-old right-handed white married male with generalized seropositive myasthenia gravis characterized by bulbar weakness, shortness of breath , and dysphagia. Patient of [name]   He was diagnosed in 10/2002 with a  positive Tensilon test, positive acetylchoine receptor antibody,and had a positive ANA at 1-160 speckled pattern. Initially he had right eye ptosis and subsequently, double vision, fatigue with chewing, dysphagia, and upper and lower extremity weakness. He was initially treated with Mestinon bromide in June 2004,  which was associated with GI side effects.   Prednisone was started in 03/29/2003 and CellCept 05/2003. He developed increased jitteriness, rash, and cramps on CellCept which was discontinued 10/2003. He had progressive bulbar weakness and was admitted  12/2003 for IV IgG therapy which required a PEG tube for dysphagia transiently. He was started on Imuran. 01/17/2004 he was readmitted for 2 days of IV IgG because of shortness of breath. He has not been hospitalized since 01/2004.    He has been slowly tapered off prednisone 09/2007 and was on Imuran and Mestinon bromide.    He had B12 deficiency and was receiving B12 shots ,but  B12 levels off injections have been normal and B12 shots were discontinued.   He has  a history of vague visual loss on the left of unknown etiology. He denies swallowing problems, speech problems, voice changes, focal weakness, or double vision.   He is independent in activities of daily living and takes one nap per day.He denies memory loss but his wife states that he does have memory loss. His last blood studies in my office 05/27/10 were normal except a low white blood cell count of 3900.   His dermatologist [name] was concerned that the Imuran is associated with increased risk of skin cancer and the imuran was discontinued in 12/30/2010. His skin cancer (basal cell cancer) has improved since his imuran was stopped.   He has noted no change In myasthenic symptoms. His skin has improved. His medication for myasthenia is mesinon bromide 60  milligrams one and one half tablets 4 times a day. He has shortness of breath since his pulmonary emboli in 2003, also complains of excessive fatigue.    He is allergic to aspirin, GI side effect, gastric ulcer,  and is on Plavix for a history of TIAs with right arm weakness He has  swallowing problems at the beginning of the meal with  liquids that improves as the meal continues.     He had right CTS release in May 2014 by [name].   He complains of shortness of breath with walking, bending over to tire his shoed, due to PE 2003, pulmnolgist folllowed up, he does not like to chew on tough beef, he denies swallowing difficulty, no double vision, no droopy eyelid, no  significant bleeding muscle weakness. He is active in yard, shop, threadmill     UPDATE Jan 16th 2015: He is now tapering off Mestinon, there was no significant change in his functional status, he denies double vision, no ptosis, continue complains of shortness of breath with exertion no significant and muscle weakness, he has to wash down his food sometimes, but no significant swelling, or chewing difficulty, no dysarthria   UPDATE July 16th 2015: He is no longer mestinon, he does complains of increased breathing difficulty from prolonged walking, no chewing difficulty, no double vision, no ptosis.   Laboratory continue to demonstrate positive acetylcholine binding, and modulating antibodies.  He did reported that his skin cancer has improved after stopping imuran, could not tolerate cellcept per record.  Previously he responded to IVIG very well.   UPDATE Sept 25th 2015: He was stated on prednisone 10mg  qday, and mestinon 60mg  tid in June 2015, which did help his fatigue, shortness of breath, wife reported 50% improvement, he has increased appetite   But he was admitted to [facility] hospital in Sept 17th for bilateral PEs, right leg DVT, he is started on coumadin, bridge with Lovenox, [name] is monitoring his INR   He denies double vision, no ptosis, no limb muscle weakness, no dysphagia   UPDATE April 4th 2016: He is on coumadin for PE, INR was monitored by [name], his myasthenia gravis is under good control, is taking prednisone 10 mg every day, he has surgery in Nov 2015 for right leg cellulitis.   He only does little around his house, he has no double vision, SOB due to previous PE,    UPDATE Feb 18 2015: Previous laboratory evaluation showed mild B12 deficiency with level of 204, He did receive IM B12 supplement at his primary care Dr. [name] office for a while, now with normal repeat laboratory evaluation, injection has stopped He has mild choking difficulty, this has been  going on since August 2016, not getting worse,   he has more trouble with liquid. He complains of shortness of breath when bending over. He denies significant gait difficulty, no breathing difficulty otherwise, he denies double vision, no ptosis,, No chewing difficulties     UPDATE May 22 2015: He came in last visit April 16 2015 complains of worsening blurry vision, double vision, swallowing difficulties, trouble talking, he reported 50% weakness compared to baseline, difficulty getting up from seated position, difficulty chewing, worsening shortness of breath, on examination, he was noted to have increased weakness compared to previous examination, he had moderate eye-closure cheek puff, mild neck flexion weakness, mild bilateral shoulder abduction bilateral hip flexion weakness.   He was treated with IVIG 400 mg/kg every day for 5 days from December 11 to May 02 2015, his vision has much improved, he can walk better, but he continue have swallowing difficulty, dysarthria, he is also taking prednisone 40 mg every day, increase his Mestinon 60 mg to 3-4 tablets daily   He was not able to tolerate immunosuppression treatment in the past due to skin cancer   UPDATE Jul 07 2015:   I  have called his wife, pulmonary function test in June 05 2015.   1, FVC is 3.1 5, 87%, this is normal 2. FEV1 is 2.1 6, 77%, with a percent FEV1 of 68, this indicated mild to moderate primary small  obstruction in this patient was a history of cigarette smoking, large elderly flow rates are generally normal   3. Flow volume loop is unremarkable 4, lung volumes indicate some evidence of air trapping with an increased residual volume, the TLC is normal, the increased residual volume confirms the presence of airflow obstruction   5, DLCO is 19.0, 90%, this is normal 6, elderly resistant 3.7 2, 265%, this is increased   Wife reported, he recently had swallowing study, there was no significant abnormality found,  his swallowing, breathing, symptoms overall has improved   He did think that IVIG treatment in Dec 11-16th 2016 has helped his blurry vision, it is gone, he still feel straggled sometimes, he has shortness with exertion and bending over. His talking is much better.   UPDATE May 22nd 2017: He continue complains of shortness of breath with minimum exertion, no diplopia, no double vision, mild dysphasia,   He had a history of right carpal tunnel release surgery in the past, which has been helpful, now complains of left first 3 finger paresthesia, EMG nerve conduction studies was planned at local hospital   UPDATE July 6th 2017: He stopped taking Mestinon around May 20 third 2017, shortly afterwards, he noticed double vision, he clearly describe eighth of binocular double vision, sometimes vertigo, sometimes horizontal, he went on higher dose of Mestinon 60 mg 4 times a day without improving his symptoms, he slept on a bed with 30 head raise, with no significant breathing difficulty, he could not eat his dinner on July 5th 2017 due to chewing swallowing difficulty, he could not fly flat sleeping, mildly unsteady gait, increased fatigue,   UPDATE August 17th 2017: Last IVIG treatment was December 05 2015, he did very well for a while, was able to cut grass, trim his lawn, it did helped him for at least 2 weeks, then he had passing out    I reviewed laboratory evaluation in August 2017, normal B12, ferritin level, iron binding capacity was within normal limit, low hemoglobin 10 point 8, creatinine was elevated 1.53, glucose was 125   Previous laboratory evaluation in 2016 showed normal folic acid 12 point 5,low normal B12 level 266, hemoglobin in January 2015 was 14 point 3   He will be evaluated by hematologist/oncologist Dr. Mervin Kung in September 6th 2017, at [facility]   He complains of low back pain since he fell you August 1st 2017, has been taking intermittent Tylenol,   He complains of  difficulty swallowing, both liquid, and solid food, no double vision, He is taking mestinon 60mg  4 times a day, which did help him.   Update January 29 2016: I have reviewed his hematologist Dr. Grier Mitts note on September 6th 2017, normocytic microchromic anemia, normal WBC, platelet count, likely from chronic kidney disease, history of fetal deficiency, is on B12 supplement, there was a consideration of low level hemolytic anemia from IVIG infusion, no overt evidence of blood loss   I reviewed laboratory evaluation in September 2017, parietal cell antibody IgG was negative, intrinsic factor negative, mild elevated kappa free light chain, Hg 13.8, creat 1.38, GFR 47, ferritin 163, normal TSh 1.5, elevated vitamin B12 1480   He complains more fatigue, more swallowing difficulty, decreased po intake, he  could not drinking water because worry about the choking episode, increased shortness of breath with exertion, intermittent blurry version, difficulty closing his eyes.   He is now taking mestinon 60mg  qid, no significant side effect, was not sure about the benefit.   Also reviewed swallowing evaluation on June 06 2015 at [facility], there was mild oropharyngeal dysphagia, characterized by spillover to the valleculae and the piriform sinus secondary to reduced oral motor coordination    patient was felt to safe to consume a regular diet with a full range of liquid with  recommendation of upright for food by mouth, and 45 minutes after meals, head flexed slightly forward , swallow at slower rate   UPDATE Oct 16th 2017: I reviewed laboratory in October 2017, immune of fixative electrophoresis showed mild elevated Ig G2.8, otherwise was normal, mild anemia hemoglobin 11.7, mild elevated kappa.free light chain 26, mild elevated creatinine 1.38, normal ferritin 163, normal iron panel, TSH, folic acid, S85 [id number],   Last ivig was in Sept, it has helped him 80%,  he can chew and swallow, he  continue complains of blurry vision,   UPDATE May 05 2016: He is overall much improved, but still complains of SOB, is at his baseline, last infusion was on Nov 27, 28th, he will have more infusion in December, he has mild dysarthria, slight dysphagia will see hematologist Dr. Whitney Muse in January 2018,    UPDATE Oct 04 2018: Patient complains of gradual onset swallowing difficulty since April 2020, to the point he has to modify his daily diet, he can only eat soft food, also increased dyspnea upon exertion, has to cut back his daily activity, he denies double vision, no droopy eyelid, denies difficulty to close his eyes tightly, he denies significant limb muscle weakness,   Update December 13, 2018: He is accompanied by his wife at today's clinic visit, he had received IVIG treatment in June, July, planning on receiving it again on August 3 to 10/2018, he tolerated infusion well, noticed moderate to significant improvement of his muscle weakness, 80% back to his baseline, he can swallowing better, move better, but continue noticed mild bilateral upper extremity weakness, taking Mestinon 4 times a day, prednisone 10 mg daily   UPDATE July 16 2019: He had IVIG treatment in June, July, August, September 2020, after that, he was able to bounce back to baseline, gradually tapered off prednisone treatment,   He is still taking Mestinon 60 mg 4 times a day, complains of shortness of breath occasionally denied double vision, swallowing difficulty  UPDATE Mar 19 2020: Because of the increased swallowing difficulty, double vision, he received IVIG treatment loading dose 2 g/kg in September, maintenance dose in October, he reported significant improvement, now close to baseline, gained 7 pounds over the past few months, has third infusion planned in November  He only take Mestinon as needed, no longer taking prednisone,  UPDATE December 02 2020:  Following his COVID infection on August 28, 2020, presented with cough,  stomachache, increased difficulty breathing, also increased swallowing, dysarthria, despite taking higher dose of Mestinon 1.5 mg 3 times a day, also complains of generalized weakness, decreased mobility  He was noted to have mild neck flexion, proximal upper lower extremity muscle weakness at last visit on Sep 16, 2020 with several, we decided to proceed with IVIG,  Loading dose 2 g/kg on May 18, followed by maintenance dose 1 g/kg on June 15, November 18, 2020  Now he feels much better, back to  his baseline, still has frequent cough, mild shortness of breath, on residual deficit of his PE, no longer has swallowing difficulty, getting stronger, no significant gait abnormality,   PHYSICAL EXAM  Vitals:   12/02/20 1255  BP: (!) 149/60  Pulse: (!) 59  Weight: 209 lb 8 oz (95 kg)  Height: 5\' 9"  (1.753 m)   Body mass index is 30.94 kg/m.  PHYSICAL EXAMNIATION:  Gen: NAD, conversant, well nourised, well groomed                     Cardiovascular: Regular rate rhythm, no peripheral edema, warm, nontender. Eyes: Conjunctivae clear without exudates or hemorrhage Pulmonary: Clear to auscultation bilaterally   NEUROLOGICAL EXAM:  MENTAL STATUS: Speech/Cognition: Awake, alert, mild dysarthria, hoarse voice, oriented to history taking and casual conversation.  CRANIAL NERVES: CN II: Visual fields are full to confrontation.  Pupils are round equal and briskly reactive to light. CN III, IV, VI: extraocular movement are normal. No ptosis. CN V: Facial sensation is intact to light touch. CN VII: Mild to moderate eye closure, cheek puff weakness CN VIII: Hearing is normal to casual conversation CN XI: Head turning and shoulder shrug are intact  MOTOR: No significant neck flexion, bilateral upper extremity proximal and distal muscle weakness  REFLEXES: Hyporreflexia  SENSORY: Intact to light touch  COORDINATION: Finger-nose-finger and heel-to-shin is normal  GAIT/STANCE: Able to get up  from seated position arm crossed, steady,  REVIEW OF SYSTEMS:  Out of a complete 14 system review of symptoms, the patient complains only of the following symptoms, and all other reviewed systems are negative.  See HPI  ALLERGIES: Allergies  Allergen Reactions   Aspirin    Shellfish Allergy    Tramadol Nausea Only    HOME MEDICATIONS: Outpatient Medications Prior to Visit  Medication Sig Dispense Refill   pyridostigmine (MESTINON) 60 MG tablet Take 1 tablet (60 mg total) by mouth in the morning, at noon, in the evening, and at bedtime. 360 tablet 3   valsartan-hydrochlorothiazide (DIOVAN-HCT) 320-25 MG per tablet Take 1 tablet by mouth daily.     vitamin B-12 (CYANOCOBALAMIN) 1000 MCG tablet Take 1,000 mcg by mouth daily.      warfarin (COUMADIN) 5 MG tablet 5 mg. Taking 5mg  Mon, Wed, Fri.  Taking 2.5mg  Sun, Tues, Thurs, Sat.     dexamethasone (DECADRON) 2 MG tablet 3 tablets daily for 2 days, 2 tablets daily for 2 days, 1 tablet daily for 2 days, then stop 12 tablet 0   No facility-administered medications prior to visit.    PAST MEDICAL HISTORY: Past Medical History:  Diagnosis Date   B12 deficiency    Essential tremor    Hypertension    Memory deficit    Myasthenia gravis ([place])    since 2004   Pulmonary emboli ([place])    in Nov 2003   Skin cancer     PAST SURGICAL HISTORY: Past Surgical History:  Procedure Laterality Date   CARPAL TUNNEL RELEASE Right 09/14/2012   Procedure: CARPAL TUNNEL RELEASE;  Surgeon: Cammie Sickle., MD;  Location: [facility];  Service: Orthopedics;  Laterality: Right;   CARPAL TUNNEL RELEASE Left 11/04/2015   Procedure: LEFT CARPAL TUNNEL RELEASE, EXTENDED INCISION;  Surgeon: Daryll Brod, MD;  Location: [facility];  Service: Orthopedics;  Laterality: Left;  ANESTHESIA: IV REGIONAL UPPER ARM   CATARACT EXTRACTION Right    CHOLECYSTECTOMY  2012   [facility]   COLONOSCOPY  FOOT SURGERY  1966   lt orif foot    SKIN CANCER EXCISION      FAMILY HISTORY: Family History  Problem Relation Age of Onset   Heart disease Mother    Heart disease Father     SOCIAL HISTORY: Social History   Socioeconomic History   Marital status: Married    Spouse name: [name]   Number of children: 2   Years of education: 12   Highest education level: Not on file  Occupational History   Occupation: Plant    Comment: Retired  Tobacco Use   Smoking status: Former    Types: Cigarettes    Quit date: 05/17/1968    Years since quitting: 52.5   Smokeless tobacco: Never  Substance and Sexual Activity   Alcohol use: No   Drug use: No   Sexual activity: Not on file  Other Topics Concern   Not on file  Social History Narrative   He lives with his wife [name](, has 2 children, retired.    Hard of hearing.   Right handed.   Caffeine- None   Social Determinants of Health   Financial Resource Strain: Not on file  Food Insecurity: Not on file  Transportation Needs: Not on file  Physical Activity: Not on file  Stress: Not on file  Social Connections: Not on file  Intimate Partner Violence: Not on file

## 2020-12-03 ENCOUNTER — Encounter: Payer: Self-pay | Admitting: Neurology

## 2020-12-10 DIAGNOSIS — I4891 Unspecified atrial fibrillation: Secondary | ICD-10-CM | POA: Diagnosis not present

## 2020-12-11 DIAGNOSIS — D485 Neoplasm of uncertain behavior of skin: Secondary | ICD-10-CM | POA: Diagnosis not present

## 2020-12-11 DIAGNOSIS — L309 Dermatitis, unspecified: Secondary | ICD-10-CM | POA: Diagnosis not present

## 2020-12-15 DIAGNOSIS — I4891 Unspecified atrial fibrillation: Secondary | ICD-10-CM | POA: Diagnosis not present

## 2020-12-19 DIAGNOSIS — I4891 Unspecified atrial fibrillation: Secondary | ICD-10-CM | POA: Diagnosis not present

## 2020-12-25 DIAGNOSIS — M1732 Unilateral post-traumatic osteoarthritis, left knee: Secondary | ICD-10-CM | POA: Diagnosis not present

## 2020-12-29 ENCOUNTER — Encounter: Payer: Self-pay | Admitting: Neurology

## 2020-12-29 ENCOUNTER — Other Ambulatory Visit: Payer: Self-pay

## 2020-12-29 ENCOUNTER — Telehealth: Payer: Self-pay | Admitting: Neurology

## 2020-12-29 ENCOUNTER — Ambulatory Visit (INDEPENDENT_AMBULATORY_CARE_PROVIDER_SITE_OTHER): Payer: Medicare Other | Admitting: Neurology

## 2020-12-29 VITALS — BP 153/74 | HR 52 | Ht 69.0 in | Wt 198.5 lb

## 2020-12-29 DIAGNOSIS — R1310 Dysphagia, unspecified: Secondary | ICD-10-CM

## 2020-12-29 DIAGNOSIS — Z86711 Personal history of pulmonary embolism: Secondary | ICD-10-CM | POA: Diagnosis not present

## 2020-12-29 DIAGNOSIS — G7001 Myasthenia gravis with (acute) exacerbation: Secondary | ICD-10-CM

## 2020-12-29 DIAGNOSIS — R0602 Shortness of breath: Secondary | ICD-10-CM | POA: Diagnosis not present

## 2020-12-29 MED ORDER — PYRIDOSTIGMINE BROMIDE 60 MG PO TABS: 60.0000 mg | ORAL_TABLET | Freq: Three times a day (TID) | ORAL | 11 refills | Status: DC

## 2020-12-29 NOTE — Telephone Encounter (Signed)
Pt's wife [name] called says her husband is declining, she states he can hardly swallow. Pt's wife is requesting a call back.

## 2020-12-29 NOTE — Telephone Encounter (Addendum)
I spoke to his wife. Reports patient has been having more difficulty swallowing for the last two days. He has not been able to take his pills and even skipped breakfast this morning. He is able to breath without any problems but does feel it is slightly more shallow than normal. Overall, feels bad - fatigued. No fever (oral temp 97.5), diarrhea or other signs of obvious infection. His wife would like Dr. [name] to evaluate him today. He has been worked into the schedule.

## 2020-12-29 NOTE — Progress Notes (Signed)
ASSESSMENT AND PLAN 87 y.o. year old male    Seropositive myasthenia gravis with exacerbation, Following his COVID diagnosis in April 2022,, increased difficulty with swallowing, general weakness Continued complaints of weakness following 6 days of Medrol pack, higher dose of Mestinon, restarted on IVIG treatment, loading dose on May 18, followed by 1 g/kg maintenance dose June 15, November 18, 2020, He was doing well in July, stopped the maintenance dose of IVIG, now complains of a week history of worsening swallowing difficulty, mild slurred speech, will restarted IVIG 1 g/kg  Not a good candidate for long-term immunosuppressive treatment, due to previous history of extensive skin cancer, could not tolerate CellCept, Imuran  Shortness of breath  Baseline intermittent shortness of breath with exertion, this has been a problem since his PE in 2003, on chronic Coumadin treatment     DIAGNOSTIC DATA (LABS, IMAGING, TESTING) - I reviewed patient records, labs, notes, testing and imaging myself     HISTORY OF PRESENT ILLNESS:  Calvin Sloan  is a 8 -year-old right-handed white married male with generalized seropositive myasthenia gravis characterized by bulbar weakness, shortness of breath , and dysphagia. Patient of [name]   He was diagnosed in 10/2002 with a  positive Tensilon test, positive acetylchoine receptor antibody,and had a positive ANA at 1-160 speckled pattern. Initially he had right eye ptosis and subsequently, double vision, fatigue with chewing, dysphagia, and upper and lower extremity weakness. He was initially treated with Mestinon bromide in June 2004,  which was associated with GI side effects.   Prednisone was started in 03/29/2003 and CellCept 05/2003. He developed increased jitteriness, rash, and cramps on CellCept which was discontinued 10/2003. He had progressive bulbar weakness and was admitted 12/2003 for IV IgG therapy which required a PEG tube for dysphagia  transiently. He was started on Imuran. 01/17/2004 he was readmitted for 2 days of IV IgG because of shortness of breath. He has not been hospitalized since 01/2004.    He has been slowly tapered off prednisone 09/2007 and was on Imuran and Mestinon bromide.    He had B12 deficiency and was receiving B12 shots ,but  B12 levels off injections have been normal and B12 shots were discontinued.   He has  a history of vague visual loss on the left of unknown etiology. He denies swallowing problems, speech problems, voice changes, focal weakness, or double vision.   He is independent in activities of daily living and takes one nap per day.He denies memory loss but his wife states that he does have memory loss. His last blood studies in my office 05/27/10 were normal except a low white blood cell count of 3900.   His dermatologist [name] was concerned that the Imuran is associated with increased risk of skin cancer and the imuran was discontinued in 12/30/2010. His skin cancer (basal cell cancer) has improved since his imuran was stopped.   He has noted no change In myasthenic symptoms. His skin has improved. His medication for myasthenia is mesinon bromide 60  milligrams one and one half tablets 4 times a day. He has shortness of breath since his pulmonary emboli in 2003, also complains of excessive fatigue.    He is allergic to aspirin, GI side effect, gastric ulcer,  and is on Plavix for a history of TIAs with right arm weakness He has  swallowing problems at the beginning of the meal with liquids that improves as the meal continues.     He  had right CTS release in May 2014 by [name].   He complains of shortness of breath with walking, bending over to tire his shoed, due to PE 2003, pulmnolgist folllowed up, he does not like to chew on tough beef, he denies swallowing difficulty, no double vision, no droopy eyelid, no significant bleeding muscle weakness. He is active in yard, shop, threadmill      UPDATE Jan 16th 2015: He is now tapering off Mestinon, there was no significant change in his functional status, he denies double vision, no ptosis, continue complains of shortness of breath with exertion no significant and muscle weakness, he has to wash down his food sometimes, but no significant swelling, or chewing difficulty, no dysarthria   UPDATE July 16th 2015: He is no longer mestinon, he does complains of increased breathing difficulty from prolonged walking, no chewing difficulty, no double vision, no ptosis.   Laboratory continue to demonstrate positive acetylcholine binding, and modulating antibodies.  He did reported that his skin cancer has improved after stopping imuran, could not tolerate cellcept per record.  Previously he responded to IVIG very well.   UPDATE Sept 25th 2015: He was stated on prednisone '10mg'$  qday, and mestinon '60mg'$  tid in June 2015, which did help his fatigue, shortness of breath, wife reported 50% improvement, he has increased appetite   But he was admitted to [facility] in Sept 17th for bilateral PEs, right leg DVT, he is started on coumadin, bridge with Lovenox, [name] is monitoring his INR   He denies double vision, no ptosis, no limb muscle weakness, no dysphagia   UPDATE April 4th 2016: He is on coumadin for PE, INR was monitored by [name], his myasthenia gravis is under good control, is taking prednisone 10 mg every day, he has surgery in Nov 2015 for right leg cellulitis.   He only does little around his house, he has no double vision, SOB due to previous PE,    UPDATE Feb 18 2015: Previous laboratory evaluation showed mild B12 deficiency with level of 204, He did receive IM B12 supplement at his primary care Dr. [name] office for a while, now with normal repeat laboratory evaluation, injection has stopped He has mild choking difficulty, this has been going on since August 2016, not getting worse,   he has more trouble with  liquid. He complains of shortness of breath when bending over. He denies significant gait difficulty, no breathing difficulty otherwise, he denies double vision, no ptosis,, No chewing difficulties     UPDATE May 22 2015: He came in last visit April 16 2015 complains of worsening blurry vision, double vision, swallowing difficulties, trouble talking, he reported 50% weakness compared to baseline, difficulty getting up from seated position, difficulty chewing, worsening shortness of breath, on examination, he was noted to have increased weakness compared to previous examination, he had moderate eye-closure cheek puff, mild neck flexion weakness, mild bilateral shoulder abduction bilateral hip flexion weakness.   He was treated with IVIG 400 mg/kg every day for 5 days from December 11 to May 02 2015, his vision has much improved, he can walk better, but he continue have swallowing difficulty, dysarthria, he is also taking prednisone 40 mg every day, increase his Mestinon 60 mg to 3-4 tablets daily   He was not able to tolerate immunosuppression treatment in the past due to skin cancer   UPDATE Jul 07 2015:   I have called his wife, pulmonary function test in June 05 2015.  1, FVC is 3.1 5, 87%, this is normal 2. FEV1 is 2.1 6, 77%, with a percent FEV1 of 68, this indicated mild to moderate primary small  obstruction in this patient was a history of cigarette smoking, large elderly flow rates are generally normal   3. Flow volume loop is unremarkable 4, lung volumes indicate some evidence of air trapping with an increased residual volume, the TLC is normal, the increased residual volume confirms the presence of airflow obstruction   5, DLCO is 19.0, 90%, this is normal 6, elderly resistant 3.7 2, 265%, this is increased   Wife reported, he recently had swallowing study, there was no significant abnormality found, his swallowing, breathing, symptoms overall has improved   He did think  that IVIG treatment in Dec 11-16th 2016 has helped his blurry vision, it is gone, he still feel straggled sometimes, he has shortness with exertion and bending over. His talking is much better.   UPDATE May 22nd 2017: He continue complains of shortness of breath with minimum exertion, no diplopia, no double vision, mild dysphasia,   He had a history of right carpal tunnel release surgery in the past, which has been helpful, now complains of left first 3 finger paresthesia, EMG nerve conduction studies was planned at local hospital   UPDATE July 6th 2017: He stopped taking Mestinon around May 20 third 2017, shortly afterwards, he noticed double vision, he clearly describe eighth of binocular double vision, sometimes vertigo, sometimes horizontal, he went on higher dose of Mestinon 60 mg 4 times a day without improving his symptoms, he slept on a bed with 30 head raise, with no significant breathing difficulty, he could not eat his dinner on July 5th 2017 due to chewing swallowing difficulty, he could not fly flat sleeping, mildly unsteady gait, increased fatigue,   UPDATE August 17th 2017: Last IVIG treatment was December 05 2015, he did very well for a while, was able to cut grass, trim his lawn, it did helped him for at least 2 weeks, then he had passing out    I reviewed laboratory evaluation in August 2017, normal B12, ferritin level, iron binding capacity was within normal limit, low hemoglobin 10 point 8, creatinine was elevated 1.53, glucose was 125   Previous laboratory evaluation in 2016 showed normal folic acid 12 point 5,low normal B12 level 266, hemoglobin in January 2015 was 14 point 3   He will be evaluated by hematologist/oncologist Dr. Mervin Kung in September 6th 2017, at [facility]   He complains of low back pain since he fell you August 1st 2017, has been taking intermittent Tylenol,   He complains of difficulty swallowing, both liquid, and solid food, no double vision, He is  taking mestinon '60mg'$  4 times a day, which did help him.   Update January 29 2016: I have reviewed his hematologist Dr. Grier Mitts note on September 6th 2017, normocytic microchromic anemia, normal WBC, platelet count, likely from chronic kidney disease, history of fetal deficiency, is on B12 supplement, there was a consideration of low level hemolytic anemia from IVIG infusion, no overt evidence of blood loss   I reviewed laboratory evaluation in September 2017, parietal cell antibody IgG was negative, intrinsic factor negative, mild elevated kappa free light chain, Hg 13.8, creat 1.38, GFR 47, ferritin 163, normal TSh 1.5, elevated vitamin B12 1480   He complains more fatigue, more swallowing difficulty, decreased po intake, he could not drinking water because worry about the choking episode, increased shortness of  breath with exertion, intermittent blurry version, difficulty closing his eyes.   He is now taking mestinon '60mg'$  qid, no significant side effect, was not sure about the benefit.   Also reviewed swallowing evaluation on June 06 2015 at [facility], there was mild oropharyngeal dysphagia, characterized by spillover to the valleculae and the piriform sinus secondary to reduced oral motor coordination    patient was felt to safe to consume a regular diet with a full range of liquid with  recommendation of upright for food by mouth, and 45 minutes after meals, head flexed slightly forward , swallow at slower rate   UPDATE Oct 16th 2017: I reviewed laboratory in October 2017, immune of fixative electrophoresis showed mild elevated Ig G2.8, otherwise was normal, mild anemia hemoglobin 11.7, mild elevated kappa.free light chain 26, mild elevated creatinine 1.38, normal ferritin 163, normal iron panel, TSH, folic acid, [id number],   Last ivig was in Sept, it has helped him 80%,  he can chew and swallow, he continue complains of blurry vision,   UPDATE May 05 2016: He is overall  much improved, but still complains of SOB, is at his baseline, last infusion was on Nov 27, 28th, he will have more infusion in December, he has mild dysarthria, slight dysphagia will see hematologist Dr. Whitney Muse in January 2018,    UPDATE Oct 04 2018: Patient complains of gradual onset swallowing difficulty since April 2020, to the point he has to modify his daily diet, he can only eat soft food, also increased dyspnea upon exertion, has to cut back his daily activity, he denies double vision, no droopy eyelid, denies difficulty to close his eyes tightly, he denies significant limb muscle weakness,   Update December 13, 2018: He is accompanied by his wife at today's clinic visit, he had received IVIG treatment in June, July, planning on receiving it again on August 3 to 10/2018, he tolerated infusion well, noticed moderate to significant improvement of his muscle weakness, 80% back to his baseline, he can swallowing better, move better, but continue noticed mild bilateral upper extremity weakness, taking Mestinon 4 times a day, prednisone 10 mg daily   UPDATE July 16 2019: He had IVIG treatment in June, July, August, September 2020, after that, he was able to bounce back to baseline, gradually tapered off prednisone treatment,   He is still taking Mestinon 60 mg 4 times a day, complains of shortness of breath occasionally denied double vision, swallowing difficulty  UPDATE Mar 19 2020: Because of the increased swallowing difficulty, double vision, he received IVIG treatment loading dose 2 g/kg in September, maintenance dose in October, he reported significant improvement, now close to baseline, gained 7 pounds over the past few months, has third infusion planned in November  He only take Mestinon as needed, no longer taking prednisone,  UPDATE December 02 2020:  Following his COVID infection on August 28, 2020, presented with cough, stomachache, increased difficulty breathing, also increased swallowing,  dysarthria, despite taking higher dose of Mestinon 1.5 mg 3 times a day, also complains of generalized weakness, decreased mobility  He was noted to have mild neck flexion, proximal upper lower extremity muscle weakness at last visit on Sep 16, 2020 with several, we decided to proceed with IVIG,  Loading dose 2 g/kg on May 18, followed by maintenance dose 1 g/kg on June 15, November 18, 2020  Now he feels much better, back to his baseline, still has frequent cough, mild shortness of breath, on residual deficit  of his PE, no longer has swallowing difficulty, getting stronger, no significant gait abnormality,   UPDATE December 29 2020: He was doing so well last visit on December 02, 2020, we decided to hold off IVIG maintenance dose 1 g/kg, he reported a week history of gradually worsening swallowing chewing difficulty, despite taking Mestinon up to 60 mg 3 times a day, he described difficulty to swallow down food, has to wash down with drinks, induced cough sometimes also noticed mild increased slurred voice,  He had a history of PE, taking Coumadin, shortness of breath at baseline, no significant change, no significant gait abnormality, he denies double vision  PHYSICAL EXAM  Vitals:   12/02/20 1255  BP: (!) 149/60  Pulse: (!) 59  Weight: 209 lb 8 oz (95 kg)  Height: '5\' 9"'$  (1.753 m)   Body mass index is 30.94 kg/m.  PHYSICAL EXAMNIATION:  Gen: NAD, conversant, well nourised, well groomed                     Cardiovascular: Regular rate rhythm, no peripheral edema, warm, nontender. Eyes: Conjunctivae clear without exudates or hemorrhage Pulmonary: Clear to auscultation bilaterally   NEUROLOGICAL EXAM:  MENTAL STATUS: Speech/Cognition: Awake, alert, mild dysarthria, hoarse voice, oriented to history taking and casual conversation.  CRANIAL NERVES: CN II: Visual fields are full to confrontation.  Pupils are round equal and briskly reactive to light. CN III, IV, VI: extraocular movement are  normal. No ptosis. CN V: Facial sensation is intact to light touch. CN VII: moderate eye closure, cheek puff weakness CN VIII: Hearing is normal to casual conversation CN XI: Head turning and shoulder shrug are intact  MOTOR: He has mild neck flexion weakness, mild bilateral upper extremity proximal muscle weakness,  REFLEXES: Hyporreflexia  SENSORY: Intact to light touch  COORDINATION: Finger-nose-finger and heel-to-shin is normal  GAIT/STANCE: Able to get up from seated position arm crossed, steady,  REVIEW OF SYSTEMS:  Out of a complete 14 system review of symptoms, the patient complains only of the following symptoms, and all other reviewed systems are negative.  See HPI  ALLERGIES: Allergies  Allergen Reactions   Aspirin    Shellfish Allergy    Tramadol Nausea Only    HOME MEDICATIONS: Outpatient Medications Prior to Visit  Medication Sig Dispense Refill   dexamethasone (DECADRON) 2 MG tablet 3 tablets daily for 2 days, 2 tablets daily for 2 days, 1 tablet daily for 2 days, then stop 12 tablet 0   pyridostigmine (MESTINON) 60 MG tablet Take 1 tablet (60 mg total) by mouth in the morning, at noon, in the evening, and at bedtime. 360 tablet 3   valsartan-hydrochlorothiazide (DIOVAN-HCT) 320-25 MG per tablet Take 0.5 tablets by mouth daily.     vitamin B-12 (CYANOCOBALAMIN) 1000 MCG tablet Take 1,000 mcg by mouth daily.      warfarin (COUMADIN) 5 MG tablet 5 mg. Taking 7.'5mg'$  Mon, Wed, Fri.  Taking '5mg'$  Sun, Tues, Thurs, Sat.     No facility-administered medications prior to visit.    PAST MEDICAL HISTORY: Past Medical History:  Diagnosis Date   B12 deficiency    Essential tremor    Hypertension    Memory deficit    Myasthenia gravis ([place])    since 2004   Pulmonary emboli ([place])    in Nov 2003   Skin cancer     PAST SURGICAL HISTORY: Past Surgical History:  Procedure Laterality Date   CARPAL TUNNEL RELEASE Right 09/14/2012  Procedure: CARPAL TUNNEL RELEASE;   Surgeon: Cammie Sickle., MD;  Location: [place];  Service: Orthopedics;  Laterality: Right;   CARPAL TUNNEL RELEASE Left 11/04/2015   Procedure: LEFT CARPAL TUNNEL RELEASE, EXTENDED INCISION;  Surgeon: Daryll Brod, MD;  Location: [place];  Service: Orthopedics;  Laterality: Left;  ANESTHESIA: IV REGIONAL UPPER ARM   CATARACT EXTRACTION Right    CHOLECYSTECTOMY  2012   [place]   COLONOSCOPY     FOOT SURGERY  1966   lt orif foot   SKIN CANCER EXCISION      FAMILY HISTORY: Family History  Problem Relation Age of Onset   Heart disease Mother    Heart disease Father     SOCIAL HISTORY: Social History   Socioeconomic History   Marital status: Married    Spouse name: [name]   Number of children: 2   Years of education: 12   Highest education level: Not on file  Occupational History   Occupation: Plant    Comment: Retired  Tobacco Use   Smoking status: Former    Types: Cigarettes    Quit date: 05/17/1968    Years since quitting: 52.6   Smokeless tobacco: Never  Substance and Sexual Activity   Alcohol use: No   Drug use: No   Sexual activity: Not on file  Other Topics Concern   Not on file  Social History Narrative   He lives with his wife [name](, has 2 children, retired.    Hard of hearing.   Right handed.   Caffeine- None   Social Determinants of Health   Financial Resource Strain: Not on file  Food Insecurity: Not on file  Transportation Needs: Not on file  Physical Activity: Not on file  Stress: Not on file  Social Connections: Not on file  Intimate Partner Violence: Not on file

## 2021-01-05 DIAGNOSIS — G7001 Myasthenia gravis with (acute) exacerbation: Secondary | ICD-10-CM | POA: Diagnosis not present

## 2021-01-06 DIAGNOSIS — G7001 Myasthenia gravis with (acute) exacerbation: Secondary | ICD-10-CM | POA: Diagnosis not present

## 2021-01-12 DIAGNOSIS — H01002 Unspecified blepharitis right lower eyelid: Secondary | ICD-10-CM | POA: Diagnosis not present

## 2021-01-12 DIAGNOSIS — H01001 Unspecified blepharitis right upper eyelid: Secondary | ICD-10-CM | POA: Diagnosis not present

## 2021-01-12 DIAGNOSIS — H02132 Senile ectropion of right lower eyelid: Secondary | ICD-10-CM | POA: Diagnosis not present

## 2021-01-12 DIAGNOSIS — H25812 Combined forms of age-related cataract, left eye: Secondary | ICD-10-CM | POA: Diagnosis not present

## 2021-01-20 DIAGNOSIS — I4891 Unspecified atrial fibrillation: Secondary | ICD-10-CM | POA: Diagnosis not present

## 2021-01-27 DIAGNOSIS — G7001 Myasthenia gravis with (acute) exacerbation: Secondary | ICD-10-CM | POA: Diagnosis not present

## 2021-01-28 DIAGNOSIS — G7001 Myasthenia gravis with (acute) exacerbation: Secondary | ICD-10-CM | POA: Diagnosis not present

## 2021-01-30 DIAGNOSIS — I2699 Other pulmonary embolism without acute cor pulmonale: Secondary | ICD-10-CM | POA: Diagnosis not present

## 2021-01-30 DIAGNOSIS — I4891 Unspecified atrial fibrillation: Secondary | ICD-10-CM | POA: Diagnosis not present

## 2021-02-05 DIAGNOSIS — H16211 Exposure keratoconjunctivitis, right eye: Secondary | ICD-10-CM | POA: Diagnosis not present

## 2021-02-05 DIAGNOSIS — H02231 Paralytic lagophthalmos right upper eyelid: Secondary | ICD-10-CM | POA: Diagnosis not present

## 2021-02-05 DIAGNOSIS — H02135 Senile ectropion of left lower eyelid: Secondary | ICD-10-CM | POA: Diagnosis not present

## 2021-02-05 DIAGNOSIS — H16213 Exposure keratoconjunctivitis, bilateral: Secondary | ICD-10-CM | POA: Diagnosis not present

## 2021-02-05 DIAGNOSIS — H04123 Dry eye syndrome of bilateral lacrimal glands: Secondary | ICD-10-CM | POA: Diagnosis not present

## 2021-02-05 DIAGNOSIS — H02234 Paralytic lagophthalmos left upper eyelid: Secondary | ICD-10-CM | POA: Diagnosis not present

## 2021-02-05 DIAGNOSIS — H02132 Senile ectropion of right lower eyelid: Secondary | ICD-10-CM | POA: Diagnosis not present

## 2021-02-05 DIAGNOSIS — G7000 Myasthenia gravis without (acute) exacerbation: Secondary | ICD-10-CM | POA: Diagnosis not present

## 2021-02-05 DIAGNOSIS — H16212 Exposure keratoconjunctivitis, left eye: Secondary | ICD-10-CM | POA: Diagnosis not present

## 2021-02-05 DIAGNOSIS — H02112 Cicatricial ectropion of right lower eyelid: Secondary | ICD-10-CM | POA: Diagnosis not present

## 2021-02-06 DIAGNOSIS — I4891 Unspecified atrial fibrillation: Secondary | ICD-10-CM | POA: Diagnosis not present

## 2021-02-16 ENCOUNTER — Encounter (HOSPITAL_COMMUNITY): Payer: Self-pay

## 2021-02-16 ENCOUNTER — Other Ambulatory Visit: Payer: Self-pay

## 2021-02-16 ENCOUNTER — Encounter (HOSPITAL_COMMUNITY)
Admission: RE | Admit: 2021-02-16 | Discharge: 2021-02-16 | Disposition: A | Discharge status: Home / Self Care | Payer: Medicare Other | Source: Ambulatory Visit | Attending: Ophthalmology | Admitting: Ophthalmology

## 2021-02-16 HISTORY — DX: Chronic obstructive pulmonary disease, unspecified: J44.9

## 2021-02-16 HISTORY — DX: Other specified postprocedural states: Z98.890

## 2021-02-16 HISTORY — DX: Other specified postprocedural states: R11.2

## 2021-02-16 NOTE — Pre-Procedure Instructions (Signed)
Left voice mail for patient to call back for pre-op phone call. °

## 2021-02-17 DIAGNOSIS — G7001 Myasthenia gravis with (acute) exacerbation: Secondary | ICD-10-CM | POA: Diagnosis not present

## 2021-02-17 NOTE — H&P (Addendum)
Surgical History & Physical  Patient Name: Calvin Sloan DOB: [birth year]  Surgery: Cataract extraction with intraocular lens implant phacoemulsification; Left Eye  Surgeon: Baruch Goldmann MD Surgery Date:  03-12-2021 Pre-Op Date:  02-26-2021  HPI: A 75 Yr. old male patient Pt referred by Dr.Cottor for RLL ectropion and cataract evaluation OS. For the cataract in the left eye - The patient complains of difficulty when seeing street signs, which began 3 years ago. The left eye is affected. The episode is gradual. The condition's severity is worsening. The complaint is associated with blurry vision, glare and halos. Symptoms are negatively affecting pt's quality of life. Pt states ectropion RLL is constantly irritated and worsens with air conditioned areas. Pt states eye is constantly red, fb sensation and tearing. Pt has noticed symptoms worsening over the past year. Pt stopped use of eye drops RLL in June due to running out of drops (loteprednol qhs OD). Pt did not contact OD for refill. Pt denies any eye pain or increase in floaters/flashes of light.  Medical History: Dry Eyes Cataracts Ectropion RLL (severe) Macula Degeneration Glaucoma Acid Reflux Myasthenia gravis blood clots Arthritis Cancer High Blood Pressure Thyroid Problems  Review of Systems Negative Allergic/Immunologic Negative Cardiovascular Negative Constitutional Negative Ear, Nose, Mouth & Throat Negative Endocrine Negative Eyes Negative Gastrointestinal Negative Genitourinary Negative Hemotologic/Lymphatic Negative Integumentary Negative Musculoskeletal Negative Neurological Negative Psychiatry Negative Respiratory  Social   Former smoker   Medication Diovan, Mestinon, Coumadin,   Sx/Procedures Phaco c IOL, Excision of melanoma, Gallbladder Sx,   Drug Allergies  Aspirin, Shellfish,   History & Physical: Heent: Cataract, Let Eye NECK: supple without bruits LUNGS: lungs clear to auscultation CV:  regular rate and rhythm Abdomen: soft and non-tender  Impression & Plan: Assessment: 1.  COMBINED FORMS AGE RELATED CATARACT; Left Eye (H25.812) 2.  INTRAOCULAR LENS IOL ; Right Eye (Z96.1) 3.  ECTROPION SENILE; Right Lower Lid (H02.132) 4.  BLEPHARITIS; Right Upper Lid, Right Lower Lid, Left Upper Lid, Left Lower Lid (H01.001, H01.002,H01.004,H01.005) 5.  Pinguecula; Both Eyes (H11.153) 6.  DERMATOCHALASIS; Right Upper Lid, Left Upper Lid (H02.831, T11.735)  Plan: 1.  Cataract accounts for the patient's decreased vision. This visual impairment is not correctable with a tolerable change in glasses or contact lenses. Cataract surgery with an implantation of a new lens should significantly improve the visual and functional status of the patient. Discussed all risks, benefits, alternatives, and potential complications. Discussed the procedures and recovery. Patient desires to have surgery. A-scan ordered and performed today for intra-ocular lens calculations. The surgery will be performed in order to improve vision for driving, reading, and for eye examinations. Recommend phacoemulsification with intra-ocular lens. Recommend Dextenza for post-operative pain and inflammation. Left Eye. Surgery required to correct imbalance of vision. Dilates poorly - shugacaine by protocol. Omidira. Dense cataract. Had problems with nausea after OD - will request Zofran or other med pre-operatively.  2.  Stable. Doing well since surgery Request records from a Dr. Brigitte Pulse in [place] - lens calcs and IOL choice.  3.  Significant and tightening of skin - no only from laxity. Continue Lotemax 1 drop 1-2x/day as needed OD. Will refer to Oculoplastics for consult - appreciate his assistance with this very pleasant patient.  4.  Recommend regular lid cleaning.  5.  Observe; Artificial tears as needed for irritation.  6.  Asymptomatic, recommend observation for now. Findings, prognosis and treatment options  reviewed.

## 2021-02-18 DIAGNOSIS — G7001 Myasthenia gravis with (acute) exacerbation: Secondary | ICD-10-CM | POA: Diagnosis not present

## 2021-03-02 DIAGNOSIS — H25812 Combined forms of age-related cataract, left eye: Secondary | ICD-10-CM | POA: Diagnosis not present

## 2021-03-10 ENCOUNTER — Encounter (HOSPITAL_COMMUNITY)
Admission: RE | Admit: 2021-03-10 | Discharge: 2021-03-10 | Disposition: A | Discharge status: Home / Self Care | Payer: Medicare Other | Source: Ambulatory Visit | Attending: Ophthalmology | Admitting: Ophthalmology

## 2021-03-10 ENCOUNTER — Encounter (HOSPITAL_COMMUNITY): Payer: Self-pay

## 2021-03-10 ENCOUNTER — Other Ambulatory Visit: Payer: Self-pay

## 2021-03-10 DIAGNOSIS — G7001 Myasthenia gravis with (acute) exacerbation: Secondary | ICD-10-CM | POA: Diagnosis not present

## 2021-03-11 DIAGNOSIS — G7001 Myasthenia gravis with (acute) exacerbation: Secondary | ICD-10-CM | POA: Diagnosis not present

## 2021-03-12 ENCOUNTER — Ambulatory Visit (HOSPITAL_COMMUNITY): Payer: Medicare Other | Admitting: Anesthesiology

## 2021-03-12 ENCOUNTER — Encounter (HOSPITAL_COMMUNITY): Payer: Self-pay | Admitting: Ophthalmology

## 2021-03-12 ENCOUNTER — Encounter (HOSPITAL_COMMUNITY)
Admission: RE | Disposition: A | Discharge status: Home / Self Care | Payer: Self-pay | Source: Home / Self Care | Attending: Ophthalmology

## 2021-03-12 ENCOUNTER — Ambulatory Visit (HOSPITAL_COMMUNITY)
Admission: RE | Admit: 2021-03-12 | Discharge: 2021-03-12 | Disposition: A | Discharge status: Home / Self Care | Payer: Medicare Other | Attending: Ophthalmology | Admitting: Ophthalmology

## 2021-03-12 DIAGNOSIS — Z961 Presence of intraocular lens: Secondary | ICD-10-CM | POA: Insufficient documentation

## 2021-03-12 DIAGNOSIS — H0100A Unspecified blepharitis right eye, upper and lower eyelids: Secondary | ICD-10-CM | POA: Diagnosis not present

## 2021-03-12 DIAGNOSIS — H02831 Dermatochalasis of right upper eyelid: Secondary | ICD-10-CM | POA: Diagnosis not present

## 2021-03-12 DIAGNOSIS — Z87891 Personal history of nicotine dependence: Secondary | ICD-10-CM | POA: Diagnosis not present

## 2021-03-12 DIAGNOSIS — H25812 Combined forms of age-related cataract, left eye: Secondary | ICD-10-CM | POA: Diagnosis not present

## 2021-03-12 DIAGNOSIS — H02834 Dermatochalasis of left upper eyelid: Secondary | ICD-10-CM | POA: Diagnosis not present

## 2021-03-12 DIAGNOSIS — J449 Chronic obstructive pulmonary disease, unspecified: Secondary | ICD-10-CM | POA: Diagnosis not present

## 2021-03-12 DIAGNOSIS — H0100B Unspecified blepharitis left eye, upper and lower eyelids: Secondary | ICD-10-CM | POA: Insufficient documentation

## 2021-03-12 DIAGNOSIS — H02132 Senile ectropion of right lower eyelid: Secondary | ICD-10-CM | POA: Diagnosis not present

## 2021-03-12 DIAGNOSIS — H11153 Pinguecula, bilateral: Secondary | ICD-10-CM | POA: Insufficient documentation

## 2021-03-12 HISTORY — PX: CATARACT EXTRACTION W/PHACO: SHX586

## 2021-03-12 SURGERY — PHACOEMULSIFICATION, CATARACT, WITH IOL INSERTION
Anesthesia: Monitor Anesthesia Care | Site: Eye | Laterality: Left

## 2021-03-12 MED ORDER — STERILE WATER FOR IRRIGATION IR SOLN
Status: DC | PRN
  Administered 2021-03-12: 250 mL

## 2021-03-12 MED ORDER — PHENYLEPHRINE-KETOROLAC 1-0.3 % IO SOLN
INTRAOCULAR | Status: DC | PRN
  Administered 2021-03-12: 500 mL via OPHTHALMIC

## 2021-03-12 MED ORDER — PHENYLEPHRINE-KETOROLAC 1-0.3 % IO SOLN
INTRAOCULAR | Status: AC
  Filled 2021-03-12: qty 4

## 2021-03-12 MED ORDER — EPINEPHRINE PF 1 MG/ML IJ SOLN
INTRAMUSCULAR | Status: AC
  Filled 2021-03-12: qty 1

## 2021-03-12 MED ORDER — TROPICAMIDE 1 % OP SOLN
1.0000 [drp] | OPHTHALMIC | Status: DC | PRN
  Administered 2021-03-12 (×2): 1 [drp] via OPHTHALMIC
  Filled 2021-03-12: qty 2

## 2021-03-12 MED ORDER — PHENYLEPHRINE HCL 2.5 % OP SOLN
1.0000 [drp] | OPHTHALMIC | Status: DC | PRN
  Administered 2021-03-12 (×2): 1 [drp] via OPHTHALMIC

## 2021-03-12 MED ORDER — SODIUM HYALURONATE 23MG/ML IO SOSY
PREFILLED_SYRINGE | INTRAOCULAR | Status: DC | PRN
  Administered 2021-03-12: 0.6 mL via INTRAOCULAR

## 2021-03-12 MED ORDER — TETRACAINE HCL 0.5 % OP SOLN
1.0000 [drp] | OPHTHALMIC | Status: DC | PRN
  Administered 2021-03-12 (×2): 1 [drp] via OPHTHALMIC

## 2021-03-12 MED ORDER — LIDOCAINE HCL 3.5 % OP GEL: 1.0000 "application " | Freq: Once | OPHTHALMIC | Status: DC

## 2021-03-12 MED ORDER — SODIUM HYALURONATE 10 MG/ML IO SOLUTION
PREFILLED_SYRINGE | INTRAOCULAR | Status: DC | PRN
  Administered 2021-03-12: 0.85 mL via INTRAOCULAR

## 2021-03-12 MED ORDER — ONDANSETRON HCL 4 MG/2ML IJ SOLN
INTRAMUSCULAR | Status: DC | PRN
  Administered 2021-03-12: 4 mg via INTRAVENOUS

## 2021-03-12 MED ORDER — LIDOCAINE HCL (PF) 1 % IJ SOLN
INTRAOCULAR | Status: DC | PRN
  Administered 2021-03-12: 1 mL via OPHTHALMIC

## 2021-03-12 MED ORDER — NEOMYCIN-POLYMYXIN-DEXAMETH 3.5-10000-0.1 OP SUSP
OPHTHALMIC | Status: DC | PRN
  Administered 2021-03-12: 2 [drp] via OPHTHALMIC

## 2021-03-12 MED ORDER — POVIDONE-IODINE 5 % OP SOLN
OPHTHALMIC | Status: DC | PRN
  Administered 2021-03-12: 1 via OPHTHALMIC

## 2021-03-12 MED ORDER — BSS IO SOLN
INTRAOCULAR | Status: DC | PRN
  Administered 2021-03-12: 15 mL via INTRAOCULAR

## 2021-03-12 SURGICAL SUPPLY — 13 items
CLOTH BEACON ORANGE TIMEOUT ST (SAFETY) ×2 IMPLANT
EYE SHIELD UNIVERSAL CLEAR (GAUZE/BANDAGES/DRESSINGS) ×2 IMPLANT
GLOVE SURG UNDER POLY LF SZ6.5 (GLOVE) ×2 IMPLANT
GLOVE SURG UNDER POLY LF SZ7 (GLOVE) ×2 IMPLANT
NEEDLE HYPO 18GX1.5 BLUNT FILL (NEEDLE) ×2 IMPLANT
PAD ARMBOARD 7.5X6 YLW CONV (MISCELLANEOUS) ×2 IMPLANT
RING MALYGIN (MISCELLANEOUS) IMPLANT
RING MALYGIN 7.0 (MISCELLANEOUS) IMPLANT
RayOne EMV US (Intraocular Lens) ×2 IMPLANT
SYR TB 1ML LL NO SAFETY (SYRINGE) ×2 IMPLANT
TAPE SURG TRANSPORE 1 IN (GAUZE/BANDAGES/DRESSINGS) ×1 IMPLANT
TAPE SURGICAL TRANSPORE 1 IN (GAUZE/BANDAGES/DRESSINGS) ×2
WATER STERILE IRR 250ML POUR (IV SOLUTION) ×2 IMPLANT

## 2021-03-12 NOTE — Anesthesia Postprocedure Evaluation (Signed)
Anesthesia Post Note  Patient: Calvin Sloan  Procedure(s) Performed: CATARACT EXTRACTION PHACO AND INTRAOCULAR LENS PLACEMENT (IOC) (Left: Eye)  Patient location during evaluation: Phase II Anesthesia Type: MAC Level of consciousness: awake and alert and oriented Pain management: pain level controlled Vital Signs Assessment: post-procedure vital signs reviewed and stable Respiratory status: spontaneous breathing, nonlabored ventilation and respiratory function stable Cardiovascular status: stable and blood pressure returned to baseline Postop Assessment: no apparent nausea or vomiting Anesthetic complications: no   No notable events documented.   Last Vitals:  Vitals:   03/12/21 0719 03/12/21 0832  BP: (!) 150/67 (!) 158/77  Pulse: (!) 56 (!) 54  Resp: 16 16  Temp: 36.8 Calvin 36.5 Calvin  SpO2: 98% 95%    Last Pain:  Vitals:   03/12/21 0832  TempSrc: Oral  PainSc: 0-No pain                 Calvin Sloan Calvin Sloan

## 2021-03-12 NOTE — Op Note (Signed)
Date of procedure: 03/12/21  Pre-operative diagnosis: Visually significant age-related combined cataract, Left Eye (H25.812)  Post-operative diagnosis: Visually significant age-related combined cataract, Left Eye (H25.812)  Procedure: Removal of cataract via phacoemulsification and insertion of intra-ocular lens Rayner [id number] +19.0D into the capsular bag of the Left Eye  Attending surgeon: Gerda Diss. Nicolina Hirt, MD, MA  Anesthesia: MAC, Topical Akten  Complications: None  Estimated Blood Loss: <49m (minimal)  Specimens: None  Implants: As above  Indications:  Visually significant age-related cataract, Left Eye  Procedure:  The patient was seen and identified in the pre-operative area. The operative eye was identified and dilated.  The operative eye was marked.  Topical anesthesia was administered to the operative eye.     The patient was then to the operative suite and placed in the supine position.  A timeout was performed confirming the patient, procedure to be performed, and all other relevant information.   The patient's face was prepped and draped in the usual fashion for intra-ocular surgery.  A lid speculum was placed into the operative eye and the surgical microscope moved into place and focused.  An inferotemporal paracentesis was created using a 20 gauge paracentesis blade.  Shugarcaine was injected into the anterior chamber.  Viscoelastic was injected into the anterior chamber.  A temporal clear-corneal main wound incision was created using a 2.428mmicrokeratome.  A continuous curvilinear capsulorrhexis was initiated using an irrigating cystitome and completed using capsulorrhexis forceps.  Hydrodissection and hydrodeliniation were performed.  Viscoelastic was injected into the anterior chamber.  A phacoemulsification handpiece and a chopper as a second instrument were used to remove the nucleus and epinucleus. The irrigation/aspiration handpiece was used to remove any remaining  cortical material.   The capsular bag was reinflated with viscoelastic, checked, and found to be intact.  The intraocular lens was inserted into the capsular bag.  The irrigation/aspiration handpiece was used to remove any remaining viscoelastic.  The clear corneal wound and paracentesis wounds were then hydrated and checked with Weck-Cels to be watertight.  The lid-speculum was removed.  The drape was removed.  The patient's face was cleaned with a wet and dry 4x4.   Maxitrol was instilled in the eye. A clear shield was taped over the eye. The patient was taken to the post-operative care unit in good condition, having tolerated the procedure well.  Post-Op Instructions: [facility] and will receive all other orders and instructions.

## 2021-03-12 NOTE — Transfer of Care (Signed)
Immediate Anesthesia Transfer of Care Note  Patient: Calvin Sloan  Procedure(s) Performed: CATARACT EXTRACTION PHACO AND INTRAOCULAR LENS PLACEMENT (IOC) (Left: Eye)  Patient Location: Short Stay  Anesthesia Type:MAC  Level of Consciousness: awake, alert  and oriented  Airway & Oxygen Therapy: Patient Spontanous Breathing  Post-op Assessment: Report given to RN and Post -op Vital signs reviewed and stable  Post vital signs: Reviewed and stable  Last Vitals:  Vitals Value Taken Time  BP    Temp    Pulse    Resp    SpO2      Last Pain:  Vitals:   03/12/21 0719  TempSrc: Oral  PainSc: 5       Patients Stated Pain Goal: 5 (89/37/34 2876)  Complications: No notable events documented.

## 2021-03-12 NOTE — Anesthesia Preprocedure Evaluation (Signed)
Anesthesia Evaluation  Patient identified by MRN, date of birth, ID band Patient awake    Reviewed: Allergy & Precautions, NPO status , Patient's Chart, lab work & pertinent test results  History of Anesthesia Complications (+) PONV and history of anesthetic complications  Airway Mallampati: I  TM Distance: >3 FB Neck ROM: Full    Dental  (+) Dental Advisory Given, Teeth Intact   Pulmonary shortness of breath and with exertion, COPD, former smoker, PE   Pulmonary exam normal breath sounds clear to auscultation       Cardiovascular hypertension, Pt. on medications Normal cardiovascular exam Rhythm:Regular Rate:Normal     Neuro/Psych PSYCHIATRIC DISORDERS (memory loss) TIA Neuromuscular disease (Myasthenia gravis)    GI/Hepatic negative GI ROS, Neg liver ROS,   Endo/Other  negative endocrine ROS  Renal/GU Renal InsufficiencyRenal disease  negative genitourinary   Musculoskeletal  (+) Arthritis ,   Abdominal   Peds negative pediatric ROS (+)  Hematology negative hematology ROS (+) anemia ,   Anesthesia Other Findings   Reproductive/Obstetrics negative OB ROS                            Anesthesia Physical Anesthesia Plan  ASA: 3  Anesthesia Plan: MAC   Post-op Pain Management:    Induction:   PONV Risk Score and Plan:   Airway Management Planned: Nasal Cannula and Natural Airway  Additional Equipment:   Intra-op Plan:   Post-operative Plan:   Informed Consent: I have reviewed the patients History and Physical, chart, labs and discussed the procedure including the risks, benefits and alternatives for the proposed anesthesia with the patient or authorized representative who has indicated his/her understanding and acceptance.     Dental advisory given  Plan Discussed with: CRNA and Surgeon  Anesthesia Plan Comments:         Anesthesia Quick Evaluation

## 2021-03-12 NOTE — Discharge Instructions (Signed)
Please discharge patient when stable, will follow up today with Dr. Smayan Hackbart at the [facility] [place] office immediately following discharge.  Leave shield in place until visit.  All paperwork with discharge instructions will be given at the office.   [facility] Address:  [address]  [place], [place] [address]  

## 2021-03-12 NOTE — Interval H&P Note (Signed)
History and Physical Interval Note:  03/12/2021 8:00 AM  Calvin Sloan  has presented today for surgery, with the diagnosis of Nuclear sclerotic cataract - Left eye.  The various methods of treatment have been discussed with the patient and family. After consideration of risks, benefits and other options for treatment, the patient has consented to  Procedure(s) with comments: CATARACT EXTRACTION PHACO AND INTRAOCULAR LENS PLACEMENT (IOC) (Left) - CDE- as a surgical intervention.  The patient's history has been reviewed, patient examined, no change in status, stable for surgery.  I have reviewed the patient's chart and labs.  Questions were answered to the patient's satisfaction.     Baruch Goldmann

## 2021-03-13 ENCOUNTER — Encounter (HOSPITAL_COMMUNITY): Payer: Self-pay | Admitting: Ophthalmology

## 2021-03-13 DIAGNOSIS — I4891 Unspecified atrial fibrillation: Secondary | ICD-10-CM | POA: Diagnosis not present

## 2021-03-27 DIAGNOSIS — M1712 Unilateral primary osteoarthritis, left knee: Secondary | ICD-10-CM | POA: Diagnosis not present

## 2021-03-27 DIAGNOSIS — M1732 Unilateral post-traumatic osteoarthritis, left knee: Secondary | ICD-10-CM | POA: Diagnosis not present

## 2021-04-01 DIAGNOSIS — G7001 Myasthenia gravis with (acute) exacerbation: Secondary | ICD-10-CM | POA: Diagnosis not present

## 2021-04-02 DIAGNOSIS — G7001 Myasthenia gravis with (acute) exacerbation: Secondary | ICD-10-CM | POA: Diagnosis not present

## 2021-04-13 DIAGNOSIS — I4891 Unspecified atrial fibrillation: Secondary | ICD-10-CM | POA: Diagnosis not present

## 2021-04-13 DIAGNOSIS — I82401 Acute embolism and thrombosis of unspecified deep veins of right lower extremity: Secondary | ICD-10-CM | POA: Diagnosis not present

## 2021-04-13 DIAGNOSIS — Z86711 Personal history of pulmonary embolism: Secondary | ICD-10-CM | POA: Diagnosis not present

## 2021-04-20 DIAGNOSIS — I82401 Acute embolism and thrombosis of unspecified deep veins of right lower extremity: Secondary | ICD-10-CM | POA: Diagnosis not present

## 2021-04-20 DIAGNOSIS — I4891 Unspecified atrial fibrillation: Secondary | ICD-10-CM | POA: Diagnosis not present

## 2021-04-21 ENCOUNTER — Ambulatory Visit (INDEPENDENT_AMBULATORY_CARE_PROVIDER_SITE_OTHER): Payer: Medicare Other | Admitting: Neurology

## 2021-04-21 ENCOUNTER — Encounter: Payer: Self-pay | Admitting: Neurology

## 2021-04-21 VITALS — BP 145/70 | HR 70 | Ht 69.0 in | Wt 206.0 lb

## 2021-04-21 DIAGNOSIS — R1310 Dysphagia, unspecified: Secondary | ICD-10-CM | POA: Diagnosis not present

## 2021-04-21 DIAGNOSIS — G7001 Myasthenia gravis with (acute) exacerbation: Secondary | ICD-10-CM | POA: Diagnosis not present

## 2021-04-21 DIAGNOSIS — R0602 Shortness of breath: Secondary | ICD-10-CM

## 2021-04-21 NOTE — Progress Notes (Signed)
ASSESSMENT AND PLAN 87 y.o. year old male    Seropositive myasthenia gravis with exacerbation, Following his COVID diagnosis in April 2022,, increased difficulty with swallowing, general weakness Continued complaints of weakness following 6 days of Medrol pack, higher dose of Mestinon, restarted on IVIG treatment, loading dose on May 18, followed by 1 g/kg maintenance dose June 15, November 18, 2020, He was doing well in July, stopped the maintenance dose of IVIG, now complains of a week history of worsening swallowing difficulty, mild slurred speech,  restarted IVIG 1 g/kg since August  Not a good candidate for long-term immunosuppressive treatment, due to previous history of extensive skin cancer, could not tolerate CellCept, Imuran  Dysphagia  He complains of slight worsening swallowing difficulty, especially at the end of third week, tolerating IVIG infusion well,  Will continue IVIG infusion 1 g/kg divided into 2-day infusion every 3 weeks,  Swallowing study  Shortness of breath  Baseline intermittent shortness of breath with exertion, this has been a problem since his PE in 2003, on chronic Coumadin treatment     DIAGNOSTIC DATA (LABS, IMAGING, TESTING) - I reviewed patient records, labs, notes, testing and imaging myself     HISTORY OF PRESENT ILLNESS:  Calvin Sloan  is a 87 -year-old right-handed white married male with generalized seropositive myasthenia gravis characterized by bulbar weakness, shortness of breath , and dysphagia. Patient of [name]   He was diagnosed in 10/2002 with a  positive Tensilon test, positive acetylchoine receptor antibody,and had a positive ANA at 1-160 speckled pattern. Initially he had right eye ptosis and subsequently, double vision, fatigue with chewing, dysphagia, and upper and lower extremity weakness. He was initially treated with Mestinon bromide in June 2004,  which was associated with GI side effects.   Prednisone was started in 03/29/2003  and CellCept 05/2003. He developed increased jitteriness, rash, and cramps on CellCept which was discontinued 10/2003. He had progressive bulbar weakness and was admitted 12/2003 for IV IgG therapy which required a PEG tube for dysphagia transiently. He was started on Imuran. 01/17/2004 he was readmitted for 2 days of IV IgG because of shortness of breath. He has not been hospitalized since 01/2004.    He has been slowly tapered off prednisone 09/2007 and was on Imuran and Mestinon bromide.    He had B12 deficiency and was receiving B12 shots ,but  B12 levels off injections have been normal and B12 shots were discontinued.   He has  a history of vague visual loss on the left of unknown etiology. He denies swallowing problems, speech problems, voice changes, focal weakness, or double vision.   He is independent in activities of daily living and takes one nap per day.He denies memory loss but his wife states that he does have memory loss. His last blood studies in my office 05/27/10 were normal except a low white blood cell count of 3900.   His dermatologist [name] was concerned that the Imuran is associated with increased risk of skin cancer and the imuran was discontinued in 12/30/2010. His skin cancer (basal cell cancer) has improved since his imuran was stopped.   He has noted no change In myasthenic symptoms. His skin has improved. His medication for myasthenia is mesinon bromide 60  milligrams one and one half tablets 4 times a day. He has shortness of breath since his pulmonary emboli in 2003, also complains of excessive fatigue.    He is allergic to aspirin, GI side effect,  gastric ulcer,  and is on Plavix for a history of TIAs with right arm weakness He has  swallowing problems at the beginning of the meal with liquids that improves as the meal continues.     He had right CTS release in May 2014 by [name].   He complains of shortness of breath with walking, bending over to tire his  shoed, due to PE 2003, pulmnolgist folllowed up, he does not like to chew on tough beef, he denies swallowing difficulty, no double vision, no droopy eyelid, no significant bleeding muscle weakness. He is active in yard, shop, threadmill     UPDATE Jan 16th 2015: He is now tapering off Mestinon, there was no significant change in his functional status, he denies double vision, no ptosis, continue complains of shortness of breath with exertion no significant and muscle weakness, he has to wash down his food sometimes, but no significant swelling, or chewing difficulty, no dysarthria   UPDATE July 16th 2015: He is no longer mestinon, he does complains of increased breathing difficulty from prolonged walking, no chewing difficulty, no double vision, no ptosis.   Laboratory continue to demonstrate positive acetylcholine binding, and modulating antibodies.  He did reported that his skin cancer has improved after stopping imuran, could not tolerate cellcept per record.  Previously he responded to IVIG very well.   UPDATE Sept 25th 2015: He was stated on prednisone 10mg  qday, and mestinon 60mg  tid in June 2015, which did help his fatigue, shortness of breath, wife reported 50% improvement, he has increased appetite   But he was admitted to [facility] in Sept 17th for bilateral PEs, right leg DVT, he is started on coumadin, bridge with Lovenox, [name] is monitoring his INR   He denies double vision, no ptosis, no limb muscle weakness, no dysphagia   UPDATE April 4th 2016: He is on coumadin for PE, INR was monitored by [name], his myasthenia gravis is under good control, is taking prednisone 10 mg every day, he has surgery in Nov 2015 for right leg cellulitis.   He only does little around his house, he has no double vision, SOB due to previous PE,    UPDATE Feb 18 2015: Previous laboratory evaluation showed mild B12 deficiency with level of 204, He did receive IM B12 supplement at his  primary care Dr. [name] office for a while, now with normal repeat laboratory evaluation, injection has stopped He has mild choking difficulty, this has been going on since August 2016, not getting worse,   he has more trouble with liquid. He complains of shortness of breath when bending over. He denies significant gait difficulty, no breathing difficulty otherwise, he denies double vision, no ptosis,, No chewing difficulties     UPDATE May 22 2015: He came in last visit April 16 2015 complains of worsening blurry vision, double vision, swallowing difficulties, trouble talking, he reported 50% weakness compared to baseline, difficulty getting up from seated position, difficulty chewing, worsening shortness of breath, on examination, he was noted to have increased weakness compared to previous examination, he had moderate eye-closure cheek puff, mild neck flexion weakness, mild bilateral shoulder abduction bilateral hip flexion weakness.   He was treated with IVIG 400 mg/kg every day for 5 days from December 11 to May 02 2015, his vision has much improved, he can walk better, but he continue have swallowing difficulty, dysarthria, he is also taking prednisone 40 mg every day, increase his Mestinon 60 mg to 3-4  tablets daily   He was not able to tolerate immunosuppression treatment in the past due to skin cancer   UPDATE Jul 07 2015:   I have called his wife, pulmonary function test in June 05 2015.   1, FVC is 3.1 5, 87%, this is normal 2. FEV1 is 2.1 6, 77%, with a percent FEV1 of 68, this indicated mild to moderate primary small  obstruction in this patient was a history of cigarette smoking, large elderly flow rates are generally normal   3. Flow volume loop is unremarkable 4, lung volumes indicate some evidence of air trapping with an increased residual volume, the TLC is normal, the increased residual volume confirms the presence of airflow obstruction   5, DLCO is 19.0, 90%, this  is normal 6, elderly resistant 3.7 2, 265%, this is increased   Wife reported, he recently had swallowing study, there was no significant abnormality found, his swallowing, breathing, symptoms overall has improved   He did think that IVIG treatment in Dec 11-16th 2016 has helped his blurry vision, it is gone, he still feel straggled sometimes, he has shortness with exertion and bending over. His talking is much better.   UPDATE May 22nd 2017: He continue complains of shortness of breath with minimum exertion, no diplopia, no double vision, mild dysphasia,   He had a history of right carpal tunnel release surgery in the past, which has been helpful, now complains of left first 3 finger paresthesia, EMG nerve conduction studies was planned at local hospital   UPDATE July 6th 2017: He stopped taking Mestinon around May 20 third 2017, shortly afterwards, he noticed double vision, he clearly describe eighth of binocular double vision, sometimes vertigo, sometimes horizontal, he went on higher dose of Mestinon 60 mg 4 times a day without improving his symptoms, he slept on a bed with 30 head raise, with no significant breathing difficulty, he could not eat his dinner on July 5th 2017 due to chewing swallowing difficulty, he could not fly flat sleeping, mildly unsteady gait, increased fatigue,   UPDATE August 17th 2017: Last IVIG treatment was December 05 2015, he did very well for a while, was able to cut grass, trim his lawn, it did helped him for at least 2 weeks, then he had passing out    I reviewed laboratory evaluation in August 2017, normal B12, ferritin level, iron binding capacity was within normal limit, low hemoglobin 10 point 8, creatinine was elevated 1.53, glucose was 125   Previous laboratory evaluation in 2016 showed normal folic acid 12 point 5,low normal B12 level 266, hemoglobin in January 2015 was 14 point 3   He will be evaluated by hematologist/oncologist Dr. Mervin Kung in September 6th  2017, at [facility]   He complains of low back pain since he fell you August 1st 2017, has been taking intermittent Tylenol,   He complains of difficulty swallowing, both liquid, and solid food, no double vision, He is taking mestinon 60mg  4 times a day, which did help him.   Update January 29 2016: I have reviewed his hematologist Dr. Grier Mitts note on September 6th 2017, normocytic microchromic anemia, normal WBC, platelet count, likely from chronic kidney disease, history of fetal deficiency, is on B12 supplement, there was a consideration of low level hemolytic anemia from IVIG infusion, no overt evidence of blood loss   I reviewed laboratory evaluation in September 2017, parietal cell antibody IgG was negative, intrinsic factor negative, mild elevated kappa free light chain,  Hg 13.8, creat 1.38, GFR 47, ferritin 163, normal TSh 1.5, elevated vitamin B12 1480   He complains more fatigue, more swallowing difficulty, decreased po intake, he could not drinking water because worry about the choking episode, increased shortness of breath with exertion, intermittent blurry version, difficulty closing his eyes.   He is now taking mestinon 60mg  qid, no significant side effect, was not sure about the benefit.   Also reviewed swallowing evaluation on June 06 2015 at [facility], there was mild oropharyngeal dysphagia, characterized by spillover to the valleculae and the piriform sinus secondary to reduced oral motor coordination    patient was felt to safe to consume a regular diet with a full range of liquid with  recommendation of upright for food by mouth, and 45 minutes after meals, head flexed slightly forward , swallow at slower rate   UPDATE Oct 16th 2017: I reviewed laboratory in October 2017, immune of fixative electrophoresis showed mild elevated Ig G2.8, otherwise was normal, mild anemia hemoglobin 11.7, mild elevated kappa.free light chain 26, mild elevated  creatinine 1.38, normal ferritin 163, normal iron panel, TSH, folic acid, L95 3202,   Last ivig was in Sept, it has helped him 80%,  he can chew and swallow, he continue complains of blurry vision,   UPDATE May 05 2016: He is overall much improved, but still complains of SOB, is at his baseline, last infusion was on Nov 27, 28th, he will have more infusion in December, he has mild dysarthria, slight dysphagia will see hematologist Dr. Whitney Muse in January 2018,    UPDATE Oct 04 2018: Patient complains of gradual onset swallowing difficulty since April 2020, to the point he has to modify his daily diet, he can only eat soft food, also increased dyspnea upon exertion, has to cut back his daily activity, he denies double vision, no droopy eyelid, denies difficulty to close his eyes tightly, he denies significant limb muscle weakness,   Update December 13, 2018: He is accompanied by his wife at today's clinic visit, he had received IVIG treatment in June, July, planning on receiving it again on August 3 to 10/2018, he tolerated infusion well, noticed moderate to significant improvement of his muscle weakness, 80% back to his baseline, he can swallowing better, move better, but continue noticed mild bilateral upper extremity weakness, taking Mestinon 4 times a day, prednisone 10 mg daily   UPDATE July 16 2019: He had IVIG treatment in June, July, August, September 2020, after that, he was able to bounce back to baseline, gradually tapered off prednisone treatment,   He is still taking Mestinon 60 mg 4 times a day, complains of shortness of breath occasionally denied double vision, swallowing difficulty  UPDATE Mar 19 2020: Because of the increased swallowing difficulty, double vision, he received IVIG treatment loading dose 2 g/kg in September, maintenance dose in October, he reported significant improvement, now close to baseline, gained 7 pounds over the past few months, has third infusion planned in  November  He only take Mestinon as needed, no longer taking prednisone,  UPDATE December 02 2020:  Following his COVID infection on August 28, 2020, presented with cough, stomachache, increased difficulty breathing, also increased swallowing, dysarthria, despite taking higher dose of Mestinon 1.5 mg 3 times a day, also complains of generalized weakness, decreased mobility  He was noted to have mild neck flexion, proximal upper lower extremity muscle weakness at last visit on Sep 16, 2020 with several, we decided to proceed with IVIG,  Loading dose 2 g/kg on May 18, followed by maintenance dose 1 g/kg on June 15, November 18, 2020  Now he feels much better, back to his baseline, still has frequent cough, mild shortness of breath, on residual deficit of his PE, no longer has swallowing difficulty, getting stronger, no significant gait abnormality,   UPDATE December 29 2020: He was doing so well last visit on December 02, 2020, we decided to hold off IVIG maintenance dose 1 g/kg, he reported a week history of gradually worsening swallowing chewing difficulty, despite taking Mestinon up to 60 mg 3 times a day, he described difficulty to swallow down food, has to wash down with drinks, induced cough sometimes also noticed mild increased slurred voice,  He had a history of PE, taking Coumadin, shortness of breath at baseline, no significant change, no significant gait abnormality, he denies double vision  Update April 21, 2021 He is accompanied by his wife at today's visit, tolerating IVIG 1 g/kg every 4 weeks, continue to require Mestinon 60 mg 4 times a day, still complains of frequent fatigue, difficulty getting up from seated position, mild swallowing difficulty especially at the end of 3 weeks, difficulty closing eyes, frequent tearing, denies significant double vision  PHYSICAL EXAM  Vitals:   12/02/20 1255  BP: (!) 149/60  Pulse: (!) 59  Weight: 209 lb 8 oz (95 kg)  Height: 5\' 9"  (1.753 m)   Body  mass index is 30.94 kg/m.  PHYSICAL EXAMNIATION:  Gen: NAD, conversant, well nourised, well groomed                     Cardiovascular: Regular rate rhythm, no peripheral edema, warm, nontender. Eyes: Conjunctivae clear without exudates or hemorrhage Pulmonary: Clear to auscultation bilaterally   NEUROLOGICAL EXAM:  MENTAL STATUS: Speech/Cognition: Awake, alert, mild dysarthria, hoarse voice, oriented to history taking and casual conversation.  CRANIAL NERVES: CN II: Visual fields are full to confrontation.  Pupils are small and reactive to light CN III, IV, VI: extraocular movement are normal. No ptosis. CN V: Facial sensation is intact to light touch. CN VII: moderate eye closure, cheek puff weakness CN VIII: Hearing is normal to casual conversation CN XI: Head turning and shoulder shrug are intact  MOTOR: He has mild neck flexion weakness, mild bilateral upper extremity proximal muscle weakness, slight bilateral lower extremity proximal muscle weakness  REFLEXES: Hyporreflexia  SENSORY: Intact to light touch  COORDINATION: Finger-nose-finger and heel-to-shin is normal  GAIT/STANCE: Able to get up from seated position arm crossed, steady,  REVIEW OF SYSTEMS:  Out of a complete 14 system review of symptoms, the patient complains only of the following symptoms, and all other reviewed systems are negative.  See HPI  ALLERGIES: Allergies  Allergen Reactions   Aspirin Nausea Only   Shellfish Allergy Swelling   Tramadol Nausea Only and Rash    HOME MEDICATIONS: Outpatient Medications Prior to Visit  Medication Sig Dispense Refill   metroNIDAZOLE (METROGEL) 1 % gel Apply 1 application topically daily.     prednisoLONE acetate (PRED FORTE) 1 % ophthalmic suspension Place 1 drop into the left eye in the morning and at bedtime.     pyridostigmine (MESTINON) 60 MG tablet Take 1 tablet (60 mg total) by mouth 3 (three) times daily. (Patient taking differently: Take 60 mg by  mouth 4 (four) times daily.) 90 tablet 11   valsartan-hydrochlorothiazide (DIOVAN-HCT) 320-25 MG per tablet Take 0.5 tablets by mouth daily.     warfarin (  COUMADIN) 5 MG tablet Take 5-7.5 mg by mouth. Take 7.5mg  by mouth on Tuesday and Thursday and take 5 mg on Sunday, Monday, Wednesday, Friday and Saturday.     No facility-administered medications prior to visit.    PAST MEDICAL HISTORY: Past Medical History:  Diagnosis Date   B12 deficiency    COPD (chronic obstructive pulmonary disease) ([place])    Essential tremor    Hypertension    Memory deficit    Myasthenia gravis ([place])    since 2004   PONV (postoperative nausea and vomiting)    Pulmonary emboli ([place])    in Nov 2003   Skin cancer     PAST SURGICAL HISTORY: Past Surgical History:  Procedure Laterality Date   CARPAL TUNNEL RELEASE Right 09/14/2012   Procedure: CARPAL TUNNEL RELEASE;  Surgeon: Cammie Sickle., MD;  Location: [facility];  Service: Orthopedics;  Laterality: Right;   CARPAL TUNNEL RELEASE Left 11/04/2015   Procedure: LEFT CARPAL TUNNEL RELEASE, EXTENDED INCISION;  Surgeon: Daryll Brod, MD;  Location: [facility];  Service: Orthopedics;  Laterality: Left;  ANESTHESIA: IV REGIONAL UPPER ARM   CATARACT EXTRACTION Right    CATARACT EXTRACTION W/PHACO Left 03/12/2021   Procedure: CATARACT EXTRACTION PHACO AND INTRAOCULAR LENS PLACEMENT (IOC);  Surgeon: Baruch Goldmann, MD;  Location: [facility];  Service: Ophthalmology;  Laterality: Left;  CDE-42.07   CHOLECYSTECTOMY  2012   [facility]   COLONOSCOPY     FOOT SURGERY  1966   lt orif foot   SKIN CANCER EXCISION      FAMILY HISTORY: Family History  Problem Relation Age of Onset   Heart disease Mother    Heart disease Father     SOCIAL HISTORY: Social History   Socioeconomic History   Marital status: Married    Spouse name: [name]   Number of children: 2   Years of education: 12   Highest education level: Not on file  Occupational  History   Occupation: Plant    Comment: Retired  Tobacco Use   Smoking status: Former    Packs/day: 0.25    Years: 10.00    Pack years: 2.50    Types: Cigarettes    Quit date: 05/17/1968    Years since quitting: 52.9   Smokeless tobacco: Never  Vaping Use   Vaping Use: Never used  Substance and Sexual Activity   Alcohol use: No   Drug use: No   Sexual activity: Yes  Other Topics Concern   Not on file  Social History Narrative   He lives with his wife [name](, has 2 children, retired.    Hard of hearing.   Right handed.   Caffeine- None   Social Determinants of Health   Financial Resource Strain: Not on file  Food Insecurity: Not on file  Transportation Needs: Not on file  Physical Activity: Not on file  Stress: Not on file  Social Connections: Not on file  Intimate Partner Violence: Not on file

## 2021-04-22 DIAGNOSIS — G7001 Myasthenia gravis with (acute) exacerbation: Secondary | ICD-10-CM | POA: Diagnosis not present

## 2021-04-23 DIAGNOSIS — G7001 Myasthenia gravis with (acute) exacerbation: Secondary | ICD-10-CM | POA: Diagnosis not present

## 2021-05-01 DIAGNOSIS — Z86711 Personal history of pulmonary embolism: Secondary | ICD-10-CM | POA: Diagnosis not present

## 2021-05-01 DIAGNOSIS — Z6833 Body mass index (BMI) 33.0-33.9, adult: Secondary | ICD-10-CM | POA: Diagnosis not present

## 2021-05-06 ENCOUNTER — Other Ambulatory Visit: Payer: Self-pay | Admitting: Neurology

## 2021-05-06 DIAGNOSIS — H02112 Cicatricial ectropion of right lower eyelid: Secondary | ICD-10-CM | POA: Diagnosis not present

## 2021-05-06 DIAGNOSIS — H02202 Unspecified lagophthalmos right lower eyelid: Secondary | ICD-10-CM | POA: Diagnosis not present

## 2021-05-06 DIAGNOSIS — H02132 Senile ectropion of right lower eyelid: Secondary | ICD-10-CM | POA: Diagnosis not present

## 2021-05-06 DIAGNOSIS — G7000 Myasthenia gravis without (acute) exacerbation: Secondary | ICD-10-CM | POA: Diagnosis not present

## 2021-05-06 DIAGNOSIS — H04123 Dry eye syndrome of bilateral lacrimal glands: Secondary | ICD-10-CM | POA: Diagnosis not present

## 2021-05-06 DIAGNOSIS — H04521 Eversion of right lacrimal punctum: Secondary | ICD-10-CM | POA: Diagnosis not present

## 2021-05-06 DIAGNOSIS — H02232 Paralytic lagophthalmos right lower eyelid: Secondary | ICD-10-CM | POA: Diagnosis not present

## 2021-05-06 DIAGNOSIS — H04221 Epiphora due to insufficient drainage, right lacrimal gland: Secondary | ICD-10-CM | POA: Diagnosis not present

## 2021-05-06 DIAGNOSIS — H02231 Paralytic lagophthalmos right upper eyelid: Secondary | ICD-10-CM | POA: Diagnosis not present

## 2021-05-06 DIAGNOSIS — H16211 Exposure keratoconjunctivitis, right eye: Secondary | ICD-10-CM | POA: Diagnosis not present

## 2021-05-06 DIAGNOSIS — H02532 Eyelid retraction right lower eyelid: Secondary | ICD-10-CM | POA: Diagnosis not present

## 2021-05-11 DIAGNOSIS — Z6833 Body mass index (BMI) 33.0-33.9, adult: Secondary | ICD-10-CM | POA: Diagnosis not present

## 2021-05-11 DIAGNOSIS — N189 Chronic kidney disease, unspecified: Secondary | ICD-10-CM | POA: Diagnosis not present

## 2021-05-11 DIAGNOSIS — I10 Essential (primary) hypertension: Secondary | ICD-10-CM | POA: Diagnosis not present

## 2021-05-11 DIAGNOSIS — I4891 Unspecified atrial fibrillation: Secondary | ICD-10-CM | POA: Diagnosis not present

## 2021-05-11 DIAGNOSIS — G7000 Myasthenia gravis without (acute) exacerbation: Secondary | ICD-10-CM | POA: Diagnosis not present

## 2021-05-11 DIAGNOSIS — Z86711 Personal history of pulmonary embolism: Secondary | ICD-10-CM | POA: Diagnosis not present

## 2021-05-11 DIAGNOSIS — E559 Vitamin D deficiency, unspecified: Secondary | ICD-10-CM | POA: Diagnosis not present

## 2021-05-11 DIAGNOSIS — R7303 Prediabetes: Secondary | ICD-10-CM | POA: Diagnosis not present

## 2021-05-12 DIAGNOSIS — G7001 Myasthenia gravis with (acute) exacerbation: Secondary | ICD-10-CM | POA: Diagnosis not present

## 2021-05-19 DIAGNOSIS — I4891 Unspecified atrial fibrillation: Secondary | ICD-10-CM | POA: Diagnosis not present

## 2021-05-26 DIAGNOSIS — I4891 Unspecified atrial fibrillation: Secondary | ICD-10-CM | POA: Diagnosis not present

## 2021-05-26 DIAGNOSIS — I2699 Other pulmonary embolism without acute cor pulmonale: Secondary | ICD-10-CM | POA: Diagnosis not present

## 2021-05-29 DIAGNOSIS — C44222 Squamous cell carcinoma of skin of right ear and external auricular canal: Secondary | ICD-10-CM | POA: Diagnosis not present

## 2021-05-29 DIAGNOSIS — D485 Neoplasm of uncertain behavior of skin: Secondary | ICD-10-CM | POA: Diagnosis not present

## 2021-05-29 DIAGNOSIS — R229 Localized swelling, mass and lump, unspecified: Secondary | ICD-10-CM | POA: Diagnosis not present

## 2021-05-29 DIAGNOSIS — Z85828 Personal history of other malignant neoplasm of skin: Secondary | ICD-10-CM | POA: Diagnosis not present

## 2021-05-29 DIAGNOSIS — C44329 Squamous cell carcinoma of skin of other parts of face: Secondary | ICD-10-CM | POA: Diagnosis not present

## 2021-05-29 DIAGNOSIS — C44229 Squamous cell carcinoma of skin of left ear and external auricular canal: Secondary | ICD-10-CM | POA: Diagnosis not present

## 2021-05-29 DIAGNOSIS — Z08 Encounter for follow-up examination after completed treatment for malignant neoplasm: Secondary | ICD-10-CM | POA: Diagnosis not present

## 2021-06-02 DIAGNOSIS — I4891 Unspecified atrial fibrillation: Secondary | ICD-10-CM | POA: Diagnosis not present

## 2021-06-08 DIAGNOSIS — G7001 Myasthenia gravis with (acute) exacerbation: Secondary | ICD-10-CM | POA: Diagnosis not present

## 2021-06-09 DIAGNOSIS — G7001 Myasthenia gravis with (acute) exacerbation: Secondary | ICD-10-CM | POA: Diagnosis not present

## 2021-06-11 ENCOUNTER — Ambulatory Visit: Payer: Medicare Other | Admitting: Neurology

## 2021-06-25 DIAGNOSIS — H04123 Dry eye syndrome of bilateral lacrimal glands: Secondary | ICD-10-CM | POA: Diagnosis not present

## 2021-06-29 DIAGNOSIS — G7001 Myasthenia gravis with (acute) exacerbation: Secondary | ICD-10-CM | POA: Diagnosis not present

## 2021-06-30 DIAGNOSIS — G7001 Myasthenia gravis with (acute) exacerbation: Secondary | ICD-10-CM | POA: Diagnosis not present

## 2021-07-01 DIAGNOSIS — I82401 Acute embolism and thrombosis of unspecified deep veins of right lower extremity: Secondary | ICD-10-CM | POA: Diagnosis not present

## 2021-07-01 DIAGNOSIS — I4891 Unspecified atrial fibrillation: Secondary | ICD-10-CM | POA: Diagnosis not present

## 2021-07-03 DIAGNOSIS — C44222 Squamous cell carcinoma of skin of right ear and external auricular canal: Secondary | ICD-10-CM | POA: Diagnosis not present

## 2021-07-03 DIAGNOSIS — D485 Neoplasm of uncertain behavior of skin: Secondary | ICD-10-CM | POA: Diagnosis not present

## 2021-07-03 DIAGNOSIS — C44319 Basal cell carcinoma of skin of other parts of face: Secondary | ICD-10-CM | POA: Diagnosis not present

## 2021-07-03 DIAGNOSIS — C44329 Squamous cell carcinoma of skin of other parts of face: Secondary | ICD-10-CM | POA: Diagnosis not present

## 2021-07-08 DIAGNOSIS — L0889 Other specified local infections of the skin and subcutaneous tissue: Secondary | ICD-10-CM | POA: Diagnosis not present

## 2021-07-17 DIAGNOSIS — C44229 Squamous cell carcinoma of skin of left ear and external auricular canal: Secondary | ICD-10-CM | POA: Diagnosis not present

## 2021-07-17 DIAGNOSIS — D485 Neoplasm of uncertain behavior of skin: Secondary | ICD-10-CM | POA: Diagnosis not present

## 2021-07-20 DIAGNOSIS — G7001 Myasthenia gravis with (acute) exacerbation: Secondary | ICD-10-CM | POA: Diagnosis not present

## 2021-07-21 DIAGNOSIS — G7001 Myasthenia gravis with (acute) exacerbation: Secondary | ICD-10-CM | POA: Diagnosis not present

## 2021-07-29 DIAGNOSIS — I4891 Unspecified atrial fibrillation: Secondary | ICD-10-CM | POA: Diagnosis not present

## 2021-07-29 DIAGNOSIS — M1732 Unilateral post-traumatic osteoarthritis, left knee: Secondary | ICD-10-CM | POA: Diagnosis not present

## 2021-07-31 DIAGNOSIS — C44222 Squamous cell carcinoma of skin of right ear and external auricular canal: Secondary | ICD-10-CM | POA: Diagnosis not present

## 2021-07-31 DIAGNOSIS — C4492 Squamous cell carcinoma of skin, unspecified: Secondary | ICD-10-CM | POA: Diagnosis not present

## 2021-07-31 DIAGNOSIS — Z481 Encounter for planned postprocedural wound closure: Secondary | ICD-10-CM | POA: Diagnosis not present

## 2021-07-31 DIAGNOSIS — C44619 Basal cell carcinoma of skin of left upper limb, including shoulder: Secondary | ICD-10-CM | POA: Diagnosis not present

## 2021-07-31 DIAGNOSIS — D485 Neoplasm of uncertain behavior of skin: Secondary | ICD-10-CM | POA: Diagnosis not present

## 2021-08-03 DIAGNOSIS — H02831 Dermatochalasis of right upper eyelid: Secondary | ICD-10-CM | POA: Diagnosis not present

## 2021-08-03 DIAGNOSIS — H02132 Senile ectropion of right lower eyelid: Secondary | ICD-10-CM | POA: Diagnosis not present

## 2021-08-03 DIAGNOSIS — H02834 Dermatochalasis of left upper eyelid: Secondary | ICD-10-CM | POA: Diagnosis not present

## 2021-08-03 DIAGNOSIS — H0014 Chalazion left upper eyelid: Secondary | ICD-10-CM | POA: Diagnosis not present

## 2021-08-03 DIAGNOSIS — Z961 Presence of intraocular lens: Secondary | ICD-10-CM | POA: Diagnosis not present

## 2021-08-10 DIAGNOSIS — G7001 Myasthenia gravis with (acute) exacerbation: Secondary | ICD-10-CM | POA: Diagnosis not present

## 2021-08-11 DIAGNOSIS — G7001 Myasthenia gravis with (acute) exacerbation: Secondary | ICD-10-CM | POA: Diagnosis not present

## 2021-08-18 DIAGNOSIS — C4442 Squamous cell carcinoma of skin of scalp and neck: Secondary | ICD-10-CM | POA: Diagnosis not present

## 2021-08-18 DIAGNOSIS — L905 Scar conditions and fibrosis of skin: Secondary | ICD-10-CM | POA: Diagnosis not present

## 2021-08-18 DIAGNOSIS — Z481 Encounter for planned postprocedural wound closure: Secondary | ICD-10-CM | POA: Diagnosis not present

## 2021-08-21 DIAGNOSIS — L089 Local infection of the skin and subcutaneous tissue, unspecified: Secondary | ICD-10-CM | POA: Diagnosis not present

## 2021-08-28 DIAGNOSIS — I2699 Other pulmonary embolism without acute cor pulmonale: Secondary | ICD-10-CM | POA: Diagnosis not present

## 2021-08-28 DIAGNOSIS — I4891 Unspecified atrial fibrillation: Secondary | ICD-10-CM | POA: Diagnosis not present

## 2021-08-31 DIAGNOSIS — G7001 Myasthenia gravis with (acute) exacerbation: Secondary | ICD-10-CM | POA: Diagnosis not present

## 2021-09-01 DIAGNOSIS — L718 Other rosacea: Secondary | ICD-10-CM | POA: Diagnosis not present

## 2021-09-01 DIAGNOSIS — C44319 Basal cell carcinoma of skin of other parts of face: Secondary | ICD-10-CM | POA: Diagnosis not present

## 2021-09-01 DIAGNOSIS — C44619 Basal cell carcinoma of skin of left upper limb, including shoulder: Secondary | ICD-10-CM | POA: Diagnosis not present

## 2021-09-01 DIAGNOSIS — G7001 Myasthenia gravis with (acute) exacerbation: Secondary | ICD-10-CM | POA: Diagnosis not present

## 2021-09-04 DIAGNOSIS — I82401 Acute embolism and thrombosis of unspecified deep veins of right lower extremity: Secondary | ICD-10-CM | POA: Diagnosis not present

## 2021-09-04 DIAGNOSIS — I4891 Unspecified atrial fibrillation: Secondary | ICD-10-CM | POA: Diagnosis not present

## 2021-09-10 DIAGNOSIS — Z87891 Personal history of nicotine dependence: Secondary | ICD-10-CM | POA: Diagnosis not present

## 2021-09-10 DIAGNOSIS — Z91013 Allergy to seafood: Secondary | ICD-10-CM | POA: Diagnosis not present

## 2021-09-10 DIAGNOSIS — R9082 White matter disease, unspecified: Secondary | ICD-10-CM | POA: Diagnosis not present

## 2021-09-10 DIAGNOSIS — D631 Anemia in chronic kidney disease: Secondary | ICD-10-CM | POA: Diagnosis not present

## 2021-09-10 DIAGNOSIS — N179 Acute kidney failure, unspecified: Secondary | ICD-10-CM | POA: Diagnosis not present

## 2021-09-10 DIAGNOSIS — Z8739 Personal history of other diseases of the musculoskeletal system and connective tissue: Secondary | ICD-10-CM | POA: Diagnosis not present

## 2021-09-10 DIAGNOSIS — Z79899 Other long term (current) drug therapy: Secondary | ICD-10-CM | POA: Diagnosis not present

## 2021-09-10 DIAGNOSIS — Z7901 Long term (current) use of anticoagulants: Secondary | ICD-10-CM | POA: Diagnosis not present

## 2021-09-10 DIAGNOSIS — I129 Hypertensive chronic kidney disease with stage 1 through stage 4 chronic kidney disease, or unspecified chronic kidney disease: Secondary | ICD-10-CM | POA: Diagnosis not present

## 2021-09-10 DIAGNOSIS — R55 Syncope and collapse: Secondary | ICD-10-CM | POA: Diagnosis not present

## 2021-09-10 DIAGNOSIS — I272 Pulmonary hypertension, unspecified: Secondary | ICD-10-CM | POA: Diagnosis present

## 2021-09-10 DIAGNOSIS — Z886 Allergy status to analgesic agent status: Secondary | ICD-10-CM | POA: Diagnosis not present

## 2021-09-10 DIAGNOSIS — I452 Bifascicular block: Secondary | ICD-10-CM | POA: Diagnosis not present

## 2021-09-10 DIAGNOSIS — R778 Other specified abnormalities of plasma proteins: Secondary | ICD-10-CM | POA: Diagnosis not present

## 2021-09-10 DIAGNOSIS — Z85820 Personal history of malignant melanoma of skin: Secondary | ICD-10-CM | POA: Diagnosis not present

## 2021-09-10 DIAGNOSIS — Z86711 Personal history of pulmonary embolism: Secondary | ICD-10-CM | POA: Diagnosis not present

## 2021-09-10 DIAGNOSIS — Z85828 Personal history of other malignant neoplasm of skin: Secondary | ICD-10-CM | POA: Diagnosis not present

## 2021-09-10 DIAGNOSIS — Z86718 Personal history of other venous thrombosis and embolism: Secondary | ICD-10-CM | POA: Diagnosis not present

## 2021-09-10 DIAGNOSIS — I088 Other rheumatic multiple valve diseases: Secondary | ICD-10-CM | POA: Diagnosis not present

## 2021-09-10 DIAGNOSIS — I6523 Occlusion and stenosis of bilateral carotid arteries: Secondary | ICD-10-CM | POA: Diagnosis not present

## 2021-09-10 DIAGNOSIS — Z8679 Personal history of other diseases of the circulatory system: Secondary | ICD-10-CM | POA: Diagnosis not present

## 2021-09-10 DIAGNOSIS — R4020 Unspecified coma: Secondary | ICD-10-CM | POA: Diagnosis not present

## 2021-09-10 DIAGNOSIS — Z885 Allergy status to narcotic agent status: Secondary | ICD-10-CM | POA: Diagnosis not present

## 2021-09-10 DIAGNOSIS — G9389 Other specified disorders of brain: Secondary | ICD-10-CM | POA: Diagnosis not present

## 2021-09-10 DIAGNOSIS — N183 Chronic kidney disease, stage 3 unspecified: Secondary | ICD-10-CM | POA: Diagnosis not present

## 2021-09-10 DIAGNOSIS — I4510 Unspecified right bundle-branch block: Secondary | ICD-10-CM | POA: Diagnosis not present

## 2021-09-10 DIAGNOSIS — Z28310 Unvaccinated for covid-19: Secondary | ICD-10-CM | POA: Diagnosis not present

## 2021-09-10 DIAGNOSIS — G319 Degenerative disease of nervous system, unspecified: Secondary | ICD-10-CM | POA: Diagnosis not present

## 2021-09-10 DIAGNOSIS — R531 Weakness: Secondary | ICD-10-CM | POA: Diagnosis not present

## 2021-09-10 DIAGNOSIS — R569 Unspecified convulsions: Secondary | ICD-10-CM | POA: Diagnosis not present

## 2021-09-10 DIAGNOSIS — Z8673 Personal history of transient ischemic attack (TIA), and cerebral infarction without residual deficits: Secondary | ICD-10-CM | POA: Diagnosis not present

## 2021-09-10 DIAGNOSIS — G7000 Myasthenia gravis without (acute) exacerbation: Secondary | ICD-10-CM | POA: Diagnosis not present

## 2021-09-10 DIAGNOSIS — R4182 Altered mental status, unspecified: Secondary | ICD-10-CM | POA: Diagnosis not present

## 2021-09-10 DIAGNOSIS — Z20822 Contact with and (suspected) exposure to covid-19: Secondary | ICD-10-CM | POA: Diagnosis not present

## 2021-09-10 DIAGNOSIS — N1832 Chronic kidney disease, stage 3b: Secondary | ICD-10-CM | POA: Diagnosis not present

## 2021-09-10 DIAGNOSIS — J449 Chronic obstructive pulmonary disease, unspecified: Secondary | ICD-10-CM | POA: Diagnosis present

## 2021-09-17 ENCOUNTER — Ambulatory Visit (INDEPENDENT_AMBULATORY_CARE_PROVIDER_SITE_OTHER): Payer: Medicare Other | Admitting: Neurology

## 2021-09-17 ENCOUNTER — Encounter: Payer: Self-pay | Admitting: Neurology

## 2021-09-17 VITALS — BP 128/70 | HR 67 | Ht 69.0 in | Wt 207.5 lb

## 2021-09-17 DIAGNOSIS — G7001 Myasthenia gravis with (acute) exacerbation: Secondary | ICD-10-CM | POA: Diagnosis not present

## 2021-09-17 DIAGNOSIS — R778 Other specified abnormalities of plasma proteins: Secondary | ICD-10-CM | POA: Diagnosis not present

## 2021-09-17 DIAGNOSIS — R799 Abnormal finding of blood chemistry, unspecified: Secondary | ICD-10-CM | POA: Diagnosis not present

## 2021-09-17 DIAGNOSIS — M629 Disorder of muscle, unspecified: Secondary | ICD-10-CM

## 2021-09-17 DIAGNOSIS — E538 Deficiency of other specified B group vitamins: Secondary | ICD-10-CM | POA: Diagnosis not present

## 2021-09-17 DIAGNOSIS — R7989 Other specified abnormal findings of blood chemistry: Secondary | ICD-10-CM | POA: Diagnosis not present

## 2021-09-17 DIAGNOSIS — R0602 Shortness of breath: Secondary | ICD-10-CM

## 2021-09-17 DIAGNOSIS — R569 Unspecified convulsions: Secondary | ICD-10-CM

## 2021-09-17 DIAGNOSIS — Z79899 Other long term (current) drug therapy: Secondary | ICD-10-CM | POA: Diagnosis not present

## 2021-09-17 NOTE — Progress Notes (Signed)
? ? ?ASSESSMENT AND PLAN ?88 y.o. year old male  ? ? Seropositive myasthenia gravis with exacerbation, ?Following his COVID diagnosis in April 2022,, increased difficulty with swallowing, general weakness ?Continued complaints of weakness following 6 days of Medrol pack, higher dose of Mestinon, restarted on IVIG treatment, loading dose on May 18, followed by 1 g/kg maintenance dose June 15, November 18, 2020, last infusion was in April 2023, ?He is doing very well from myasthenia gravis standpoint, ? Not a good candidate for long-term immunosuppressive treatment, due to previous history of extensive skin cancer, could not tolerate CellCept, Imuran ? Continue giving him intermittent IVIG when he has flareup of muscle weakness ? ?Shortness of breath ? Baseline intermittent shortness of breath with exertion, this has been a problem since his PE in 2003, on chronic Coumadin treatment ? He also has mild persistent bilateral eye closure, cheek puff muscle weakness due to multiple facial skin surgery, blepharoplasty ?   ? ?DIAGNOSTIC DATA (LABS, IMAGING, TESTING) ?- I reviewed patient records, labs, notes, testing and imaging myself   ? ? ?HISTORY OF PRESENT ILLNESS: ? ?Calvin Sloan  is a 43 -year-old right-handed white married male with generalized seropositive myasthenia gravis characterized by bulbar weakness, shortness of breath , and dysphagia. Patient of [name] ?  ?He was diagnosed in 10/2002 with a  positive Tensilon test, positive acetylchoine receptor antibody,and had a positive ANA at 1-160 speckled pattern. Initially he had right eye ptosis and subsequently, double vision, fatigue with chewing, dysphagia, and upper and lower extremity weakness. He was initially treated with Mestinon bromide in June 2004,  which was associated with GI side effects. ?  ?Prednisone was started in 03/29/2003 and CellCept 05/2003. He developed increased jitteriness, rash, and cramps on CellCept which was discontinued 10/2003. He had  progressive bulbar weakness and was admitted 12/2003 for IV IgG therapy which required a PEG tube for dysphagia transiently. He was started on Imuran. 01/17/2004 he was readmitted for 2 days of IV IgG because of shortness of breath. He has not been hospitalized since 01/2004.  ?  ?He has been slowly tapered off prednisone 09/2007 and was on Imuran and Mestinon bromide.  ?  ?He had B12 deficiency and was receiving B12 shots ,but  B12 levels off injections have been normal and B12 shots were discontinued. ?  ?He has  a history of vague visual loss on the left of unknown etiology. He denies swallowing problems, speech problems, voice changes, focal weakness, or double vision. ?  ?He is independent in activities of daily living and takes one nap per day.He denies memory loss but his wife states that he does have memory loss. His last blood studies in my office 05/27/10 were normal except a low white blood cell count of 3900. ?  ?His dermatologist [name] was concerned that the Imuran is associated with increased risk of skin cancer and the imuran was discontinued in 12/30/2010. His skin cancer (basal cell cancer) has improved since his imuran was stopped. ?  ?He has noted no change In myasthenic symptoms. His skin has improved. His medication for myasthenia is mesinon bromide 60  milligrams one and one half tablets 4 times a day. He has shortness of breath since his pulmonary emboli in 2003, also complains of excessive fatigue.  ?  ?He is allergic to aspirin, GI side effect, gastric ulcer,  and is on Plavix for a history of TIAs with right arm weakness He has  swallowing problems at  the beginning of the meal with liquids that improves as the meal continues.   ?  ?He had right CTS release in May 2014 by [name]. ?  ?He complains of shortness of breath with walking, bending over to tire his shoed, due to PE 2003, pulmnolgist folllowed up, he does not like to chew on tough beef, he denies swallowing difficulty, no  double vision, no droopy eyelid, no significant bleeding muscle weakness. He is active in yard, shop, threadmill   ?  ?UPDATE Jan 16th 2015: ?He is now tapering off Mestinon, there was no significant change in his functional status, he denies double vision, no ptosis, continue complains of shortness of breath with exertion no significant and muscle weakness, he has to wash down his food sometimes, but no significant swelling, or chewing difficulty, no dysarthria ?  ?UPDATE July 16th 2015: ?He is no longer mestinon, he does complains of increased breathing difficulty from prolonged walking, no chewing difficulty, no double vision, no ptosis. ?  ?Laboratory continue to demonstrate positive acetylcholine binding, and modulating antibodies.  He did reported that his skin cancer has improved after stopping imuran, could not tolerate cellcept per record.  Previously he responded to IVIG very well. ?  ?UPDATE Sept 25th 2015: ?He was stated on prednisone '10mg'$  qday, and mestinon '60mg'$  tid in June 2015, which did help his fatigue, shortness of breath, wife reported 50% improvement, he has increased appetite ?  ?But he was admitted to [facility] in Sept 17th for bilateral PEs, right leg DVT, he is started on coumadin, bridge with Lovenox, [name] is monitoring his INR ?  ?He denies double vision, no ptosis, no limb muscle weakness, no dysphagia ?  ?UPDATE April 4th 2016: ?He is on coumadin for PE, INR was monitored by [name], his myasthenia gravis is under good control, is taking prednisone 10 mg every day, he has surgery in Nov 2015 for right leg cellulitis.   ?He only does little around his house, he has no double vision, SOB due to previous PE,  ?  ?UPDATE Feb 18 2015: ?Previous laboratory evaluation showed mild B12 deficiency with level of 204, He did receive IM B12 supplement at his primary care Dr. [name] office for a while, now with normal repeat laboratory evaluation, injection has stopped ?He has mild  choking difficulty, this has been going on since August 2016, not getting worse,   he has more trouble with liquid. He complains of shortness of breath when bending over. He denies significant gait difficulty, no breathing difficulty otherwise, he denies double vision, no ptosis,, No chewing difficulties   ?  ?UPDATE May 22 2015: ?He came in last visit April 16 2015 complains of worsening blurry vision, double vision, swallowing difficulties, trouble talking, he reported 50% weakness compared to baseline, difficulty getting up from seated position, difficulty chewing, worsening shortness of breath, on examination, he was noted to have increased weakness compared to previous examination, he had moderate eye-closure cheek puff, mild neck flexion weakness, mild bilateral shoulder abduction bilateral hip flexion weakness. ?  ?He was treated with IVIG 400 mg/kg every day for 5 days from December 11 to May 02 2015, his vision has much improved, he can walk better, but he continue have swallowing difficulty, dysarthria, he is also taking prednisone 40 mg every day, increase his Mestinon 60 mg to 3-4 tablets daily ?  ?He was not able to tolerate immunosuppression treatment in the past due to skin cancer ?  ?UPDATE  Jul 07 2015: ?  ?I have called his wife, pulmonary function test in June 05 2015. ?  ?1, FVC is 3.1 5, 87%, this is normal ?2. FEV1 is 2.1 6, 77%, with a percent FEV1 of 68, this indicated mild to moderate primary small  obstruction in this patient was a history of cigarette smoking, large elderly flow rates are generally normal ?  ?3. Flow volume loop is unremarkable ?4, lung volumes indicate some evidence of air trapping with an increased residual volume, the TLC is normal, the increased residual volume confirms the presence of airflow obstruction ?  ?5, DLCO is 19.0, 90%, this is normal ?6, elderly resistant 3.7 2, 265%, this is increased ?  ?Wife reported, he recently had swallowing study, there was  no significant abnormality found, his swallowing, breathing, symptoms overall has improved ?  ?He did think that IVIG treatment in Dec 11-16th 2016 has helped his blurry vision, it is gone, he still feel

## 2021-09-18 LAB — TROPONIN T: Troponin T (Highly Sensitive): 30 ng/L (ref 0–22)

## 2021-09-18 LAB — CK: Total CK: 209 U/L — ABNORMAL HIGH (ref 30–208)

## 2021-09-18 LAB — TSH: TSH: 1.98 u[IU]/mL (ref 0.450–4.500)

## 2021-09-18 LAB — VITAMIN B12: Vitamin B-12: 259 pg/mL (ref 232–1245)

## 2021-09-21 ENCOUNTER — Telehealth: Payer: Self-pay | Admitting: Neurology

## 2021-09-21 NOTE — Telephone Encounter (Signed)
Medicare/[employer] no auth req oreder sent to [employer]. They will reach out to the patient to schedule.  ?

## 2021-09-21 NOTE — Telephone Encounter (Signed)
error 

## 2021-09-21 NOTE — Telephone Encounter (Signed)
I spoke with the patient's wife (as per DPR). Informed her of results. She verbalized understanding and expressed appreciation for the call. All questions answered. ?

## 2021-09-21 NOTE — Telephone Encounter (Signed)
Please call patient, troponin level has significantly decreased compared to 11 days ago, continue follow-up with his primary care physician ? ?Low normal B12 level, he would benefit over-the-counter B12 supplement, 1000 mcg daily ? ? ?Rest of the laboratory evaluation showed no significant abnormalities, ?

## 2021-09-22 ENCOUNTER — Other Ambulatory Visit: Payer: Self-pay | Admitting: Neurology

## 2021-09-22 DIAGNOSIS — Z86711 Personal history of pulmonary embolism: Secondary | ICD-10-CM

## 2021-09-24 DIAGNOSIS — R55 Syncope and collapse: Secondary | ICD-10-CM | POA: Diagnosis not present

## 2021-09-24 DIAGNOSIS — I4891 Unspecified atrial fibrillation: Secondary | ICD-10-CM | POA: Diagnosis not present

## 2021-09-29 ENCOUNTER — Ambulatory Visit (INDEPENDENT_AMBULATORY_CARE_PROVIDER_SITE_OTHER): Payer: Medicare Other | Admitting: Neurology

## 2021-09-29 DIAGNOSIS — G7001 Myasthenia gravis with (acute) exacerbation: Secondary | ICD-10-CM

## 2021-09-29 DIAGNOSIS — R0602 Shortness of breath: Secondary | ICD-10-CM

## 2021-09-29 DIAGNOSIS — R569 Unspecified convulsions: Secondary | ICD-10-CM

## 2021-10-01 DIAGNOSIS — I10 Essential (primary) hypertension: Secondary | ICD-10-CM | POA: Diagnosis not present

## 2021-10-01 DIAGNOSIS — I4891 Unspecified atrial fibrillation: Secondary | ICD-10-CM | POA: Diagnosis not present

## 2021-10-01 DIAGNOSIS — Z13220 Encounter for screening for lipoid disorders: Secondary | ICD-10-CM | POA: Diagnosis not present

## 2021-10-01 DIAGNOSIS — Z1329 Encounter for screening for other suspected endocrine disorder: Secondary | ICD-10-CM | POA: Diagnosis not present

## 2021-10-01 DIAGNOSIS — R7303 Prediabetes: Secondary | ICD-10-CM | POA: Diagnosis not present

## 2021-10-01 DIAGNOSIS — E559 Vitamin D deficiency, unspecified: Secondary | ICD-10-CM | POA: Diagnosis not present

## 2021-10-01 DIAGNOSIS — N189 Chronic kidney disease, unspecified: Secondary | ICD-10-CM | POA: Diagnosis not present

## 2021-10-05 DIAGNOSIS — D485 Neoplasm of uncertain behavior of skin: Secondary | ICD-10-CM | POA: Diagnosis not present

## 2021-10-05 DIAGNOSIS — Z4801 Encounter for change or removal of surgical wound dressing: Secondary | ICD-10-CM | POA: Diagnosis not present

## 2021-10-05 DIAGNOSIS — C44329 Squamous cell carcinoma of skin of other parts of face: Secondary | ICD-10-CM | POA: Diagnosis not present

## 2021-10-05 DIAGNOSIS — Z48817 Encounter for surgical aftercare following surgery on the skin and subcutaneous tissue: Secondary | ICD-10-CM | POA: Diagnosis not present

## 2021-10-06 NOTE — Procedures (Signed)
   HISTORY: 88 year old male presenting with seizure-like activity  TECHNIQUE:  This is a routine 16 channel EEG recording with one channel devoted to a limited EKG recording.  It was performed during wakefulness, drowsiness and asleep.  Hyperventilation and photic stimulation were performed as activating procedures.  There are minimum muscle and movement artifact noted.  Upon maximum arousal, posterior dominant waking rhythm consistent of rhythmic low amplitude alpha range activity. Activities are symmetric over the bilateral posterior derivations and attenuated with eye opening.  Hyperventilation produced mild/moderate buildup with higher amplitude and the slower activities noted.  Photic stimulation did not alter the tracing.  During EEG recording, patient developed drowsiness and no deeper stage of sleep was achieved  During EEG recording, there was no epileptiform discharge noted.  EKG demonstrate sinus rhythm, with heart rate of 52 bpm  CONCLUSION: This is a  normal awake EEG.  There is no electrodiagnostic evidence of epileptiform discharge.  Marcial Pacas, M.D. Ph.D.  [facility] [place], [place] [address] Phone: [phone] Fax:      [phone]

## 2021-10-07 ENCOUNTER — Telehealth: Payer: Self-pay | Admitting: *Deleted

## 2021-10-07 DIAGNOSIS — Z6831 Body mass index (BMI) 31.0-31.9, adult: Secondary | ICD-10-CM | POA: Diagnosis not present

## 2021-10-07 DIAGNOSIS — E559 Vitamin D deficiency, unspecified: Secondary | ICD-10-CM | POA: Diagnosis not present

## 2021-10-07 DIAGNOSIS — I4891 Unspecified atrial fibrillation: Secondary | ICD-10-CM | POA: Diagnosis not present

## 2021-10-07 DIAGNOSIS — I10 Essential (primary) hypertension: Secondary | ICD-10-CM | POA: Diagnosis not present

## 2021-10-07 DIAGNOSIS — Z86711 Personal history of pulmonary embolism: Secondary | ICD-10-CM | POA: Diagnosis not present

## 2021-10-07 DIAGNOSIS — G7000 Myasthenia gravis without (acute) exacerbation: Secondary | ICD-10-CM | POA: Diagnosis not present

## 2021-10-07 DIAGNOSIS — R55 Syncope and collapse: Secondary | ICD-10-CM | POA: Diagnosis not present

## 2021-10-07 DIAGNOSIS — R7303 Prediabetes: Secondary | ICD-10-CM | POA: Diagnosis not present

## 2021-10-07 NOTE — Telephone Encounter (Signed)
-----   Message from Marcial Pacas, MD sent at 10/07/2021 11:24 AM EDT ----- Please call patient,  EEG was normal.

## 2021-10-07 NOTE — Telephone Encounter (Signed)
Left VM for normal results (as per DPR).

## 2021-10-11 DIAGNOSIS — R55 Syncope and collapse: Secondary | ICD-10-CM | POA: Diagnosis not present

## 2021-10-14 ENCOUNTER — Ambulatory Visit (HOSPITAL_COMMUNITY)
Admission: RE | Admit: 2021-10-14 | Discharge: 2021-10-14 | Disposition: A | Discharge status: Home / Self Care | Payer: Medicare Other | Source: Ambulatory Visit | Attending: Neurology | Admitting: Neurology

## 2021-10-14 DIAGNOSIS — R0602 Shortness of breath: Secondary | ICD-10-CM | POA: Insufficient documentation

## 2021-10-14 DIAGNOSIS — R569 Unspecified convulsions: Secondary | ICD-10-CM | POA: Insufficient documentation

## 2021-10-14 DIAGNOSIS — G7001 Myasthenia gravis with (acute) exacerbation: Secondary | ICD-10-CM | POA: Insufficient documentation

## 2021-10-14 DIAGNOSIS — Z135 Encounter for screening for eye and ear disorders: Secondary | ICD-10-CM | POA: Diagnosis not present

## 2021-10-14 DIAGNOSIS — Z86711 Personal history of pulmonary embolism: Secondary | ICD-10-CM | POA: Insufficient documentation

## 2021-10-15 ENCOUNTER — Telehealth: Payer: Self-pay | Admitting: Neurology

## 2021-10-15 NOTE — Telephone Encounter (Signed)
Please call patient, MRI of the brain showed no acute abnormality, evidence of chronic left cerebellar infarction, moderate supratentorium small vessel disease  If he does not have known history of stroke will complete stroke evaluation including echocardiogram, ultrasound of carotid artery.   IMPRESSION: 1. No acute or subacute insult.  No reversible finding. 2. Scattered chronic cerebral cortex infarcts. Moderate chronic left cerebellar infarct.

## 2021-10-15 NOTE — Telephone Encounter (Signed)
I spoke with the patient's wife, [name] (as per DPR) to inform her of MRI results. She stated on June 12th Hodge has an appointment with his cardiologist Dr. Oswaldo Milian. She would like for him to see Dr. Gardiner Rhyme and review results with him before scheduling an echocardiogram and ultrasound of carotid artery.

## 2021-10-21 ENCOUNTER — Ambulatory Visit: Payer: Medicare Other | Admitting: Neurology

## 2021-10-25 NOTE — Progress Notes (Unsigned)
Cardiology Office Note:    Date:  10/27/2021   ID:  Calvin Sloan, DOB Feb 11, 1934, MRN [id number]  PCP:  [facility]  Cardiologist:  None  Electrophysiologist:  None   Referring MD: [facility]*   Chief Complaint  Patient presents with   Loss of Consciousness    History of Present Illness:    Calvin Sloan is a 88 y.o. male with a hx of myasthenia gravis, COPD, hypertension, DVT/PE who is referred for evaluation of syncope.  He was admitted at [facility] in April 2023 with syncope.  Head CT showed no acute intracranial findings.  Echocardiogram 09/11/2021 showed EF 60 to 65%, mild MR, mild AI, normal RV function, moderate TR, moderate PAH, small PFO.  Carotid duplex showed minor atherosclerosis but no significant stenosis.  He reports syncopal episode occurred after he was working on his lawnmower and started to feel hot.  He sat down and wife was changing a dressing on his ear, as he had recent skin cancer removal.  He reports he was feeling better as he was sitting but then had sudden loss of consciousness.  Wife reports he was unconscious for about 30 seconds.  He denied any chest pain, or palpitations.  No episodes of lightheadedness or syncope since that time.  He has chronic dyspnea which he attributes to his COPD and myasthenia.  He does report 1 prior episode of syncope that occurred 5 years ago while working in the heat.  No bleeding issues on warfarin.  He smoked about 15 years, quit in 1970.  No family history of heart disease.       Past Medical History:  Diagnosis Date   B12 deficiency    COPD (chronic obstructive pulmonary disease) ([place])    Essential tremor    Hypertension    Memory deficit    Myasthenia gravis ([place])    since 2004   PONV (postoperative nausea and vomiting)    Pulmonary emboli ([place])    in Nov 2003   Skin cancer     Past Surgical History:  Procedure Laterality Date   CARPAL TUNNEL RELEASE Right 09/14/2012   Procedure:  CARPAL TUNNEL RELEASE;  Surgeon: Cammie Sickle., MD;  Location: [facility];  Service: Orthopedics;  Laterality: Right;   CARPAL TUNNEL RELEASE Left 11/04/2015   Procedure: LEFT CARPAL TUNNEL RELEASE, EXTENDED INCISION;  Surgeon: Daryll Brod, MD;  Location: [facility];  Service: Orthopedics;  Laterality: Left;  ANESTHESIA: IV REGIONAL UPPER ARM   CATARACT EXTRACTION Right    CATARACT EXTRACTION W/PHACO Left 03/12/2021   Procedure: CATARACT EXTRACTION PHACO AND INTRAOCULAR LENS PLACEMENT (IOC);  Surgeon: Baruch Goldmann, MD;  Location: [facility];  Service: Ophthalmology;  Laterality: Left;  CDE-42.07   CHOLECYSTECTOMY  2012   [place]   COLONOSCOPY     FOOT SURGERY  1966   lt orif foot   SKIN CANCER EXCISION      Current Medications: Current Meds  Medication Sig   fluorouracil (EFUDEX) 5 % cream Apply topically.   ketoconazole (NIZORAL) 2 % shampoo ketoconazole 2 % shampoo   metroNIDAZOLE (METROGEL) 1 % gel Apply 1 application topically daily.   pyridostigmine (MESTINON) 60 MG tablet TAKE 1 TABLET (60 MG TOTAL) BY MOUTH IN THE MORNING, AT NOON, IN THE EVENING, AND AT BEDTIME.   trimethoprim-polymyxin b (POLYTRIM) ophthalmic solution polymyxin B sulfate 10,000 unit-trimethoprim 1 mg/mL eye drops   valsartan-hydrochlorothiazide (DIOVAN-HCT) 320-25 MG per tablet Take 0.5 tablets  by mouth daily.   warfarin (COUMADIN) 5 MG tablet Take 5-7.5 mg by mouth. Take 7.$RemoveBefor'5mg'[id number]$  by mouth on Tuesday and Thursday and take 5 mg on Sunday, Monday, Wednesday, Friday and Saturday.     Allergies:   Aspirin, Shellfish allergy, and Tramadol   Social History   Socioeconomic History   Marital status: Married    Spouse name: Calvin Sloan   Number of children: 2   Years of education: 12   Highest education level: Not on file  Occupational History   Occupation: Plant    Comment: Retired  Tobacco Use   Smoking status: Former    Packs/day: 0.25    Years: 10.00    Total pack years: 2.50     Types: Cigarettes    Quit date: 05/17/1968    Years since quitting: 53.4   Smokeless tobacco: Never  Vaping Use   Vaping Use: Never used  Substance and Sexual Activity   Alcohol use: No   Drug use: No   Sexual activity: Yes  Other Topics Concern   Not on file  Social History Narrative   He lives with his wife Calvin Sloan(, has 2 children, retired.    Hard of hearing.   Right handed.   Caffeine- None   Social Determinants of Health   Financial Resource Strain: Not on file  Food Insecurity: Not on file  Transportation Needs: Not on file  Physical Activity: Not on file  Stress: Not on file  Social Connections: Not on file     Family History: The patient's family history includes Heart disease in his father and mother.  ROS:   Please see the history of present illness.     All other systems reviewed and are negative.  EKGs/Labs/Other Studies Reviewed:    The following studies were reviewed today:   EKG:   10/26/21: Normal sinus rhythm, rate 61, right bundle branch block, left anterior fascicular block, LVH  Recent Labs: 09/17/2021: TSH 1.980  Recent Lipid Panel No results found for: "CHOL", "TRIG", "HDL", "CHOLHDL", "VLDL", "LDLCALC", "LDLDIRECT"  Physical Exam:    VS:  BP 108/64   Pulse 64   Ht $R'5\' 9"'iC$  (1.753 m)   Wt 211 lb 12.8 oz (96.1 kg)   SpO2 96%   BMI 31.28 kg/m     Wt Readings from Last 3 Encounters:  10/26/21 211 lb 12.8 oz (96.1 kg)  09/17/21 207 lb 8 oz (94.1 kg)  04/21/21 206 lb (93.4 kg)     GEN:   in no acute distress HEENT: Normal NECK: No JVD; No carotid bruits LYMPHATICS: No lymphadenopathy CARDIAC: RRR, no murmurs, rubs, gallops RESPIRATORY:  Clear to auscultation without rales, wheezing or rhonchi  ABDOMEN: Soft, non-tender, non-distended MUSCULOSKELETAL:  No edema; No deformity  SKIN: Warm and dry NEUROLOGIC:  Alert and oriented x 3 PSYCHIATRIC:  Normal affect   ASSESSMENT:    1. Syncope and collapse   2. Pulmonary hypertension,  unspecified ([place])   3. Essential hypertension   4. Pulmonary embolism without acute cor pulmonale, unspecified chronicity, unspecified pulmonary embolism type [facility])    PLAN:    Syncope:  admitted at [facility] in April 2023 with syncope.  Head CT showed no acute intracranial findings.  Echocardiogram 09/11/2021 showed EF 60 to 65%, mild MR, mild AI, normal RV function, moderate TR, moderate PAH, small PFO.  Carotid duplex showed minor atherosclerosis but no significant stenosis. -Zio patch x2 weeks  Pulmonary hypertension: Reportedly moderate PAH on echocardiogram 08/2021 at [facility].  Will monitor, plan repeat echocardiogram at next clinic visit  Hypertension: On valsartan-HCTZ 160-12.5 mg daily.  Appears controlled  DVT/PE: on warfarin  Myasthenia gravis: On Mestinon   RTC in 3 months   Medication Adjustments/Labs and Tests Ordered: Current medicines are reviewed at length with the patient today.  Concerns regarding medicines are outlined above.  Orders Placed This Encounter  Procedures   LONG TERM MONITOR-LIVE TELEMETRY (3-14 DAYS)   EKG 12-Lead   No orders of the defined types were placed in this encounter.   Patient Instructions  Medication Instructions:  Your physician recommends that you continue on your current medications as directed. Please refer to the Current Medication list given to you today.  *If you need a refill on your cardiac medications before your next appointment, please call your pharmacy*  Testing/Procedures: ZIO AT Long term monitor-Live Telemetry  Your physician has requested you wear a ZIO patch monitor for 14 days.  This is a single patch monitor. [employer] supplies one patch monitor per enrollment. Additional  stickers are not available.  Please do not apply patch if you will be having a Nuclear Stress Test, Echocardiogram, Cardiac CT, MRI,  or Chest Xray during the period you would be wearing the monitor. The patch cannot be worn during   these tests. You cannot remove and re-apply the ZIO AT patch monitor.  Your ZIO patch monitor will be mailed 3 day USPS to your address on file. It may take 3-5 days to  receive your monitor after you have been enrolled.  Once you have received your monitor, please review the enclosed instructions. Your monitor has  already been registered assigning a specific monitor serial # to you.   Billing and Patient Assistance Program information  Theodore Demark has been supplied with any insurance information on record for billing. [employer] offers a sliding scale Patient Assistance Program for patients without insurance, or whose  insurance does not completely cover the cost of the ZIO patch monitor. You must apply for the  Patient Assistance Program to qualify for the discounted rate. To apply, call [employer] at [phone],  select option 4, select option 2 , ask to apply for the Patient Assistance Program, (you can request an  interpreter if needed). [employer] will ask your household income and how many people are in your  household. [employer] will quote your out-of-pocket cost based on this information. They will also be able  to set up a 12 month interest free payment plan if needed.  Applying the monitor   Shave hair from upper left chest.  Hold the abrader disc by orange tab. Rub the abrader in 40 strokes over left upper chest as indicated in  your monitor instructions.  Clean area with 4 enclosed alcohol pads. Use all pads to ensure the area is cleaned thoroughly. Let  dry.  Apply patch as indicated in monitor instructions. Patch will be placed under collarbone on left side of  chest with arrow pointing upward.  Rub patch adhesive wings for 2 minutes. Remove the white label marked "1". Remove the white label  marked "2". Rub patch adhesive wings for 2 additional minutes.  While looking in a mirror, press and release button in center of patch. A small green light will flash 3-4  times. This will be  your only indicator that the monitor has been turned on.  Do not shower for the first 24 hours. You may shower after the first 24 hours.  Press the button if you feel a  symptom. You will hear a small click. Record Date, Time and Symptom in  the Patient Log.   Starting the Gateway  In your kit there is a Hydrographic surveyor box the size of a cellphone. This is Airline pilot. It transmits all your  recorded data to [facility]. This box must always stay within 10 feet of you. Open the box and push the *  button. There will be a light that blinks orange and then green a few times. When the light stops  blinking, the Gateway is connected to the ZIO patch. Call [employer] at [phone] to confirm your monitor is transmitting.  Returning your monitor  Remove your patch and place it inside the [place]. In the lower half of the Gateway there is a white  bag with prepaid postage on it. Place Gateway in bag and seal. Mail package back to [place] as soon as  possible. Your physician should have your final report approximately 7 days after you have mailed back  your monitor. Call [place] at [phone] if you have questions regarding your ZIO AT  patch monitor. Call them immediately if you see an orange light blinking on your monitor.  If your monitor falls off in less than 4 days, contact our Monitor department at [phone]. If your  monitor becomes loose or falls off after 4 days call [employer] at [phone] for suggestions on  securing your monitor   Follow-Up: At [facility], you and your health needs are our priority.  As part of our continuing mission to provide you with exceptional heart care, we have created designated Provider Care Teams.  These Care Teams include your primary Cardiologist (physician) and Advanced Practice Providers (APPs -  Physician Assistants and Nurse Practitioners) who all work together to provide you with the care you need, when you need  it.  We recommend signing up for the patient portal called "MyChart".  Sign up information is provided on this After Visit Summary.  MyChart is used to connect with patients for Virtual Visits (Telemedicine).  Patients are able to view lab/test results, encounter notes, upcoming appointments, etc.  Non-urgent messages can be sent to your provider as well.   To learn more about what you can do with MyChart, go to [email or web address].    Your next appointment:   3 month(s)  The format for your next appointment:   In Person  Provider:   Dr. Gardiner Rhyme       Signed, Donato Heinz, MD  10/27/2021 6:41 AM    [place]

## 2021-10-26 ENCOUNTER — Ambulatory Visit (INDEPENDENT_AMBULATORY_CARE_PROVIDER_SITE_OTHER): Payer: Medicare Other

## 2021-10-26 ENCOUNTER — Encounter: Payer: Self-pay | Admitting: Cardiology

## 2021-10-26 ENCOUNTER — Ambulatory Visit (INDEPENDENT_AMBULATORY_CARE_PROVIDER_SITE_OTHER): Payer: Medicare Other | Admitting: Cardiology

## 2021-10-26 VITALS — BP 108/64 | HR 64 | Ht 69.0 in | Wt 211.8 lb

## 2021-10-26 DIAGNOSIS — I10 Essential (primary) hypertension: Secondary | ICD-10-CM

## 2021-10-26 DIAGNOSIS — I272 Pulmonary hypertension, unspecified: Secondary | ICD-10-CM

## 2021-10-26 DIAGNOSIS — R55 Syncope and collapse: Secondary | ICD-10-CM

## 2021-10-26 DIAGNOSIS — I2699 Other pulmonary embolism without acute cor pulmonale: Secondary | ICD-10-CM | POA: Diagnosis not present

## 2021-10-26 NOTE — Progress Notes (Unsigned)
Enrolled for [employer] to mail a ZIO AT Live Telemetry monitor to patients address on file.  

## 2021-10-26 NOTE — Patient Instructions (Addendum)
Medication Instructions:  Your physician recommends that you continue on your current medications as directed. Please refer to the Current Medication list given to you today.  *If you need a refill on your cardiac medications before your next appointment, please call your pharmacy*  Testing/Procedures: ZIO AT Long term monitor-Live Telemetry  Your physician has requested you wear a ZIO patch monitor for 14 days.  This is a single patch monitor. [employer] supplies one patch monitor per enrollment. Additional  stickers are not available.  Please do not apply patch if you will be having a Nuclear Stress Test, Echocardiogram, Cardiac CT, MRI,  or Chest Xray during the period you would be wearing the monitor. The patch cannot be worn during  these tests. You cannot remove and re-apply the ZIO AT patch monitor.  Your ZIO patch monitor will be mailed 3 day USPS to your address on file. It may take 3-5 days to  receive your monitor after you have been enrolled.  Once you have received your monitor, please review the enclosed instructions. Your monitor has  already been registered assigning a specific monitor serial # to you.   Billing and Patient Assistance Program information  Theodore Demark has been supplied with any insurance information on record for billing. [employer] offers a sliding scale Patient Assistance Program for patients without insurance, or whose  insurance does not completely cover the cost of the ZIO patch monitor. You must apply for the  Patient Assistance Program to qualify for the discounted rate. To apply, call [employer] at [phone],  select option 4, select option 2 , ask to apply for the Patient Assistance Program, (you can request an  interpreter if needed). [employer] will ask your household income and how many people are in your  household. [employer] will quote your out-of-pocket cost based on this information. They will also be able  to set up a 12 month interest free payment plan  if needed.  Applying the monitor   Shave hair from upper left chest.  Hold the abrader disc by orange tab. Rub the abrader in 40 strokes over left upper chest as indicated in  your monitor instructions.  Clean area with 4 enclosed alcohol pads. Use all pads to ensure the area is cleaned thoroughly. Let  dry.  Apply patch as indicated in monitor instructions. Patch will be placed under collarbone on left side of  chest with arrow pointing upward.  Rub patch adhesive wings for 2 minutes. Remove the white label marked "1". Remove the white label  marked "2". Rub patch adhesive wings for 2 additional minutes.  While looking in a mirror, press and release button in center of patch. A small green light will flash 3-4  times. This will be your only indicator that the monitor has been turned on.  Do not shower for the first 24 hours. You may shower after the first 24 hours.  Press the button if you feel a symptom. You will hear a small click. Record Date, Time and Symptom in  the Patient Log.   Starting the Gateway  In your kit there is a Hydrographic surveyor box the size of a cellphone. This is Airline pilot. It transmits all your  recorded data to [facility]. This box must always stay within 10 feet of you. Open the box and push the *  button. There will be a light that blinks orange and then green a few times. When the light stops  blinking, the Gateway is connected to the ZIO  patch. Call [employer] at [phone] to confirm your monitor is transmitting.  Returning your monitor  Remove your patch and place it inside the [place]. In the lower half of the Gateway there is a white  bag with prepaid postage on it. Place Gateway in bag and seal. Mail package back to [place] as soon as  possible. Your physician should have your final report approximately 7 days after you have mailed back  your monitor. Call [place] at [phone] if you have questions regarding your ZIO AT   patch monitor. Call them immediately if you see an orange light blinking on your monitor.  If your monitor falls off in less than 4 days, contact our Monitor department at [phone]. If your  monitor becomes loose or falls off after 4 days call [employer] at [phone] for suggestions on  securing your monitor   Follow-Up: At [facility], you and your health needs are our priority.  As part of our continuing mission to provide you with exceptional heart care, we have created designated Provider Care Teams.  These Care Teams include your primary Cardiologist (physician) and Advanced Practice Providers (APPs -  Physician Assistants and Nurse Practitioners) who all work together to provide you with the care you need, when you need it.  We recommend signing up for the patient portal called "MyChart".  Sign up information is provided on this After Visit Summary.  MyChart is used to connect with patients for Virtual Visits (Telemedicine).  Patients are able to view lab/test results, encounter notes, upcoming appointments, etc.  Non-urgent messages can be sent to your provider as well.   To learn more about what you can do with MyChart, go to [email or web address].    Your next appointment:   3 month(s)  The format for your next appointment:   In Person  Provider:   Dr. Gardiner Rhyme

## 2021-10-29 DIAGNOSIS — M1732 Unilateral post-traumatic osteoarthritis, left knee: Secondary | ICD-10-CM | POA: Diagnosis not present

## 2021-10-29 DIAGNOSIS — X58XXXD Exposure to other specified factors, subsequent encounter: Secondary | ICD-10-CM | POA: Diagnosis not present

## 2021-10-29 DIAGNOSIS — S8992XD Unspecified injury of left lower leg, subsequent encounter: Secondary | ICD-10-CM | POA: Diagnosis not present

## 2021-10-31 DIAGNOSIS — R55 Syncope and collapse: Secondary | ICD-10-CM

## 2021-11-01 DIAGNOSIS — R55 Syncope and collapse: Secondary | ICD-10-CM | POA: Diagnosis not present

## 2021-11-03 DIAGNOSIS — I4891 Unspecified atrial fibrillation: Secondary | ICD-10-CM | POA: Diagnosis not present

## 2021-11-03 DIAGNOSIS — I2699 Other pulmonary embolism without acute cor pulmonale: Secondary | ICD-10-CM | POA: Diagnosis not present

## 2021-11-06 DIAGNOSIS — I4891 Unspecified atrial fibrillation: Secondary | ICD-10-CM | POA: Diagnosis not present

## 2021-11-13 DIAGNOSIS — I2699 Other pulmonary embolism without acute cor pulmonale: Secondary | ICD-10-CM | POA: Diagnosis not present

## 2021-11-13 DIAGNOSIS — I4891 Unspecified atrial fibrillation: Secondary | ICD-10-CM | POA: Diagnosis not present

## 2021-11-20 ENCOUNTER — Other Ambulatory Visit: Payer: Self-pay

## 2021-11-20 DIAGNOSIS — I471 Supraventricular tachycardia: Secondary | ICD-10-CM

## 2021-11-25 DIAGNOSIS — E559 Vitamin D deficiency, unspecified: Secondary | ICD-10-CM | POA: Diagnosis not present

## 2021-11-25 DIAGNOSIS — Z86711 Personal history of pulmonary embolism: Secondary | ICD-10-CM | POA: Diagnosis not present

## 2021-11-25 DIAGNOSIS — R7303 Prediabetes: Secondary | ICD-10-CM | POA: Diagnosis not present

## 2021-11-25 DIAGNOSIS — R030 Elevated blood-pressure reading, without diagnosis of hypertension: Secondary | ICD-10-CM | POA: Diagnosis not present

## 2021-11-25 DIAGNOSIS — G7000 Myasthenia gravis without (acute) exacerbation: Secondary | ICD-10-CM | POA: Diagnosis not present

## 2021-11-25 DIAGNOSIS — R55 Syncope and collapse: Secondary | ICD-10-CM | POA: Diagnosis not present

## 2021-11-25 DIAGNOSIS — Z6831 Body mass index (BMI) 31.0-31.9, adult: Secondary | ICD-10-CM | POA: Diagnosis not present

## 2021-11-26 DIAGNOSIS — M25562 Pain in left knee: Secondary | ICD-10-CM | POA: Diagnosis not present

## 2021-11-26 DIAGNOSIS — S8992XD Unspecified injury of left lower leg, subsequent encounter: Secondary | ICD-10-CM | POA: Diagnosis not present

## 2021-11-26 DIAGNOSIS — M25462 Effusion, left knee: Secondary | ICD-10-CM | POA: Diagnosis not present

## 2021-11-26 DIAGNOSIS — M1732 Unilateral post-traumatic osteoarthritis, left knee: Secondary | ICD-10-CM | POA: Diagnosis not present

## 2021-12-02 DIAGNOSIS — D485 Neoplasm of uncertain behavior of skin: Secondary | ICD-10-CM | POA: Diagnosis not present

## 2021-12-02 DIAGNOSIS — L718 Other rosacea: Secondary | ICD-10-CM | POA: Diagnosis not present

## 2021-12-02 DIAGNOSIS — C44329 Squamous cell carcinoma of skin of other parts of face: Secondary | ICD-10-CM | POA: Diagnosis not present

## 2021-12-09 ENCOUNTER — Ambulatory Visit (HOSPITAL_COMMUNITY): Payer: PRIVATE HEALTH INSURANCE

## 2021-12-14 DIAGNOSIS — I2699 Other pulmonary embolism without acute cor pulmonale: Secondary | ICD-10-CM | POA: Diagnosis not present

## 2021-12-14 DIAGNOSIS — I4891 Unspecified atrial fibrillation: Secondary | ICD-10-CM | POA: Diagnosis not present

## 2021-12-28 DIAGNOSIS — I4891 Unspecified atrial fibrillation: Secondary | ICD-10-CM | POA: Diagnosis not present

## 2021-12-28 DIAGNOSIS — Z86711 Personal history of pulmonary embolism: Secondary | ICD-10-CM | POA: Diagnosis not present

## 2022-01-07 ENCOUNTER — Telehealth: Payer: Self-pay | Admitting: Neurology

## 2022-01-07 NOTE — Telephone Encounter (Signed)
Restart IVIG 2g/kg loading, divided into 4 days, 50g daily x4 days  WEIGHT =95kg,   followed by 1g/kg every 3 weeks. 50g x2 days  Please also let his wife know, if he has worsening swallowing difficulty aspiration risk, may consider hospital/emergency room evaluation,  I will need him at office IVIG infusion

## 2022-01-07 NOTE — Telephone Encounter (Signed)
I called pt's wife back and relayed plan. She is in agreement and will continue to monitor symptoms. If sx worsen and aspiration is a concern, pt will seek care at the ER.

## 2022-01-07 NOTE — Telephone Encounter (Signed)
Pt's wife, Jahziel Sinn (on [place]) he started having issues with Myasthenia gravis; swallowing on 01/04/22,Monday and has gotten worse each day. Would like a call from to discuss if nurse has a work in appt.

## 2022-01-07 NOTE — Telephone Encounter (Signed)
I called pt's wife back.   She reports since 01/04/2022 pt has been dealing with cold like sx and has had increased swallowing difficulties. She feels like the cold is playing into these sx greatly, sinus drainage is frequent.   She reports he has been eating mostly pureed foods but is still getting choked easily. She also reports taking his medications that are in pill form has been difficult this week. She has noticed increased muscle weakness from his baseline but feels like this is attributed to lack of food.   Will forward to Dr. [name] for review.   Last visit 09/17/2021 with the following as the assessment and plan:  Following his COVID diagnosis in April 2022,, increased difficulty with swallowing, general weakness Continued complaints of weakness following 6 days of Medrol pack, higher dose of Mestinon, restarted on IVIG treatment, loading dose on May 18, followed by 1 g/kg maintenance dose June 15, November 18, 2020, last infusion was in April 2023, He is doing very well from myasthenia gravis standpoint,  Not a good candidate for long-term immunosuppressive treatment, due to previous history of extensive skin cancer, could not tolerate CellCept, Imuran Continue giving him intermittent IVIG when he has flareup of muscle weakness

## 2022-01-23 ENCOUNTER — Other Ambulatory Visit: Payer: Self-pay | Admitting: Neurology

## 2022-01-25 NOTE — Telephone Encounter (Signed)
Rx filled to avoid disruption of care.

## 2022-01-26 NOTE — Progress Notes (Unsigned)
Cardiology Office Note:    Date:  01/27/2022   ID:  Calvin Sloan, DOB February 01, 1934, MRN [id number]  PCP:  [facility]  Cardiologist:  None  Electrophysiologist:  None   Referring MD: [facility]*   Chief Complaint  Patient presents with   Loss of Consciousness    History of Present Illness:    Calvin Sloan is a 88 y.o. male with a hx of myasthenia gravis, COPD, hypertension, DVT/PE who presents for follow-up.  He was referred for evaluation of syncope, initially seen 10/26/2021.  He was admitted at [facility] in April 2023 with syncope.  Head CT showed no acute intracranial findings.  Echocardiogram 09/11/2021 showed EF 60 to 65%, mild MR, mild AI, normal RV function, moderate TR, moderate PAH, small PFO.  Carotid duplex showed minor atherosclerosis but no significant stenosis.  He reports syncopal episode occurred after he was working on his lawnmower and started to feel hot.  He sat down and wife was changing a dressing on his ear, as he had recent skin cancer removal.  He reports he was feeling better as he was sitting but then had sudden loss of consciousness.  Wife reports he was unconscious for about 30 seconds.  He denied any chest pain, or palpitations.  No episodes of lightheadedness or syncope since that time.  He has chronic dyspnea which he attributes to his COPD and myasthenia.  He does report 1 prior episode of syncope that occurred 5 years ago while working in the heat.  No bleeding issues on warfarin.  He smoked about 15 years, quit in 1970.  No family history of heart disease.    Zio patch x14 days on 11/19/2021 showed 1 episode of NSVT lasting 5 beats, 14 episodes of SVT with longest lasting 11 beats.  Since last clinic visit, he reports he is doing well.  Denies any chest pain,  lightheadedness, syncope, lower extremity edema, or palpitations.  Reports chronic dyspnea, attributes to COPD and myasthenia.  No bleeding issues on warfarin.       Past Medical History:  Diagnosis Date   B12 deficiency    COPD (chronic obstructive pulmonary disease) ([place])    Essential tremor    Hypertension    Memory deficit    Myasthenia gravis ([place])    since 2004   PONV (postoperative nausea and vomiting)    Pulmonary emboli ([place])    in Nov 2003   Skin cancer     Past Surgical History:  Procedure Laterality Date   CARPAL TUNNEL RELEASE Right 09/14/2012   Procedure: CARPAL TUNNEL RELEASE;  Surgeon: Cammie Sickle., MD;  Location: [facility];  Service: Orthopedics;  Laterality: Right;   CARPAL TUNNEL RELEASE Left 11/04/2015   Procedure: LEFT CARPAL TUNNEL RELEASE, EXTENDED INCISION;  Surgeon: Daryll Brod, MD;  Location: [facility];  Service: Orthopedics;  Laterality: Left;  ANESTHESIA: IV REGIONAL UPPER ARM   CATARACT EXTRACTION Right    CATARACT EXTRACTION W/PHACO Left 03/12/2021   Procedure: CATARACT EXTRACTION PHACO AND INTRAOCULAR LENS PLACEMENT (IOC);  Surgeon: Baruch Goldmann, MD;  Location: [facility];  Service: Ophthalmology;  Laterality: Left;  CDE-42.07   CHOLECYSTECTOMY  2012   [place]   COLONOSCOPY     FOOT SURGERY  1966   lt orif foot   SKIN CANCER EXCISION      Current Medications: Current Meds  Medication Sig   fluorouracil (EFUDEX) 5 % cream Apply topically.   ketoconazole (NIZORAL)  2 % shampoo ketoconazole 2 % shampoo   metroNIDAZOLE (METROGEL) 1 % gel Apply 1 application topically daily.   pyridostigmine (MESTINON) 60 MG tablet TAKE 1 TABLET IN THE MORNING  , AT NOON  , IN THE EVENING  AND AT BEDTIME   trimethoprim-polymyxin b (POLYTRIM) ophthalmic solution polymyxin B sulfate 10,000 unit-trimethoprim 1 mg/mL eye drops   valsartan-hydrochlorothiazide (DIOVAN-HCT) 320-25 MG per tablet Take 0.5 tablets by mouth daily.   warfarin (COUMADIN) 5 MG tablet Take 5-7.5 mg by mouth. Take 7.'5mg'$  by mouth on Tuesday and Thursday and take 5 mg on Sunday, Monday, Wednesday, Friday and Saturday.      Allergies:   Aspirin, Shellfish allergy, and Tramadol   Social History   Socioeconomic History   Marital status: Married    Spouse name: [name]   Number of children: 2   Years of education: 12   Highest education level: Not on file  Occupational History   Occupation: Plant    Comment: Retired  Tobacco Use   Smoking status: Former    Packs/day: 0.25    Years: 10.00    Total pack years: 2.50    Types: Cigarettes    Quit date: 05/17/1968    Years since quitting: 53.7   Smokeless tobacco: Never  Vaping Use   Vaping Use: Never used  Substance and Sexual Activity   Alcohol use: No   Drug use: No   Sexual activity: Yes  Other Topics Concern   Not on file  Social History Narrative   He lives with his wife [name](, has 2 children, retired.    Hard of hearing.   Right handed.   Caffeine- None   Social Determinants of Health   Financial Resource Strain: Not on file  Food Insecurity: Not on file  Transportation Needs: Not on file  Physical Activity: Not on file  Stress: Not on file  Social Connections: Not on file     Family History: The patient's family history includes Heart disease in his father and mother.  ROS:   Please see the history of present illness.     All other systems reviewed and are negative.  EKGs/Labs/Other Studies Reviewed:    The following studies were reviewed today:   EKG:   10/26/21: Normal sinus rhythm, rate 61, right bundle branch block, left anterior fascicular block, LVH  Recent Labs: 09/17/2021: TSH 1.980  Recent Lipid Panel No results found for: "CHOL", "TRIG", "HDL", "CHOLHDL", "VLDL", "LDLCALC", "LDLDIRECT"  Physical Exam:    VS:  BP (!) 148/60   Pulse (!) 52   Ht '5\' 9"'$  (1.753 m)   Wt 207 lb 9.6 oz (94.2 kg)   SpO2 96%   BMI 30.66 kg/m     Wt Readings from Last 3 Encounters:  01/27/22 207 lb 9.6 oz (94.2 kg)  10/26/21 211 lb 12.8 oz (96.1 kg)  09/17/21 207 lb 8 oz (94.1 kg)     GEN:   in no acute distress HEENT:  Normal NECK: No JVD; No carotid bruits LYMPHATICS: No lymphadenopathy CARDIAC: RRR, no murmurs, rubs, gallops RESPIRATORY:  Clear to auscultation without rales, wheezing or rhonchi  ABDOMEN: Soft, non-tender, non-distended MUSCULOSKELETAL:  No edema; No deformity  SKIN: Warm and dry NEUROLOGIC:  Alert and oriented x 3 PSYCHIATRIC:  Normal affect   ASSESSMENT:    1. Syncope and collapse   2. Pulmonary hypertension, unspecified ([place])   3. Essential hypertension     PLAN:    Syncope:  admitted at [facility]  in April 2023 with syncope.  Head CT showed no acute intracranial findings.  Echocardiogram 09/11/2021 showed EF 60 to 65%, mild MR, mild AI, normal RV function, moderate TR, moderate PAH, small PFO.  Carotid duplex showed minor atherosclerosis but no significant stenosis. -Zio patch x14 days on 11/19/2021 showed 1 episode of NSVT lasting 5 beats, 14 episodes of SVT with longest lasting 11 beats. -Carotid duplex -Recheck echocardiogram  Pulmonary hypertension: Reportedly moderate PAH on echocardiogram 08/2021 at [facility].  Will monitor, plan repeat echocardiogram   Hypertension: Continue valsartan-HCTZ 160-12.5 mg daily.    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  Concerns regarding medicines are outlined above.  Orders Placed This Encounter  Procedures   ECHOCARDIOGRAM COMPLETE   VAS US CAROTID   No orders of the defined types were placed in this encounter.   Patient Instructions  Medication Instructions:  Your physician recommends that you continue on your current medications as directed. Please refer to the Current Medication list given to you today.  *If you need a refill on your cardiac medications before your next appointment, please call your pharmacy*  Testing/Procedures: Your physician  has requested that you have an echocardiogram. Echocardiography is a painless test that uses sound waves to create images of your heart. It provides your doctor with information about the size and shape of your heart and how well your heart's chambers and valves are working. This procedure takes approximately one hour. There are no restrictions for this procedure.  Your physician has requested that you have a carotid duplex. This test is an ultrasound of the carotid arteries in your neck. It looks at blood flow through these arteries that supply the brain with blood. Allow one hour for this exam. There are no restrictions or special instructions.  Follow-Up: At [facility], you and your health needs are our priority.  As part of our continuing mission to provide you with exceptional heart care, we have created designated Provider Care Teams.  These Care Teams include your primary Cardiologist (physician) and Advanced Practice Providers (APPs -  Physician Assistants and Nurse Practitioners) who all work together to provide you with the care you need, when you need it.  We recommend signing up for the patient portal called "MyChart".  Sign up information is provided on this After Visit Summary.  MyChart is used to connect with patients for Virtual Visits (Telemedicine).  Patients are able to view lab/test results, encounter notes, upcoming appointments, etc.  Non-urgent messages can be sent to your provider as well.   To learn more about what you can do with MyChart, go to [email or web address].    Your next appointment:   6 month(s)  The format for your next appointment:   In Person  Provider:   Dr. Gardiner Rhyme      Signed, Donato Heinz, MD  01/27/2022 11:53 AM    [place]

## 2022-01-27 ENCOUNTER — Encounter: Payer: Self-pay | Admitting: Cardiology

## 2022-01-27 ENCOUNTER — Ambulatory Visit: Payer: Medicare Other | Attending: Cardiology | Admitting: Cardiology

## 2022-01-27 VITALS — BP 148/60 | HR 52 | Ht 69.0 in | Wt 207.6 lb

## 2022-01-27 DIAGNOSIS — R55 Syncope and collapse: Secondary | ICD-10-CM | POA: Diagnosis not present

## 2022-01-27 DIAGNOSIS — I10 Essential (primary) hypertension: Secondary | ICD-10-CM | POA: Diagnosis not present

## 2022-01-27 DIAGNOSIS — I272 Pulmonary hypertension, unspecified: Secondary | ICD-10-CM | POA: Diagnosis not present

## 2022-01-27 NOTE — Patient Instructions (Signed)
Medication Instructions:  Your physician recommends that you continue on your current medications as directed. Please refer to the Current Medication list given to you today.  *If you need a refill on your cardiac medications before your next appointment, please call your pharmacy*  Testing/Procedures: Your physician has requested that you have an echocardiogram. Echocardiography is a painless test that uses sound waves to create images of your heart. It provides your doctor with information about the size and shape of your heart and how well your heart's chambers and valves are working. This procedure takes approximately one hour. There are no restrictions for this procedure.  Your physician has requested that you have a carotid duplex. This test is an ultrasound of the carotid arteries in your neck. It looks at blood flow through these arteries that supply the brain with blood. Allow one hour for this exam. There are no restrictions or special instructions.  Follow-Up: At [facility], you and your health needs are our priority.  As part of our continuing mission to provide you with exceptional heart care, we have created designated Provider Care Teams.  These Care Teams include your primary Cardiologist (physician) and Advanced Practice Providers (APPs -  Physician Assistants and Nurse Practitioners) who all work together to provide you with the care you need, when you need it.  We recommend signing up for the patient portal called "MyChart".  Sign up information is provided on this After Visit Summary.  MyChart is used to connect with patients for Virtual Visits (Telemedicine).  Patients are able to view lab/test results, encounter notes, upcoming appointments, etc.  Non-urgent messages can be sent to your provider as well.   To learn more about what you can do with MyChart, go to [email or web address].    Your next appointment:   6 month(s)  The format for your next appointment:    In Person  Provider:   Dr. Gardiner Rhyme

## 2022-01-28 DIAGNOSIS — I4891 Unspecified atrial fibrillation: Secondary | ICD-10-CM | POA: Diagnosis not present

## 2022-02-01 ENCOUNTER — Ambulatory Visit: Payer: Medicare Other | Attending: Cardiology

## 2022-02-01 ENCOUNTER — Ambulatory Visit (INDEPENDENT_AMBULATORY_CARE_PROVIDER_SITE_OTHER): Payer: Medicare Other

## 2022-02-01 DIAGNOSIS — I272 Pulmonary hypertension, unspecified: Secondary | ICD-10-CM | POA: Diagnosis not present

## 2022-02-01 DIAGNOSIS — R55 Syncope and collapse: Secondary | ICD-10-CM | POA: Insufficient documentation

## 2022-02-01 DIAGNOSIS — M1732 Unilateral post-traumatic osteoarthritis, left knee: Secondary | ICD-10-CM | POA: Diagnosis not present

## 2022-02-01 LAB — ECHOCARDIOGRAM COMPLETE
AR max vel: 2.49 cm2
AV Peak grad: 13.4 mmHg
Ao pk vel: 1.83 m/s
Area-P 1/2: 3.19 cm2
Calc EF: 69.7 %
MV M vel: 5.29 m/s
MV Peak grad: 111.8 mmHg
P 1/2 time: 802 msec
S' Lateral: 2.96 cm
Single Plane A2C EF: 62.5 %
Single Plane A4C EF: 75.3 %

## 2022-02-02 DIAGNOSIS — I2699 Other pulmonary embolism without acute cor pulmonale: Secondary | ICD-10-CM | POA: Diagnosis not present

## 2022-02-02 DIAGNOSIS — I4891 Unspecified atrial fibrillation: Secondary | ICD-10-CM | POA: Diagnosis not present

## 2022-02-08 ENCOUNTER — Telehealth: Payer: Self-pay | Admitting: Cardiology

## 2022-02-08 DIAGNOSIS — G7001 Myasthenia gravis with (acute) exacerbation: Secondary | ICD-10-CM | POA: Diagnosis not present

## 2022-02-08 NOTE — Telephone Encounter (Signed)
Follow Up:     Wife I returning Hayley's call from today.u

## 2022-02-08 NOTE — Telephone Encounter (Signed)
Spoke to wife (ok per DPR)-aware of results and recommendations.  She is unsure if he has had a recent lipid profile checked (will call PCP to see) but states he does not want to go on any medication right now.  He has myasthenia gravis and is on a lot medication for this.     Advised would make MD aware.    Will call to check for labs to have baseline in system.

## 2022-02-09 DIAGNOSIS — G7001 Myasthenia gravis with (acute) exacerbation: Secondary | ICD-10-CM | POA: Diagnosis not present

## 2022-02-10 DIAGNOSIS — G7001 Myasthenia gravis with (acute) exacerbation: Secondary | ICD-10-CM | POA: Diagnosis not present

## 2022-02-11 DIAGNOSIS — G7001 Myasthenia gravis with (acute) exacerbation: Secondary | ICD-10-CM | POA: Diagnosis not present

## 2022-02-12 DIAGNOSIS — I4891 Unspecified atrial fibrillation: Secondary | ICD-10-CM | POA: Diagnosis not present

## 2022-02-12 DIAGNOSIS — Z86711 Personal history of pulmonary embolism: Secondary | ICD-10-CM | POA: Diagnosis not present

## 2022-02-18 DIAGNOSIS — E559 Vitamin D deficiency, unspecified: Secondary | ICD-10-CM | POA: Diagnosis not present

## 2022-02-18 DIAGNOSIS — E039 Hypothyroidism, unspecified: Secondary | ICD-10-CM | POA: Diagnosis not present

## 2022-02-18 DIAGNOSIS — G7000 Myasthenia gravis without (acute) exacerbation: Secondary | ICD-10-CM | POA: Diagnosis not present

## 2022-02-18 DIAGNOSIS — N189 Chronic kidney disease, unspecified: Secondary | ICD-10-CM | POA: Diagnosis not present

## 2022-02-18 DIAGNOSIS — R7303 Prediabetes: Secondary | ICD-10-CM | POA: Diagnosis not present

## 2022-02-18 DIAGNOSIS — E7849 Other hyperlipidemia: Secondary | ICD-10-CM | POA: Diagnosis not present

## 2022-02-18 DIAGNOSIS — I10 Essential (primary) hypertension: Secondary | ICD-10-CM | POA: Diagnosis not present

## 2022-02-19 DIAGNOSIS — I4891 Unspecified atrial fibrillation: Secondary | ICD-10-CM | POA: Diagnosis not present

## 2022-02-22 DIAGNOSIS — R7303 Prediabetes: Secondary | ICD-10-CM | POA: Diagnosis not present

## 2022-02-22 DIAGNOSIS — R55 Syncope and collapse: Secondary | ICD-10-CM | POA: Diagnosis not present

## 2022-02-22 DIAGNOSIS — G7000 Myasthenia gravis without (acute) exacerbation: Secondary | ICD-10-CM | POA: Diagnosis not present

## 2022-02-22 DIAGNOSIS — Z6830 Body mass index (BMI) 30.0-30.9, adult: Secondary | ICD-10-CM | POA: Diagnosis not present

## 2022-02-22 DIAGNOSIS — Z0001 Encounter for general adult medical examination with abnormal findings: Secondary | ICD-10-CM | POA: Diagnosis not present

## 2022-02-22 DIAGNOSIS — E559 Vitamin D deficiency, unspecified: Secondary | ICD-10-CM | POA: Diagnosis not present

## 2022-02-22 DIAGNOSIS — Z86711 Personal history of pulmonary embolism: Secondary | ICD-10-CM | POA: Diagnosis not present

## 2022-02-26 DIAGNOSIS — H1032 Unspecified acute conjunctivitis, left eye: Secondary | ICD-10-CM | POA: Diagnosis not present

## 2022-02-26 DIAGNOSIS — I4891 Unspecified atrial fibrillation: Secondary | ICD-10-CM | POA: Diagnosis not present

## 2022-03-01 DIAGNOSIS — G7001 Myasthenia gravis with (acute) exacerbation: Secondary | ICD-10-CM | POA: Diagnosis not present

## 2022-03-02 DIAGNOSIS — G7001 Myasthenia gravis with (acute) exacerbation: Secondary | ICD-10-CM | POA: Diagnosis not present

## 2022-03-03 DIAGNOSIS — D0439 Carcinoma in situ of skin of other parts of face: Secondary | ICD-10-CM | POA: Diagnosis not present

## 2022-03-03 DIAGNOSIS — C44619 Basal cell carcinoma of skin of left upper limb, including shoulder: Secondary | ICD-10-CM | POA: Diagnosis not present

## 2022-03-03 DIAGNOSIS — L570 Actinic keratosis: Secondary | ICD-10-CM | POA: Diagnosis not present

## 2022-03-03 DIAGNOSIS — L578 Other skin changes due to chronic exposure to nonionizing radiation: Secondary | ICD-10-CM | POA: Diagnosis not present

## 2022-03-08 ENCOUNTER — Telehealth: Payer: Self-pay | Admitting: Neurology

## 2022-03-08 NOTE — Telephone Encounter (Signed)
Laboratory evaluation from [facility] dated February 18, 2022  Hemoglobin 11.7, creatinine 1.7, potassium 5.2, tead5, triglyceride 170, A1c 5.1, vitamin D 26.5,  Hemoglobin in April 2023 was 11.6,

## 2022-03-17 DIAGNOSIS — L578 Other skin changes due to chronic exposure to nonionizing radiation: Secondary | ICD-10-CM | POA: Diagnosis not present

## 2022-03-17 DIAGNOSIS — L244 Irritant contact dermatitis due to drugs in contact with skin: Secondary | ICD-10-CM | POA: Diagnosis not present

## 2022-03-24 NOTE — Progress Notes (Unsigned)
ASSESSMENT AND PLAN 88 y.o. year old male   13.  Seropositive myasthenia gravis with exacerbation, Following his COVID diagnosis in April 2022, increased difficulty with swallowing, general weakness Continued complaints of weakness following 6 days of Medrol pack, higher dose of Mestinon, restarted on IVIG treatment, loading dose on May 18, followed by 1 g/kg maintenance dose June 15, November 18, 2020, last infusion was in April 2023 Took a break from IVIG May-August 2023, restarted in timbre 2023 due to difficulty swallowing, continues to benefit from IVIG, wishes to continue, still working in his yard Continue IVIG 1 g/kg every 4 weeks. 50 g x 2 days Continue Mestinon 60 mg 4 times daily  Not a good candidate for long-term immunosuppressive treatment, due to previous history of extensive skin cancer, could not tolerate CellCept, Imuran   2.  Shortness of breath  Baseline intermittent shortness of breath with exertion, this has been a problem since his PE in 2003, on chronic Coumadin treatment  He also has mild persistent bilateral eye closure, cheek puff muscle weakness due to multiple facial skin surgery, blepharoplasty  3.  Syncope episode April 2023 -EEG was normal -No further spells reported -Could benefit from B12 supplement  -Return in 6 months with Dr. [name]     DIAGNOSTIC DATA (LABS, IMAGING, TESTING) - I reviewed patient records, labs, notes, testing and imaging myself    Labs in May 2023 low normal B 12 259, TSH 1.980, CK 209  EEG was normal  MRI of the brain showed no acute abnormality.  There was evidence of chronic left cerebellar infarction, moderate supratentorium small vessel disease.  HISTORY OF PRESENT ILLNESS:  Calvin Sloan  is a 13 -year-old right-handed white married male with generalized seropositive myasthenia gravis characterized by bulbar weakness, shortness of breath , and dysphagia. Patient of [name]   He was diagnosed in 10/2002 with a  positive  Tensilon test, positive acetylchoine receptor antibody,and had a positive ANA at 1-160 speckled pattern. Initially he had right eye ptosis and subsequently, double vision, fatigue with chewing, dysphagia, and upper and lower extremity weakness. He was initially treated with Mestinon bromide in June 2004,  which was associated with GI side effects.   Prednisone was started in 03/29/2003 and CellCept 05/2003. He developed increased jitteriness, rash, and cramps on CellCept which was discontinued 10/2003. He had progressive bulbar weakness and was admitted 12/2003 for IV IgG therapy which required a PEG tube for dysphagia transiently. He was started on Imuran. 01/17/2004 he was readmitted for 2 days of IV IgG because of shortness of breath. He has not been hospitalized since 01/2004.    He has been slowly tapered off prednisone 09/2007 and was on Imuran and Mestinon bromide.    He had B12 deficiency and was receiving B12 shots ,but  B12 levels off injections have been normal and B12 shots were discontinued.   He has  a history of vague visual loss on the left of unknown etiology. He denies swallowing problems, speech problems, voice changes, focal weakness, or double vision.   He is independent in activities of daily living and takes one nap per day.He denies memory loss but his wife states that he does have memory loss. His last blood studies in my office 05/27/10 were normal except a low white blood cell count of 3900.   His dermatologist [name] was concerned that the Imuran is associated with increased risk of skin cancer and the imuran was discontinued in 12/30/2010.  His skin cancer (basal cell cancer) has improved since his imuran was stopped.   He has noted no change In myasthenic symptoms. His skin has improved. His medication for myasthenia is mesinon bromide 60  milligrams one and one half tablets 4 times a day. He has shortness of breath since his pulmonary emboli in 2003, also complains of  excessive fatigue.    He is allergic to aspirin, GI side effect, gastric ulcer,  and is on Plavix for a history of TIAs with right arm weakness He has  swallowing problems at the beginning of the meal with liquids that improves as the meal continues.     He had right CTS release in May 2014 by [name].   He complains of shortness of breath with walking, bending over to tire his shoed, due to PE 2003, pulmnolgist folllowed up, he does not like to chew on tough beef, he denies swallowing difficulty, no double vision, no droopy eyelid, no significant bleeding muscle weakness. He is active in yard, shop, threadmill     UPDATE Jan 16th 2015: He is now tapering off Mestinon, there was no significant change in his functional status, he denies double vision, no ptosis, continue complains of shortness of breath with exertion no significant and muscle weakness, he has to wash down his food sometimes, but no significant swelling, or chewing difficulty, no dysarthria   UPDATE July 16th 2015: He is no longer mestinon, he does complains of increased breathing difficulty from prolonged walking, no chewing difficulty, no double vision, no ptosis.   Laboratory continue to demonstrate positive acetylcholine binding, and modulating antibodies.  He did reported that his skin cancer has improved after stopping imuran, could not tolerate cellcept per record.  Previously he responded to IVIG very well.   UPDATE Sept 25th 2015: He was stated on prednisone '10mg'$  qday, and mestinon '60mg'$  tid in June 2015, which did help his fatigue, shortness of breath, wife reported 50% improvement, he has increased appetite   But he was admitted to [facility] hospital in Sept 17th for bilateral PEs, right leg DVT, he is started on coumadin, bridge with Lovenox, [name] is monitoring his INR   He denies double vision, no ptosis, no limb muscle weakness, no dysphagia   UPDATE April 4th 2016: He is on coumadin for PE, INR was  monitored by [name], his myasthenia gravis is under good control, is taking prednisone 10 mg every day, he has surgery in Nov 2015 for right leg cellulitis.   He only does little around his house, he has no double vision, SOB due to previous PE,    UPDATE Feb 18 2015: Previous laboratory evaluation showed mild B12 deficiency with level of 204, He did receive IM B12 supplement at his primary care Dr. [name] office for a while, now with normal repeat laboratory evaluation, injection has stopped He has mild choking difficulty, this has been going on since August 2016, not getting worse,   he has more trouble with liquid. He complains of shortness of breath when bending over. He denies significant gait difficulty, no breathing difficulty otherwise, he denies double vision, no ptosis,, No chewing difficulties     UPDATE May 22 2015: He came in last visit April 16 2015 complains of worsening blurry vision, double vision, swallowing difficulties, trouble talking, he reported 50% weakness compared to baseline, difficulty getting up from seated position, difficulty chewing, worsening shortness of breath, on examination, he was noted to have increased weakness compared to  previous examination, he had moderate eye-closure cheek puff, mild neck flexion weakness, mild bilateral shoulder abduction bilateral hip flexion weakness.   He was treated with IVIG 400 mg/kg every day for 5 days from December 11 to May 02 2015, his vision has much improved, he can walk better, but he continue have swallowing difficulty, dysarthria, he is also taking prednisone 40 mg every day, increase his Mestinon 60 mg to 3-4 tablets daily   He was not able to tolerate immunosuppression treatment in the past due to skin cancer   UPDATE Jul 07 2015:   I have called his wife, pulmonary function test in June 05 2015.   1, FVC is 3.1 5, 87%, this is normal 2. FEV1 is 2.1 6, 77%, with a percent FEV1 of 68, this indicated mild  to moderate primary small  obstruction in this patient was a history of cigarette smoking, large elderly flow rates are generally normal   3. Flow volume loop is unremarkable 4, lung volumes indicate some evidence of air trapping with an increased residual volume, the TLC is normal, the increased residual volume confirms the presence of airflow obstruction   5, DLCO is 19.0, 90%, this is normal 6, elderly resistant 3.7 2, 265%, this is increased   Wife reported, he recently had swallowing study, there was no significant abnormality found, his swallowing, breathing, symptoms overall has improved   He did think that IVIG treatment in Dec 11-16th 2016 has helped his blurry vision, it is gone, he still feel straggled sometimes, he has shortness with exertion and bending over. His talking is much better.   UPDATE May 22nd 2017: He continue complains of shortness of breath with minimum exertion, no diplopia, no double vision, mild dysphasia,   He had a history of right carpal tunnel release surgery in the past, which has been helpful, now complains of left first 3 finger paresthesia, EMG nerve conduction studies was planned at local hospital   UPDATE July 6th 2017: He stopped taking Mestinon around May 20 third 2017, shortly afterwards, he noticed double vision, he clearly describe eighth of binocular double vision, sometimes vertigo, sometimes horizontal, he went on higher dose of Mestinon 60 mg 4 times a day without improving his symptoms, he slept on a bed with 30 head raise, with no significant breathing difficulty, he could not eat his dinner on July 5th 2017 due to chewing swallowing difficulty, he could not fly flat sleeping, mildly unsteady gait, increased fatigue,   UPDATE August 17th 2017: Last IVIG treatment was December 05 2015, he did very well for a while, was able to cut grass, trim his lawn, it did helped him for at least 2 weeks, then he had passing out    I reviewed laboratory  evaluation in August 2017, normal B12, ferritin level, iron binding capacity was within normal limit, low hemoglobin 10 point 8, creatinine was elevated 1.53, glucose was 125   Previous laboratory evaluation in 2016 showed normal folic acid 12 point 5,low normal B12 level 266, hemoglobin in January 2015 was 14 point 3   He will be evaluated by hematologist/oncologist Dr. Mervin Kung in September 6th 2017, at [facility]   He complains of low back pain since he fell you August 1st 2017, has been taking intermittent Tylenol,   He complains of difficulty swallowing, both liquid, and solid food, no double vision, He is taking mestinon '60mg'$  4 times a day, which did help him.   Update January 29 2016: I  have reviewed his hematologist Dr. Grier Mitts note on September 6th 2017, normocytic microchromic anemia, normal WBC, platelet count, likely from chronic kidney disease, history of fetal deficiency, is on B12 supplement, there was a consideration of low level hemolytic anemia from IVIG infusion, no overt evidence of blood loss   I reviewed laboratory evaluation in September 2017, parietal cell antibody IgG was negative, intrinsic factor negative, mild elevated kappa free light chain, Hg 13.8, creat 1.38, GFR 47, ferritin 163, normal TSh 1.5, elevated vitamin B12 1480   He complains more fatigue, more swallowing difficulty, decreased po intake, he could not drinking water because worry about the choking episode, increased shortness of breath with exertion, intermittent blurry version, difficulty closing his eyes.   He is now taking mestinon '60mg'$  qid, no significant side effect, was not sure about the benefit.   Also reviewed swallowing evaluation on June 06 2015 at [facility], there was mild oropharyngeal dysphagia, characterized by spillover to the valleculae and the piriform sinus secondary to reduced oral motor coordination    patient was felt to safe to consume a regular diet  with a full range of liquid with  recommendation of upright for food by mouth, and 45 minutes after meals, head flexed slightly forward , swallow at slower rate   UPDATE Oct 16th 2017: I reviewed laboratory in October 2017, immune of fixative electrophoresis showed mild elevated Ig G2.8, otherwise was normal, mild anemia hemoglobin 11.7, mild elevated kappa.free light chain 26, mild elevated creatinine 1.38, normal ferritin 163, normal iron panel, TSH, folic acid, X54 0086,   Last ivig was in Sept, it has helped him 80%,  he can chew and swallow, he continue complains of blurry vision,   UPDATE May 05 2016: He is overall much improved, but still complains of SOB, is at his baseline, last infusion was on Nov 27, 28th, he will have more infusion in December, he has mild dysarthria, slight dysphagia will see hematologist Dr. Whitney Muse in January 2018,    UPDATE Oct 04 2018: Patient complains of gradual onset swallowing difficulty since April 2020, to the point he has to modify his daily diet, he can only eat soft food, also increased dyspnea upon exertion, has to cut back his daily activity, he denies double vision, no droopy eyelid, denies difficulty to close his eyes tightly, he denies significant limb muscle weakness,   Update December 13, 2018: He is accompanied by his wife at today's clinic visit, he had received IVIG treatment in June, July, planning on receiving it again on August 3 to 10/2018, he tolerated infusion well, noticed moderate to significant improvement of his muscle weakness, 80% back to his baseline, he can swallowing better, move better, but continue noticed mild bilateral upper extremity weakness, taking Mestinon 4 times a day, prednisone 10 mg daily   UPDATE July 16 2019: He had IVIG treatment in June, July, August, September 2020, after that, he was able to bounce back to baseline, gradually tapered off prednisone treatment,   He is still taking Mestinon 60 mg 4 times a day,  complains of shortness of breath occasionally denied double vision, swallowing difficulty  UPDATE Mar 19 2020: Because of the increased swallowing difficulty, double vision, he received IVIG treatment loading dose 2 g/kg in September, maintenance dose in October, he reported significant improvement, now close to baseline, gained 7 pounds over the past few months, has third infusion planned in November  He only take Mestinon as needed, no longer taking prednisone,  UPDATE December 02 2020:  Following his COVID infection on August 28, 2020, presented with cough, stomachache, increased difficulty breathing, also increased swallowing, dysarthria, despite taking higher dose of Mestinon 1.5 mg 3 times a day, also complains of generalized weakness, decreased mobility  He was noted to have mild neck flexion, proximal upper lower extremity muscle weakness at last visit on Sep 16, 2020 with several, we decided to proceed with IVIG,  Loading dose 2 g/kg on May 18, followed by maintenance dose 1 g/kg on June 15, November 18, 2020  Now he feels much better, back to his baseline, still has frequent cough, mild shortness of breath, on residual deficit of his PE, no longer has swallowing difficulty, getting stronger, no significant gait abnormality,   UPDATE December 29 2020: He was doing so well last visit on December 02, 2020, we decided to hold off IVIG maintenance dose 1 g/kg, he reported a week history of gradually worsening swallowing chewing difficulty, despite taking Mestinon up to 60 mg 3 times a day, he described difficulty to swallow down food, has to wash down with drinks, induced cough sometimes also noticed mild increased slurred voice,  He had a history of PE, taking Coumadin, shortness of breath at baseline, no significant change, no significant gait abnormality, he denies double vision  Update April 21, 2021 He is accompanied by his wife at today's visit, tolerating IVIG 1 g/kg every 4 weeks, continue to  require Mestinon 60 mg 4 times a day, still complains of frequent fatigue, difficulty getting up from seated position, mild swallowing difficulty especially at the end of 3 weeks, difficulty closing eyes, frequent tearing, denies significant double vision  UPDATE Sep 17 2021: He is accompanied by his wife at today's clinical visit, his myasthenia gravis is overall under good control, has on continued IVIG infusions since July 2022, he denies swallowing difficulty, no double vision, baseline shortness of breath due to his previous PE, multiple facial skin cancer surgery, right eye plastic surgery due to difficulty closing his right eye, mild bilateral eye closure weakness   He had one passing out episode was treated at [facility] on September 10, 2021, he got up at his usual hours, finished breakfast, lab work on his lawnmower, he lied on the floor to take off the blade, sharp the blade in a standing position, limited by back on the floor to put it into place,  After that he went home, wife changed his skin dressing at the posterior ear, where he had recent skin cancer surgery, he complains of significant pain, during the process, he suddenly went limp, feel body jerks, unresponsive, wife called 911, he quickly came around  Now he is back to his baseline, he has no recollection of the event, no warning signs, deny a previous history of seizure  Telemetry monitoring showed no significant abnormality,  Laboratory evaluation on September 11 2021, hemoglobin was 12, elevated creatinine 1.55, albumin was 3.3, normal liver functional test, hypersensitivity troponin was elevated to 102, alcohol less than 3, lactate 1.1, Ultrasound of Doppler study showed mild carotid atherosclerosis, no significant stenosis, antegrade flow bilateral vertebral artery Echocardiogram showed ejection fraction 60 to 65%, moderate tricuspid regurgitation, moderate pulmonary hypertension  CT head without contrast showed  atrophy, ventriculomegaly, periventricular white matter disease, remote left cerebellar infarction, Update March 25, 2022 SS: Here today with his wife. IVIG was restarted September 2023 due to worsening swallowing ability following URI.  2 g/kg loading, divided into 4 days 50 g  daily x 4 days/followed 1 g/kg every 4 weeks 50 g x 2 days.  He is doing better now with IVIG. No trouble with swallowing now, chews well. No change in weakness. Still works in his yard, getting leaves up. Stopped IVIG May-August.  Remains on Mestinon.  No further seizure-like spells.  Wishes to remain on IVIG.  PHYSICAL EXAM  Vitals:   12/02/20 1255  BP: (!) 149/60  Pulse: (!) 59  Weight: 209 lb 8 oz (95 kg)  Height: '5\' 9"'$  (1.753 m)   Body mass index is 30.94 kg/m.  Physical Exam  General: The patient is alert and cooperative at the time of the examination.  Very nice, pleasant  Skin: No significant peripheral edema is noted. Redness to cheeks noted.   Neurologic Exam  Mental status: The patient is alert and oriented x 3 at the time of the examination. The patient has apparent normal recent and remote memory, with an apparently normal attention span and concentration ability.  Voice is soft, slightly hoarse.  Hard of hearing.  Cranial nerves: Facial symmetry is present. Extraocular movements are full. Visual fields are full.  Mild to moderate eye open weakness, mild cheek puff weakness.  Right lateral eye open is narrow from surgery.   Motor: The patient has good strength in all 4 extremities.  No neck weakness was noted.  Sensory examination: Soft touch sensation is symmetric on the face, arms, and legs.  Coordination: The patient has good finger-nose-finger and heel-to-shin bilaterally.  Gait and station: Able to stand from seated position without pushoff, gait is wide-based, but steady and independent  Reflexes: Deep tendon reflexes are symmetric but decreased  REVIEW OF SYSTEMS:  Out of a complete  14 system review of symptoms, the patient complains only of the following symptoms, and all other reviewed systems are negative.  See HPI  ALLERGIES: Allergies  Allergen Reactions   Aspirin Nausea Only   Shellfish Allergy Swelling   Tramadol Nausea Only and Rash    HOME MEDICATIONS: Outpatient Medications Prior to Visit  Medication Sig Dispense Refill   fluorouracil (EFUDEX) 5 % cream Apply topically.     ketoconazole (NIZORAL) 2 % shampoo ketoconazole 2 % shampoo     metroNIDAZOLE (METROGEL) 1 % gel Apply 1 application topically daily.     pyridostigmine (MESTINON) 60 MG tablet TAKE 1 TABLET IN THE MORNING  , AT NOON  , IN THE EVENING  AND AT BEDTIME 360 tablet 1   trimethoprim-polymyxin b (POLYTRIM) ophthalmic solution polymyxin B sulfate 10,000 unit-trimethoprim 1 mg/mL eye drops     valsartan-hydrochlorothiazide (DIOVAN-HCT) 320-25 MG per tablet Take 0.5 tablets by mouth daily.     warfarin (COUMADIN) 5 MG tablet Take 5-7.5 mg by mouth. Take 7.'5mg'$  by mouth on Tuesday and Thursday and take 5 mg on Sunday, Monday, Wednesday, Friday and Saturday.     No facility-administered medications prior to visit.    PAST MEDICAL HISTORY: Past Medical History:  Diagnosis Date   B12 deficiency    COPD (chronic obstructive pulmonary disease) ([place])    Essential tremor    Hypertension    Memory deficit    Myasthenia gravis ([place])    since 2004   PONV (postoperative nausea and vomiting)    Pulmonary emboli ([place])    in Nov 2003   Skin cancer     PAST SURGICAL HISTORY: Past Surgical History:  Procedure Laterality Date   CARPAL TUNNEL RELEASE Right 09/14/2012   Procedure: CARPAL TUNNEL RELEASE;  Surgeon:  Cammie Sickle., MD;  Location: [facility];  Service: Orthopedics;  Laterality: Right;   CARPAL TUNNEL RELEASE Left 11/04/2015   Procedure: LEFT CARPAL TUNNEL RELEASE, EXTENDED INCISION;  Surgeon: Daryll Brod, MD;  Location: [facility];  Service: Orthopedics;   Laterality: Left;  ANESTHESIA: IV REGIONAL UPPER ARM   CATARACT EXTRACTION Right    CATARACT EXTRACTION W/PHACO Left 03/12/2021   Procedure: CATARACT EXTRACTION PHACO AND INTRAOCULAR LENS PLACEMENT (IOC);  Surgeon: Baruch Goldmann, MD;  Location: [facility];  Service: Ophthalmology;  Laterality: Left;  CDE-42.07   CHOLECYSTECTOMY  2012   [facility]   COLONOSCOPY     FOOT SURGERY  1966   lt orif foot   SKIN CANCER EXCISION      FAMILY HISTORY: Family History  Problem Relation Age of Onset   Heart disease Mother    Heart disease Father     SOCIAL HISTORY: Social History   Socioeconomic History   Marital status: Married    Spouse name: [name]   Number of children: 2   Years of education: 12   Highest education level: Not on file  Occupational History   Occupation: Plant    Comment: Retired  Tobacco Use   Smoking status: Former    Packs/day: 0.25    Years: 10.00    Total pack years: 2.50    Types: Cigarettes    Quit date: 05/17/1968    Years since quitting: 53.8   Smokeless tobacco: Never  Vaping Use   Vaping Use: Never used  Substance and Sexual Activity   Alcohol use: No   Drug use: No   Sexual activity: Yes  Other Topics Concern   Not on file  Social History Narrative   He lives with his wife [name](, has 2 children, retired.    Hard of hearing.   Right handed.   Caffeine- None   Social Determinants of Health   Financial Resource Strain: Not on file  Food Insecurity: Not on file  Transportation Needs: Not on file  Physical Activity: Not on file  Stress: Not on file  Social Connections: Not on file  Intimate Partner Violence: Not on file    Calvin Sloan, Laqueta Jean, [facility] [address], [place], [place] [address] [phone]

## 2022-03-25 ENCOUNTER — Ambulatory Visit (INDEPENDENT_AMBULATORY_CARE_PROVIDER_SITE_OTHER): Payer: Medicare Other | Admitting: Neurology

## 2022-03-25 ENCOUNTER — Encounter: Payer: Self-pay | Admitting: Neurology

## 2022-03-25 VITALS — BP 130/61 | HR 60 | Ht 69.0 in | Wt 202.0 lb

## 2022-03-25 DIAGNOSIS — G7001 Myasthenia gravis with (acute) exacerbation: Secondary | ICD-10-CM | POA: Diagnosis not present

## 2022-03-25 MED ORDER — PYRIDOSTIGMINE BROMIDE 60 MG PO TABS: ORAL_TABLET | ORAL | 1 refills | Status: DC

## 2022-03-29 DIAGNOSIS — G7001 Myasthenia gravis with (acute) exacerbation: Secondary | ICD-10-CM | POA: Diagnosis not present

## 2022-03-30 DIAGNOSIS — G7001 Myasthenia gravis with (acute) exacerbation: Secondary | ICD-10-CM | POA: Diagnosis not present

## 2022-03-31 DIAGNOSIS — I4891 Unspecified atrial fibrillation: Secondary | ICD-10-CM | POA: Diagnosis not present

## 2022-03-31 DIAGNOSIS — R030 Elevated blood-pressure reading, without diagnosis of hypertension: Secondary | ICD-10-CM | POA: Diagnosis not present

## 2022-04-07 DIAGNOSIS — I4891 Unspecified atrial fibrillation: Secondary | ICD-10-CM | POA: Diagnosis not present

## 2022-04-07 DIAGNOSIS — R030 Elevated blood-pressure reading, without diagnosis of hypertension: Secondary | ICD-10-CM | POA: Diagnosis not present

## 2022-04-15 DIAGNOSIS — R030 Elevated blood-pressure reading, without diagnosis of hypertension: Secondary | ICD-10-CM | POA: Diagnosis not present

## 2022-04-15 DIAGNOSIS — I4891 Unspecified atrial fibrillation: Secondary | ICD-10-CM | POA: Diagnosis not present

## 2022-04-21 DIAGNOSIS — I4891 Unspecified atrial fibrillation: Secondary | ICD-10-CM | POA: Diagnosis not present

## 2022-04-21 DIAGNOSIS — R030 Elevated blood-pressure reading, without diagnosis of hypertension: Secondary | ICD-10-CM | POA: Diagnosis not present

## 2022-04-27 DIAGNOSIS — G7001 Myasthenia gravis with (acute) exacerbation: Secondary | ICD-10-CM | POA: Diagnosis not present

## 2022-04-28 DIAGNOSIS — G7001 Myasthenia gravis with (acute) exacerbation: Secondary | ICD-10-CM | POA: Diagnosis not present

## 2022-05-03 DIAGNOSIS — M1732 Unilateral post-traumatic osteoarthritis, left knee: Secondary | ICD-10-CM | POA: Diagnosis not present

## 2022-05-18 DIAGNOSIS — D0439 Carcinoma in situ of skin of other parts of face: Secondary | ICD-10-CM | POA: Diagnosis not present

## 2022-05-18 DIAGNOSIS — C44329 Squamous cell carcinoma of skin of other parts of face: Secondary | ICD-10-CM | POA: Diagnosis not present

## 2022-05-18 DIAGNOSIS — R229 Localized swelling, mass and lump, unspecified: Secondary | ICD-10-CM | POA: Diagnosis not present

## 2022-05-18 DIAGNOSIS — D485 Neoplasm of uncertain behavior of skin: Secondary | ICD-10-CM | POA: Diagnosis not present

## 2022-05-18 DIAGNOSIS — L718 Other rosacea: Secondary | ICD-10-CM | POA: Diagnosis not present

## 2022-05-18 DIAGNOSIS — C44619 Basal cell carcinoma of skin of left upper limb, including shoulder: Secondary | ICD-10-CM | POA: Diagnosis not present

## 2022-05-18 DIAGNOSIS — Z86007 Personal history of in-situ neoplasm of skin: Secondary | ICD-10-CM | POA: Diagnosis not present

## 2022-05-18 DIAGNOSIS — Z08 Encounter for follow-up examination after completed treatment for malignant neoplasm: Secondary | ICD-10-CM | POA: Diagnosis not present

## 2022-05-19 DIAGNOSIS — I4891 Unspecified atrial fibrillation: Secondary | ICD-10-CM | POA: Diagnosis not present

## 2022-05-19 DIAGNOSIS — R030 Elevated blood-pressure reading, without diagnosis of hypertension: Secondary | ICD-10-CM | POA: Diagnosis not present

## 2022-05-19 DIAGNOSIS — N189 Chronic kidney disease, unspecified: Secondary | ICD-10-CM | POA: Diagnosis not present

## 2022-05-24 DIAGNOSIS — G7001 Myasthenia gravis with (acute) exacerbation: Secondary | ICD-10-CM | POA: Diagnosis not present

## 2022-05-25 DIAGNOSIS — G7001 Myasthenia gravis with (acute) exacerbation: Secondary | ICD-10-CM | POA: Diagnosis not present

## 2022-05-28 DIAGNOSIS — E875 Hyperkalemia: Secondary | ICD-10-CM | POA: Diagnosis not present

## 2022-05-28 DIAGNOSIS — R55 Syncope and collapse: Secondary | ICD-10-CM | POA: Diagnosis not present

## 2022-05-28 DIAGNOSIS — G7000 Myasthenia gravis without (acute) exacerbation: Secondary | ICD-10-CM | POA: Diagnosis not present

## 2022-05-28 DIAGNOSIS — R7303 Prediabetes: Secondary | ICD-10-CM | POA: Diagnosis not present

## 2022-05-28 DIAGNOSIS — E559 Vitamin D deficiency, unspecified: Secondary | ICD-10-CM | POA: Diagnosis not present

## 2022-05-28 DIAGNOSIS — R030 Elevated blood-pressure reading, without diagnosis of hypertension: Secondary | ICD-10-CM | POA: Diagnosis not present

## 2022-05-28 DIAGNOSIS — Z86711 Personal history of pulmonary embolism: Secondary | ICD-10-CM | POA: Diagnosis not present

## 2022-05-28 DIAGNOSIS — Z6831 Body mass index (BMI) 31.0-31.9, adult: Secondary | ICD-10-CM | POA: Diagnosis not present

## 2022-06-18 DIAGNOSIS — I4891 Unspecified atrial fibrillation: Secondary | ICD-10-CM | POA: Diagnosis not present

## 2022-06-18 DIAGNOSIS — I82401 Acute embolism and thrombosis of unspecified deep veins of right lower extremity: Secondary | ICD-10-CM | POA: Diagnosis not present

## 2022-06-18 DIAGNOSIS — R030 Elevated blood-pressure reading, without diagnosis of hypertension: Secondary | ICD-10-CM | POA: Diagnosis not present

## 2022-06-18 DIAGNOSIS — Z86711 Personal history of pulmonary embolism: Secondary | ICD-10-CM | POA: Diagnosis not present

## 2022-06-21 DIAGNOSIS — G7001 Myasthenia gravis with (acute) exacerbation: Secondary | ICD-10-CM | POA: Diagnosis not present

## 2022-06-22 DIAGNOSIS — G7001 Myasthenia gravis with (acute) exacerbation: Secondary | ICD-10-CM | POA: Diagnosis not present

## 2022-06-25 DIAGNOSIS — Z86711 Personal history of pulmonary embolism: Secondary | ICD-10-CM | POA: Diagnosis not present

## 2022-06-25 DIAGNOSIS — R030 Elevated blood-pressure reading, without diagnosis of hypertension: Secondary | ICD-10-CM | POA: Diagnosis not present

## 2022-06-25 DIAGNOSIS — I4891 Unspecified atrial fibrillation: Secondary | ICD-10-CM | POA: Diagnosis not present

## 2022-07-21 DIAGNOSIS — G7001 Myasthenia gravis with (acute) exacerbation: Secondary | ICD-10-CM | POA: Diagnosis not present

## 2022-07-22 DIAGNOSIS — G7001 Myasthenia gravis with (acute) exacerbation: Secondary | ICD-10-CM | POA: Diagnosis not present

## 2022-07-23 DIAGNOSIS — I4891 Unspecified atrial fibrillation: Secondary | ICD-10-CM | POA: Diagnosis not present

## 2022-07-23 DIAGNOSIS — R030 Elevated blood-pressure reading, without diagnosis of hypertension: Secondary | ICD-10-CM | POA: Diagnosis not present

## 2022-07-25 NOTE — Progress Notes (Deleted)
Cardiology Office Note:    Date:  01/27/2022   ID:  Calvin Sloan, DOB [birth year], MRN [id number]  PCP:  [facility]  Cardiologist:  None  Electrophysiologist:  None   Referring MD: [facility]*   Chief Complaint  Patient presents with   Loss of Consciousness    History of Present Illness:    Calvin Sloan is a 89 y.o. male with a hx of myasthenia gravis, COPD, hypertension, DVT/PE who presents for follow-up.  He was referred for evaluation of syncope, initially seen 10/26/2021.  He was admitted at [facility] in April 2023 with syncope.  Head CT showed no acute intracranial findings.  Echocardiogram 09/11/2021 showed EF 60 to 65%, mild MR, mild AI, normal RV function, moderate TR, moderate PAH, small PFO.  Carotid duplex showed minor atherosclerosis but no significant stenosis.  He reports syncopal episode occurred after he was working on his lawnmower and started to feel hot.  He sat down and wife was changing a dressing on his ear, as he had recent skin cancer removal.  He reports he was feeling better as he was sitting but then had sudden loss of consciousness.  Wife reports he was unconscious for about 30 seconds.  He denied any chest pain, or palpitations.  No episodes of lightheadedness or syncope since that time.  He has chronic dyspnea which he attributes to his COPD and myasthenia.  He does report 1 prior episode of syncope that occurred 5 years ago while working in the heat.  No bleeding issues on warfarin.  He smoked about 15 years, quit in 1970.  No family history of heart disease.    Zio patch x14 days on 11/19/2021 showed 1 episode of NSVT lasting 5 beats, 14 episodes of SVT with longest lasting 11 beats.  Echocardiogram 02/01/2022 showed EF 65 to [id number], grade 2 diastolic dysfunction, normal RV function, mild RV enlargement, moderately elevated RVSP 47 mmHg, mild left atrial enlargement, moderate right atrial enlargement, moderate TR, mild AI.  Since  last clinic visit,  he reports he is doing well.  Denies any chest pain,  lightheadedness, syncope, lower extremity edema, or palpitations.  Reports chronic dyspnea, attributes to COPD and myasthenia.  No bleeding issues on warfarin.      Past Medical History:  Diagnosis Date   B12 deficiency    COPD (chronic obstructive pulmonary disease) ([place])    Essential tremor    Hypertension    Memory deficit    Myasthenia gravis ([place])    since 2004   PONV (postoperative nausea and vomiting)    Pulmonary emboli ([place])    in Nov 2003   Skin cancer     Past Surgical History:  Procedure Laterality Date   CARPAL TUNNEL RELEASE Right 09/14/2012   Procedure: CARPAL TUNNEL RELEASE;  Surgeon: Cammie Sickle., MD;  Location: [facility];  Service: Orthopedics;  Laterality: Right;   CARPAL TUNNEL RELEASE Left 11/04/2015   Procedure: LEFT CARPAL TUNNEL RELEASE, EXTENDED INCISION;  Surgeon: Daryll Brod, MD;  Location: [facility];  Service: Orthopedics;  Laterality: Left;  ANESTHESIA: IV REGIONAL UPPER ARM   CATARACT EXTRACTION Right    CATARACT EXTRACTION W/PHACO Left 03/12/2021   Procedure: CATARACT EXTRACTION PHACO AND INTRAOCULAR LENS PLACEMENT (IOC);  Surgeon: Baruch Goldmann, MD;  Location: [facility];  Service: Ophthalmology;  Laterality: Left;  CDE-42.07   CHOLECYSTECTOMY  2012   [place]   COLONOSCOPY     FOOT SURGERY  1966   lt orif foot   SKIN CANCER EXCISION      Current Medications: Current Meds  Medication Sig   fluorouracil (EFUDEX) 5 % cream Apply topically.   ketoconazole (NIZORAL) 2 % shampoo ketoconazole 2 % shampoo   metroNIDAZOLE (METROGEL) 1 % gel Apply 1 application topically daily.   pyridostigmine (MESTINON) 60 MG tablet TAKE 1 TABLET IN THE MORNING  , AT NOON  , IN THE EVENING  AND AT BEDTIME   trimethoprim-polymyxin b (POLYTRIM) ophthalmic solution polymyxin B sulfate 10,000 unit-trimethoprim 1 mg/mL eye drops   valsartan-hydrochlorothiazide  (DIOVAN-HCT) 320-25 MG per tablet Take 0.5 tablets by mouth daily.   warfarin (COUMADIN) 5 MG tablet Take 5-7.5 mg by mouth. Take 7.'5mg'$  by mouth on Tuesday and Thursday and take 5 mg on Sunday, Monday, Wednesday, Friday and Saturday.     Allergies:   Aspirin, Shellfish allergy, and Tramadol   Social History   Socioeconomic History   Marital status: Married    Spouse name: [name]   Number of children: 2   Years of education: 12   Highest education level: Not on file  Occupational History   Occupation: Plant    Comment: Retired  Tobacco Use   Smoking status: Former    Packs/day: 0.25    Years: 10.00    Total pack years: 2.50    Types: Cigarettes    Quit date: 05/17/1968    Years since quitting: 53.7   Smokeless tobacco: Never  Vaping Use   Vaping Use: Never used  Substance and Sexual Activity   Alcohol use: No   Drug use: No   Sexual activity: Yes  Other Topics Concern   Not on file  Social History Narrative   He lives with his wife [name](, has 2 children, retired.    Hard of hearing.   Right handed.   Caffeine- None   Social Determinants of Health   Financial Resource Strain: Not on file  Food Insecurity: Not on file  Transportation Needs: Not on file  Physical Activity: Not on file  Stress: Not on file  Social Connections: Not on file     Family History: The patient's family history includes Heart disease in his father and mother.  ROS:   Please see the history of present illness.     All other systems reviewed and are negative.  EKGs/Labs/Other Studies Reviewed:    The following studies were reviewed today:   EKG:   10/26/21: Normal sinus rhythm, rate 61, right bundle branch block, left anterior fascicular block, LVH  Recent Labs: 09/17/2021: TSH 1.980  Recent Lipid Panel No results found for: "CHOL", "TRIG", "HDL", "CHOLHDL", "VLDL", "LDLCALC", "LDLDIRECT"  Physical Exam:    VS:  BP (!) 148/60   Pulse (!) 52   Ht '5\' 9"'$  (1.753 m)   Wt 207 lb 9.6  oz (94.2 kg)   SpO2 96%   BMI 30.66 kg/m     Wt Readings from Last 3 Encounters:  01/27/22 207 lb 9.6 oz (94.2 kg)  10/26/21 211 lb 12.8 oz (96.1 kg)  09/17/21 207 lb 8 oz (94.1 kg)     GEN:   in no acute distress HEENT: Normal NECK: No JVD; No carotid bruits LYMPHATICS: No lymphadenopathy CARDIAC: RRR, no murmurs, rubs, gallops RESPIRATORY:  Clear to auscultation without rales, wheezing or rhonchi  ABDOMEN: Soft, non-tender, non-distended MUSCULOSKELETAL:  No edema; No deformity  SKIN: Warm and dry NEUROLOGIC:  Alert and oriented x 3 PSYCHIATRIC:  Normal affect  ASSESSMENT:    1. Syncope and collapse   2. Pulmonary hypertension, unspecified ([place])   3. Essential hypertension     PLAN:    Syncope:  admitted at [facility] in April 2023 with syncope.  Head CT showed no acute intracranial findings.  Echocardiogram 09/11/2021 showed EF 60 to 65%, mild MR, mild AI, normal RV function, moderate TR, moderate PAH, small PFO.  Carotid duplex showed minor atherosclerosis but no significant stenosis. -Zio patch x14 days on 11/19/2021 showed 1 episode of NSVT lasting 5 beats, 14 episodes of SVT with longest lasting 11 beats. -Carotid duplex -Recheck echocardiogram  Pulmonary hypertension: Reportedly moderate PAH on echocardiogram 08/2021 at [facility].  Echocardiogram 02/01/2022 showed EF 65 to [id number], grade 2 diastolic dysfunction, normal RV function, mild RV enlargement, moderately elevated RVSP 47 mmHg, mild left atrial enlargement, moderate right atrial enlargement, moderate TR, mild AI.  Hypertension: Continue valsartan-HCTZ 160-12.5 mg daily.    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  Concerns regarding medicines are outlined above.  Orders Placed This Encounter  Procedures   ECHOCARDIOGRAM COMPLETE   VAS US CAROTID   No orders of the defined types were placed in this encounter.   Patient Instructions  Medication Instructions:  Your physician recommends that you continue on your current medications as directed. Please refer to the Current Medication list given to you today.  *If you need a refill on your cardiac medications before your next appointment, please call your pharmacy*  Testing/Procedures: Your physician has requested that you have an echocardiogram. Echocardiography is a painless test that uses sound waves to create images of your heart. It provides your doctor with information about the size and shape of your heart and how well your heart's chambers and valves are working. This procedure takes approximately one hour. There are no restrictions for this procedure.  Your physician has requested that you have a carotid duplex. This test is an ultrasound of the carotid arteries in your neck. It looks at blood flow through these arteries that supply the brain with blood. Allow one hour for this exam. There are no restrictions or special instructions.  Follow-Up: At [facility], you and your health needs are our priority.  As part of our continuing mission to provide you with exceptional heart care, we have created designated Provider Care Teams.  These Care Teams include your primary Cardiologist (physician) and Advanced Practice Providers (APPs -  Physician Assistants and Nurse Practitioners) who all work together to provide you with the care you need, when you need it.  We recommend signing up for the patient portal called "MyChart".  Sign up information is provided on this After Visit Summary.  MyChart is used to connect with patients for Virtual Visits (Telemedicine).  Patients are able to view lab/test  results, encounter notes, upcoming appointments, etc.  Non-urgent messages can be sent to your provider as well.   To learn more about what you can do with MyChart, go to [email or web address].    Your next appointment:   6 month(s)  The format for your next appointment:   In Person  Provider:   Dr. Gardiner Rhyme      Signed, Harrell Gave  Fritz Pickerel, MD  01/27/2022 11:53 AM    [facility]

## 2022-07-27 ENCOUNTER — Ambulatory Visit: Payer: Medicare Other | Attending: Cardiology | Admitting: Cardiology

## 2022-07-27 DIAGNOSIS — C4441 Basal cell carcinoma of skin of scalp and neck: Secondary | ICD-10-CM | POA: Diagnosis not present

## 2022-07-27 DIAGNOSIS — C44329 Squamous cell carcinoma of skin of other parts of face: Secondary | ICD-10-CM | POA: Diagnosis not present

## 2022-07-27 DIAGNOSIS — D485 Neoplasm of uncertain behavior of skin: Secondary | ICD-10-CM | POA: Diagnosis not present

## 2022-07-30 DIAGNOSIS — R030 Elevated blood-pressure reading, without diagnosis of hypertension: Secondary | ICD-10-CM | POA: Diagnosis not present

## 2022-07-30 DIAGNOSIS — I4891 Unspecified atrial fibrillation: Secondary | ICD-10-CM | POA: Diagnosis not present

## 2022-07-30 DIAGNOSIS — Z86711 Personal history of pulmonary embolism: Secondary | ICD-10-CM | POA: Diagnosis not present

## 2022-08-02 DIAGNOSIS — M1732 Unilateral post-traumatic osteoarthritis, left knee: Secondary | ICD-10-CM | POA: Diagnosis not present

## 2022-08-12 DIAGNOSIS — I4891 Unspecified atrial fibrillation: Secondary | ICD-10-CM | POA: Diagnosis not present

## 2022-08-12 DIAGNOSIS — R030 Elevated blood-pressure reading, without diagnosis of hypertension: Secondary | ICD-10-CM | POA: Diagnosis not present

## 2022-08-18 DIAGNOSIS — G7001 Myasthenia gravis with (acute) exacerbation: Secondary | ICD-10-CM | POA: Diagnosis not present

## 2022-08-19 DIAGNOSIS — G7001 Myasthenia gravis with (acute) exacerbation: Secondary | ICD-10-CM | POA: Diagnosis not present

## 2022-08-20 DIAGNOSIS — H0288B Meibomian gland dysfunction left eye, upper and lower eyelids: Secondary | ICD-10-CM | POA: Diagnosis not present

## 2022-08-20 DIAGNOSIS — H0288A Meibomian gland dysfunction right eye, upper and lower eyelids: Secondary | ICD-10-CM | POA: Diagnosis not present

## 2022-08-20 DIAGNOSIS — H02202 Unspecified lagophthalmos right lower eyelid: Secondary | ICD-10-CM | POA: Diagnosis not present

## 2022-08-20 DIAGNOSIS — H26493 Other secondary cataract, bilateral: Secondary | ICD-10-CM | POA: Diagnosis not present

## 2022-08-20 DIAGNOSIS — H02205 Unspecified lagophthalmos left lower eyelid: Secondary | ICD-10-CM | POA: Diagnosis not present

## 2022-08-20 DIAGNOSIS — Z961 Presence of intraocular lens: Secondary | ICD-10-CM | POA: Diagnosis not present

## 2022-08-26 DIAGNOSIS — H01001 Unspecified blepharitis right upper eyelid: Secondary | ICD-10-CM | POA: Diagnosis not present

## 2022-08-26 DIAGNOSIS — H01002 Unspecified blepharitis right lower eyelid: Secondary | ICD-10-CM | POA: Diagnosis not present

## 2022-08-26 DIAGNOSIS — H01005 Unspecified blepharitis left lower eyelid: Secondary | ICD-10-CM | POA: Diagnosis not present

## 2022-08-26 DIAGNOSIS — H01004 Unspecified blepharitis left upper eyelid: Secondary | ICD-10-CM | POA: Diagnosis not present

## 2022-09-03 DIAGNOSIS — L820 Inflamed seborrheic keratosis: Secondary | ICD-10-CM | POA: Diagnosis not present

## 2022-09-03 DIAGNOSIS — D485 Neoplasm of uncertain behavior of skin: Secondary | ICD-10-CM | POA: Diagnosis not present

## 2022-09-03 DIAGNOSIS — C44329 Squamous cell carcinoma of skin of other parts of face: Secondary | ICD-10-CM | POA: Diagnosis not present

## 2022-09-09 DIAGNOSIS — H01001 Unspecified blepharitis right upper eyelid: Secondary | ICD-10-CM | POA: Diagnosis not present

## 2022-09-09 DIAGNOSIS — H01002 Unspecified blepharitis right lower eyelid: Secondary | ICD-10-CM | POA: Diagnosis not present

## 2022-09-09 DIAGNOSIS — H01004 Unspecified blepharitis left upper eyelid: Secondary | ICD-10-CM | POA: Diagnosis not present

## 2022-09-09 DIAGNOSIS — H01005 Unspecified blepharitis left lower eyelid: Secondary | ICD-10-CM | POA: Diagnosis not present

## 2022-09-10 DIAGNOSIS — Z86711 Personal history of pulmonary embolism: Secondary | ICD-10-CM | POA: Diagnosis not present

## 2022-09-10 DIAGNOSIS — R030 Elevated blood-pressure reading, without diagnosis of hypertension: Secondary | ICD-10-CM | POA: Diagnosis not present

## 2022-09-10 DIAGNOSIS — I4891 Unspecified atrial fibrillation: Secondary | ICD-10-CM | POA: Diagnosis not present

## 2022-09-15 DIAGNOSIS — G7001 Myasthenia gravis with (acute) exacerbation: Secondary | ICD-10-CM | POA: Diagnosis not present

## 2022-09-16 DIAGNOSIS — G7001 Myasthenia gravis with (acute) exacerbation: Secondary | ICD-10-CM | POA: Diagnosis not present

## 2022-09-23 ENCOUNTER — Encounter: Payer: Self-pay | Admitting: Neurology

## 2022-09-23 ENCOUNTER — Ambulatory Visit (INDEPENDENT_AMBULATORY_CARE_PROVIDER_SITE_OTHER): Payer: Medicare Other | Admitting: Neurology

## 2022-09-23 VITALS — BP 144/68 | HR 65 | Ht 69.0 in | Wt 210.5 lb

## 2022-09-23 DIAGNOSIS — Z86711 Personal history of pulmonary embolism: Secondary | ICD-10-CM | POA: Diagnosis not present

## 2022-09-23 DIAGNOSIS — G7001 Myasthenia gravis with (acute) exacerbation: Secondary | ICD-10-CM

## 2022-09-23 DIAGNOSIS — R1310 Dysphagia, unspecified: Secondary | ICD-10-CM

## 2022-09-23 DIAGNOSIS — R0602 Shortness of breath: Secondary | ICD-10-CM

## 2022-09-23 NOTE — Progress Notes (Signed)
ASSESSMENT AND PLAN 89 y.o. year old male   Seropositive myasthenia gravis with exacerbation, Following his COVID diagnosis in April 2022, increased difficulty with swallowing, general weakness Continued complaints of weakness following 6 days of Medrol pack, higher dose of Mestinon, restarted on IVIG treatment, loading dose on Oct 01 2020, followed by 1 g/kg maintenance dose June 15, November 18, 2020, last infusion was in April 2023 Took a break from IVIG May-August 2023, restarted in timbre 2023 due to difficulty swallowing, continues to benefit from IVIG, wishes to continue,   Continue IVIG 1 g/kg every 4 weeks. 50 g x 2 days Continue Mestinon 60 mg 4 times daily  Not a good candidate for long-term immunosuppressive treatment, due to previous history of extensive skin cancer, could not tolerate CellCept, Imuran  Lab evaluation today  Shortness of breath  Baseline intermittent shortness of breath with exertion, this has been a problem since his PE in 2003, on chronic Coumadin treatment  He also has mild persistent bilateral eye closure, cheek puff muscle weakness due to multiple facial skin surgery, blepharoplasty  Return To Clinic With NP In 6 Months  DIAGNOSTIC DATA (LABS, IMAGING, TESTING) - I reviewed patient records, labs, notes, testing and imaging myself    Labs in May 2023 low normal B 12 259, TSH 1.980, CK 209  EEG was normal  MRI of the brain showed no acute abnormality.  There was evidence of chronic left cerebellar infarction, moderate supratentorium small vessel disease.  HISTORY OF PRESENT ILLNESS:  MD. Calvin Sloan  is a 34 -year-old right-handed white married male with generalized seropositive myasthenia gravis characterized by bulbar weakness, shortness of breath , and dysphagia. Patient of Dr. Sandria Manly   He was diagnosed in 10/2002 with a  positive Tensilon test, positive acetylchoine receptor antibody,and had a positive ANA at 1-160 speckled pattern. Initially he had right  eye ptosis and subsequently, double vision, fatigue with chewing, dysphagia, and upper and lower extremity weakness. He was initially treated with Mestinon bromide in June 2004,  which was associated with GI side effects.   Prednisone was started in 03/29/2003 and CellCept 05/2003. He developed increased jitteriness, rash, and cramps on CellCept which was discontinued 10/2003. He had progressive bulbar weakness and was admitted 12/2003 for IV IgG therapy which required a PEG tube for dysphagia transiently. He was started on Imuran. 01/17/2004 he was readmitted for 2 days of IV IgG because of shortness of breath. He has not been hospitalized since 01/2004.    He has been slowly tapered off prednisone 09/2007 and was on Imuran and Mestinon bromide.    He had B12 deficiency and was receiving B12 shots ,but  B12 levels off injections have been normal and B12 shots were discontinued.   He has  a history of vague visual loss on the left of unknown etiology. He denies swallowing problems, speech problems, voice changes, focal weakness, or double vision.   He is independent in activities of daily living and takes one nap per day.He denies memory loss but his wife states that he does have memory loss. His last blood studies in my office 05/27/10 were normal except a low white blood cell count of 3900.   His dermatologist Dr. Herma Mering was concerned that the Imuran is associated with increased risk of skin cancer and the imuran was discontinued in 12/30/2010. His skin cancer (basal cell cancer) has improved since his imuran was stopped.   He has noted no change In myasthenic symptoms. His  skin has improved. His medication for myasthenia is mesinon bromide 60  milligrams one and one half tablets 4 times a day. He has shortness of breath since his pulmonary emboli in 2003, also complains of excessive fatigue.    He is allergic to aspirin, GI side effect, gastric ulcer,  and is on Plavix for a history of TIAs with  right arm weakness He has  swallowing problems at the beginning of the meal with liquids that improves as the meal continues.     He had right CTS release in May 2014 by Dr. Teressa Senter.   He complains of shortness of breath with walking, bending over to tire his shoed, due to PE 2003, pulmnolgist folllowed up, he does not like to chew on tough beef, he denies swallowing difficulty, no double vision, no droopy eyelid, no significant bleeding muscle weakness. He is active in yard, shop, threadmill     UPDATE Jan 16th 2015: He is now tapering off Mestinon, there was no significant change in his functional status, he denies double vision, no ptosis, continue complains of shortness of breath with exertion no significant and muscle weakness, he has to wash down his food sometimes, but no significant swelling, or chewing difficulty, no dysarthria   UPDATE July 16th 2015: He is no longer mestinon, he does complains of increased breathing difficulty from prolonged walking, no chewing difficulty, no double vision, no ptosis.   Laboratory continue to demonstrate positive acetylcholine binding, and modulating antibodies.  He did reported that his skin cancer has improved after stopping imuran, could not tolerate cellcept per record.  Previously he responded to IVIG very well.   UPDATE Sept 25th 2015: He was stated on prednisone 10mg  qday, and mestinon 60mg  tid in June 2015, which did help his fatigue, shortness of breath, wife reported 50% improvement, he has increased appetite   But he was admitted to [facility] in Sept 17th for bilateral PEs, right leg DVT, he is started on coumadin, bridge with Lovenox, Dr. Dimas Aguas is monitoring his INR   He denies double vision, no ptosis, no limb muscle weakness, no dysphagia   UPDATE April 4th 2016: He is on coumadin for PE, INR was monitored by Dr. Dimas Aguas, his myasthenia gravis is under good control, is taking prednisone 10 mg every day, he has surgery in Nov  2015 for right leg cellulitis.   He only does little around his house, he has no double vision, SOB due to previous PE,    UPDATE Feb 18 2015: Previous laboratory evaluation showed mild B12 deficiency with level of 204, He did receive IM B12 supplement at his primary care Dr. Jeannette How office for a while, now with normal repeat laboratory evaluation, injection has stopped He has mild choking difficulty, this has been going on since August 2016, not getting worse,   he has more trouble with liquid. He complains of shortness of breath when bending over. He denies significant gait difficulty, no breathing difficulty otherwise, he denies double vision, no ptosis,, No chewing difficulties     UPDATE May 22 2015: He came in last visit April 16 2015 complains of worsening blurry vision, double vision, swallowing difficulties, trouble talking, he reported 50% weakness compared to baseline, difficulty getting up from seated position, difficulty chewing, worsening shortness of breath, on examination, he was noted to have increased weakness compared to previous examination, he had moderate eye-closure cheek puff, mild neck flexion weakness, mild bilateral shoulder abduction bilateral hip flexion weakness.   He was  treated with IVIG 400 mg/kg every day for 5 days from December 11 to May 02 2015, his vision has much improved, he can walk better, but he continue have swallowing difficulty, dysarthria, he is also taking prednisone 40 mg every day, increase his Mestinon 60 mg to 3-4 tablets daily   He was not able to tolerate immunosuppression treatment in the past due to skin cancer   UPDATE Jul 07 2015:   I have called his wife, pulmonary function test in June 05 2015.   1, FVC is 3.1 5, 87%, this is normal 2. FEV1 is 2.1 6, 77%, with a percent FEV1 of 68, this indicated mild to moderate primary small  obstruction in this patient was a history of cigarette smoking, large elderly flow rates are generally  normal   3. Flow volume loop is unremarkable 4, lung volumes indicate some evidence of air trapping with an increased residual volume, the TLC is normal, the increased residual volume confirms the presence of airflow obstruction   5, DLCO is 19.0, 90%, this is normal 6, elderly resistant 3.7 2, 265%, this is increased   Wife reported, he recently had swallowing study, there was no significant abnormality found, his swallowing, breathing, symptoms overall has improved   He did think that IVIG treatment in Dec 11-16th 2016 has helped his blurry vision, it is gone, he still feel straggled sometimes, he has shortness with exertion and bending over. His talking is much better.   UPDATE May 22nd 2017: He continue complains of shortness of breath with minimum exertion, no diplopia, no double vision, mild dysphasia,   He had a history of right carpal tunnel release surgery in the past, which has been helpful, now complains of left first 3 finger paresthesia, EMG nerve conduction studies was planned at local hospital   UPDATE July 6th 2017: He stopped taking Mestinon around May 20 third 2017, shortly afterwards, he noticed double vision, he clearly describe eighth of binocular double vision, sometimes vertigo, sometimes horizontal, he went on higher dose of Mestinon 60 mg 4 times a day without improving his symptoms, he slept on a bed with 30 head raise, with no significant breathing difficulty, he could not eat his dinner on July 5th 2017 due to chewing swallowing difficulty, he could not fly flat sleeping, mildly unsteady gait, increased fatigue,   UPDATE August 17th 2017: Last IVIG treatment was December 05 2015, he did very well for a while, was able to cut grass, trim his lawn, it did helped him for at least 2 weeks, then he had passing out    I reviewed laboratory evaluation in August 2017, normal B12, ferritin level, iron binding capacity was within normal limit, low hemoglobin 10 point 8,  creatinine was elevated 1.53, glucose was 125   Previous laboratory evaluation in 2016 showed normal folic acid 12 point 5,low normal B12 level 266, hemoglobin in January 2015 was 14 point 3   He will be evaluated by hematologist/oncologist Dr. Vanice Sarah in September 6th 2017, at [facility]   He complains of low back pain since he fell you August 1st 2017, has been taking intermittent Tylenol,   He complains of difficulty swallowing, both liquid, and solid food, no double vision, He is taking mestinon 60mg  4 times a day, which did help him.   Update January 29 2016: I have reviewed his hematologist Dr. Clyda Greener note on September 6th 2017, normocytic microchromic anemia, normal WBC, platelet count, likely from chronic kidney disease, history  of fetal deficiency, is on B12 supplement, there was a consideration of low level hemolytic anemia from IVIG infusion, no overt evidence of blood loss   I reviewed laboratory evaluation in September 2017, parietal cell antibody IgG was negative, intrinsic factor negative, mild elevated kappa free light chain, Hg 13.8, creat 1.38, GFR 47, ferritin 163, normal TSh 1.5, elevated vitamin B12 1480   He complains more fatigue, more swallowing difficulty, decreased po intake, he could not drinking water because worry about the choking episode, increased shortness of breath with exertion, intermittent blurry version, difficulty closing his eyes.   He is now taking mestinon 60mg  qid, no significant side effect, was not sure about the benefit.   Also reviewed swallowing evaluation on June 06 2015 at [facility], there was mild oropharyngeal dysphagia, characterized by spillover to the valleculae and the piriform sinus secondary to reduced oral motor coordination    patient was felt to safe to consume a regular diet with a full range of liquid with  recommendation of upright for food by mouth, and 45 minutes after meals, head flexed slightly  forward , swallow at slower rate   UPDATE Oct 16th 2017: I reviewed laboratory in October 2017, immune of fixative electrophoresis showed mild elevated Ig G2.8, otherwise was normal, mild anemia hemoglobin 11.7, mild elevated kappa.free light chain 26, mild elevated creatinine 1.38, normal ferritin 163, normal iron panel, TSH, folic acid, B12 1480,   Last ivig was in Sept, it has helped him 80%,  he can chew and swallow, he continue complains of blurry vision,   UPDATE May 05 2016: He is overall much improved, but still complains of SOB, is at his baseline, last infusion was on Nov 27, 28th, he will have more infusion in December, he has mild dysarthria, slight dysphagia will see hematologist Dr. Galen Manila in January 2018,    UPDATE Oct 04 2018: Patient complains of gradual onset swallowing difficulty since April 2020, to the point he has to modify his daily diet, he can only eat soft food, also increased dyspnea upon exertion, has to cut back his daily activity, he denies double vision, no droopy eyelid, denies difficulty to close his eyes tightly, he denies significant limb muscle weakness,   Update December 13, 2018: He is accompanied by his wife at today's clinic visit, he had received IVIG treatment in June, July, planning on receiving it again on August 3 to 10/2018, he tolerated infusion well, noticed moderate to significant improvement of his muscle weakness, 80% back to his baseline, he can swallowing better, move better, but continue noticed mild bilateral upper extremity weakness, taking Mestinon 4 times a day, prednisone 10 mg daily   UPDATE July 16 2019: He had IVIG treatment in June, July, August, September 2020, after that, he was able to bounce back to baseline, gradually tapered off prednisone treatment,   He is still taking Mestinon 60 mg 4 times a day, complains of shortness of breath occasionally denied double vision, swallowing difficulty  UPDATE Mar 19 2020: Because of the  increased swallowing difficulty, double vision, he received IVIG treatment loading dose 2 g/kg in September, maintenance dose in October, he reported significant improvement, now close to baseline, gained 7 pounds over the past few months, has third infusion planned in November  He only take Mestinon as needed, no longer taking prednisone,  UPDATE December 02 2020:  Following his COVID infection on August 28, 2020, presented with cough, stomachache, increased difficulty breathing, also increased swallowing, dysarthria,  despite taking higher dose of Mestinon 1.5 mg 3 times a day, also complains of generalized weakness, decreased mobility  He was noted to have mild neck flexion, proximal upper lower extremity muscle weakness at last visit on Sep 16, 2020 with several, we decided to proceed with IVIG,  Loading dose 2 g/kg on May 18, followed by maintenance dose 1 g/kg on June 15, November 18, 2020  Now he feels much better, back to his baseline, still has frequent cough, mild shortness of breath, on residual deficit of his PE, no longer has swallowing difficulty, getting stronger, no significant gait abnormality,   UPDATE December 29 2020: He was doing so well last visit on December 02, 2020, we decided to hold off IVIG maintenance dose 1 g/kg, he reported a week history of gradually worsening swallowing chewing difficulty, despite taking Mestinon up to 60 mg 3 times a day, he described difficulty to swallow down food, has to wash down with drinks, induced cough sometimes also noticed mild increased slurred voice,  He had a history of PE, taking Coumadin, shortness of breath at baseline, no significant change, no significant gait abnormality, he denies double vision  Update April 21, 2021 He is accompanied by his wife at today's visit, tolerating IVIG 1 g/kg every 4 weeks, continue to require Mestinon 60 mg 4 times a day, still complains of frequent fatigue, difficulty getting up from seated position, mild  swallowing difficulty especially at the end of 3 weeks, difficulty closing eyes, frequent tearing, denies significant double vision  UPDATE Sep 17 2021: He is accompanied by his wife at today's clinical visit, his myasthenia gravis is overall under good control, has on continued IVIG infusions since July 2022, he denies swallowing difficulty, no double vision, baseline shortness of breath due to his previous PE, multiple facial skin cancer surgery, right eye plastic surgery due to difficulty closing his right eye, mild bilateral eye closure weakness   He had one passing out episode was treated at [facility] on September 10, 2021, he got up at his usual hours, finished breakfast, lab work on his lawnmower, he lied on the floor to take off the blade, sharp the blade in a standing position, limited by back on the floor to put it into place,  After that he went home, wife changed his skin dressing at the posterior ear, where he had recent skin cancer surgery, he complains of significant pain, during the process, he suddenly went limp, feel body jerks, unresponsive, wife called 911, he quickly came around  Now he is back to his baseline, he has no recollection of the event, no warning signs, deny a previous history of seizure  Telemetry monitoring showed no significant abnormality,  Laboratory evaluation on September 11 2021, hemoglobin was 12, elevated creatinine 1.55, albumin was 3.3, normal liver functional test, hypersensitivity troponin was elevated to 102, alcohol less than 3, lactate 1.1, Ultrasound of Doppler study showed mild carotid atherosclerosis, no significant stenosis, antegrade flow bilateral vertebral artery Echocardiogram showed ejection fraction 60 to 65%, moderate tricuspid regurgitation, moderate pulmonary hypertension  CT head without contrast showed atrophy, ventriculomegaly, periventricular white matter disease, remote left cerebellar infarction, Update March 25, 2022 SS:  Here today with his wife. IVIG was restarted September 2023 due to worsening swallowing ability following URI.  2 g/kg loading, divided into 4 days 50 g daily x 4 days/followed 1 g/kg every 4 weeks 50 g x 2 days.  He is doing better now with IVIG. No trouble  with swallowing now, chews well. No change in weakness. Still works in his yard, getting leaves up. Stopped IVIG May-August.  Remains on Mestinon.  No further seizure-like spells.  Wishes to remain on IVIG.  UPDATE Sep 23 2022: He is accompanied by his wife at today's clinical visit, continue have mild swallowing difficulty, especially at the end of last IVIG infusion cycle, which he is getting every 4 weeks, tolerating infusion well, previous multiple attempts to stop the infusion always trigger recurrent symptoms after few months, doing much better with every 4 weeks 1 g/kg infusion,  PHYSICAL EXAM  Vitals:   12/02/20 1255  BP: (!) 149/60  Pulse: (!) 59  Weight: 209 lb 8 oz (95 kg)  Height: 5\' 9"  (1.753 m)   Body mass index is 30.94 kg/m.  Physical Exam  General: The patient is alert and cooperative at the time of the examination.  Very nice, pleasant  Skin: No significant peripheral edema is noted. Redness to cheeks noted.   Neurologic Exam  Mental status: The patient is alert and oriented x 3 at the time of the examination. The patient has apparent normal recent and remote memory, with an apparently normal attention span and concentration ability.  Voice is soft, slightly hoarse.  Hard of hearing.  Cranial nerves: Facial symmetry is present. Extraocular movements are full. Visual fields are full.  Mild to moderate eye open weakness, mild cheek puff weakness.    Motor: The patient has good strength in all 4 extremities.  No neck weakness was noted.  Sensory examination: Soft touch sensation is symmetric on the face, arms, and legs.  Coordination: The patient has good finger-nose-finger and heel-to-shin bilaterally.  Gait and  station: Need push-up to get up from seated position, cautious Reflexes: Deep tendon reflexes are symmetric but decreased   REVIEW OF SYSTEMS:  Out of a complete 14 system review of symptoms, the patient complains only of the following symptoms, and all other reviewed systems are negative.  See HPI  ALLERGIES: Allergies  Allergen Reactions   Aspirin Nausea Only    Other Reaction(s): Not available   Shellfish Allergy Swelling    Other Reaction(s): Not available   Tramadol Nausea Only and Rash    HOME MEDICATIONS: Outpatient Medications Prior to Visit  Medication Sig Dispense Refill   fluorouracil (EFUDEX) 5 % cream Apply topically.     ketoconazole (NIZORAL) 2 % shampoo ketoconazole 2 % shampoo     metroNIDAZOLE (METROGEL) 1 % gel Apply 1 application topically daily.     pyridostigmine (MESTINON) 60 MG tablet TAKE 1 TABLET IN THE MORNING  , AT NOON  , IN THE EVENING  AND AT BEDTIME 360 tablet 1   trimethoprim-polymyxin b (POLYTRIM) ophthalmic solution polymyxin B sulfate 10,000 unit-trimethoprim 1 mg/mL eye drops     valsartan-hydrochlorothiazide (DIOVAN-HCT) 320-25 MG per tablet Take 0.5 tablets by mouth daily.     warfarin (COUMADIN) 5 MG tablet Take 5 mg by mouth daily. Take 7.5mg  by mouth on Tuesday and Thursday and take 5 mg on Sunday, Monday, Wednesday, Friday and Saturday.     No facility-administered medications prior to visit.    PAST MEDICAL HISTORY: Past Medical History:  Diagnosis Date   B12 deficiency    COPD (chronic obstructive pulmonary disease) (HCC)    Essential tremor    Hypertension    Memory deficit    Myasthenia gravis (HCC)    since 2004   PONV (postoperative nausea and vomiting)    Pulmonary emboli (  HCC)    in Nov 2003   Skin cancer     PAST SURGICAL HISTORY: Past Surgical History:  Procedure Laterality Date   CARPAL TUNNEL RELEASE Right 09/14/2012   Procedure: CARPAL TUNNEL RELEASE;  Surgeon: Wyn Forster., MD;  Location: [facility];  Service: Orthopedics;  Laterality: Right;   CARPAL TUNNEL RELEASE Left 11/04/2015   Procedure: LEFT CARPAL TUNNEL RELEASE, EXTENDED INCISION;  Surgeon: Cindee Salt, MD;  Location: [facility];  Service: Orthopedics;  Laterality: Left;  ANESTHESIA: IV REGIONAL UPPER ARM   CATARACT EXTRACTION Right    CATARACT EXTRACTION W/PHACO Left 03/12/2021   Procedure: CATARACT EXTRACTION PHACO AND INTRAOCULAR LENS PLACEMENT (IOC);  Surgeon: Fabio Pierce, MD;  Location: [facility];  Service: Ophthalmology;  Laterality: Left;  CDE-42.07   CHOLECYSTECTOMY  2012   [facility]   COLONOSCOPY     FOOT SURGERY  1966   lt orif foot   SKIN CANCER EXCISION      FAMILY HISTORY: Family History  Problem Relation Age of Onset   Heart disease Mother    Heart disease Father     SOCIAL HISTORY: Social History   Socioeconomic History   Marital status: Married    Spouse name: Kathie Rhodes   Number of children: 2   Years of education: 12   Highest education level: Not on file  Occupational History   Occupation: Plant    Comment: Retired  Tobacco Use   Smoking status: Former    Packs/day: 0.25    Years: 10.00    Additional pack years: 0.00    Total pack years: 2.50    Types: Cigarettes    Quit date: 05/17/1968    Years since quitting: 54.3   Smokeless tobacco: Never  Vaping Use   Vaping Use: Never used  Substance and Sexual Activity   Alcohol use: No   Drug use: No   Sexual activity: Yes  Other Topics Concern   Not on file  Social History Narrative   He lives with his wife Kathie Rhodes(, has 2 children, retired.    Hard of hearing.   Right handed.   Caffeine- None   Social Determinants of Health   Financial Resource Strain: Not on file  Food Insecurity: Not on file  Transportation Needs: Not on file  Physical Activity: Not on file  Stress: Not on file  Social Connections: Not on file  Intimate Partner Violence: Not on file    Margie Ege, Edrick Oh, DNP  [facility] [address] [place], [place] [address] [phone]

## 2022-09-24 LAB — CBC WITH DIFFERENTIAL/PLATELET
Basophils Absolute: 0.1 10*3/uL (ref 0.0–0.2)
Basos: 1 %
EOS (ABSOLUTE): 0 10*3/uL (ref 0.0–0.4)
Eos: 1 %
Hematocrit: 36.9 % — ABNORMAL LOW (ref 37.5–51.0)
Hemoglobin: 12.3 g/dL — ABNORMAL LOW (ref 13.0–17.7)
Immature Grans (Abs): 0 10*3/uL (ref 0.0–0.1)
Immature Granulocytes: 0 %
Lymphocytes Absolute: 0.9 10*3/uL (ref 0.7–3.1)
Lymphs: 23 %
MCH: 31.3 pg (ref 26.6–33.0)
MCHC: 33.3 g/dL (ref 31.5–35.7)
MCV: 94 fL (ref 79–97)
Monocytes Absolute: 0.4 10*3/uL (ref 0.1–0.9)
Monocytes: 9 %
Neutrophils Absolute: 2.7 10*3/uL (ref 1.4–7.0)
Neutrophils: 66 %
Platelets: 176 10*3/uL (ref 150–450)
RBC: 3.93 x10E6/uL — ABNORMAL LOW (ref 4.14–5.80)
RDW: 13.2 % (ref 11.6–15.4)
WBC: 4 10*3/uL (ref 3.4–10.8)

## 2022-09-24 LAB — COMPREHENSIVE METABOLIC PANEL
ALT: 18 IU/L (ref 0–44)
AST: 31 IU/L (ref 0–40)
Albumin/Globulin Ratio: 1.1 — ABNORMAL LOW (ref 1.2–2.2)
Albumin: 3.8 g/dL (ref 3.7–4.7)
Alkaline Phosphatase: 56 IU/L (ref 44–121)
BUN/Creatinine Ratio: 17 (ref 10–24)
BUN: 29 mg/dL — ABNORMAL HIGH (ref 8–27)
Bilirubin Total: 0.7 mg/dL (ref 0.0–1.2)
CO2: 25 mmol/L (ref 20–29)
Calcium: 9.5 mg/dL (ref 8.6–10.2)
Chloride: 105 mmol/L (ref 96–106)
Creatinine, Ser: 1.7 mg/dL — ABNORMAL HIGH (ref 0.76–1.27)
Globulin, Total: 3.6 g/dL (ref 1.5–4.5)
Glucose: 82 mg/dL (ref 70–99)
Potassium: 4.4 mmol/L (ref 3.5–5.2)
Sodium: 140 mmol/L (ref 134–144)
Total Protein: 7.4 g/dL (ref 6.0–8.5)
eGFR: 39 mL/min/{1.73_m2} — ABNORMAL LOW (ref >59)

## 2022-09-24 LAB — TSH: TSH: 2.11 u[IU]/mL (ref 0.450–4.500)

## 2022-09-28 ENCOUNTER — Telehealth: Payer: Self-pay

## 2022-09-28 NOTE — Telephone Encounter (Signed)
LEFT MSG TO CALL BACK TO OBTAIN RESULTS

## 2022-09-28 NOTE — Telephone Encounter (Signed)
-----   Message from Levert Feinstein, MD sent at 09/28/2022  3:16 PM EDT ----- Please call patient, laboratory evaluation showed:  --- Mild anemia hemoglobin of 12.3, mild decrease from his baseline level.  --- Abnormal kidney function, creatinine of 1.70, EGFR of 39, this is about his baseline level.  --- I have forwarded the lab result to his primary care Practice, [facility], he may continue follow-up with them

## 2022-10-08 DIAGNOSIS — I4891 Unspecified atrial fibrillation: Secondary | ICD-10-CM | POA: Diagnosis not present

## 2022-10-08 DIAGNOSIS — R030 Elevated blood-pressure reading, without diagnosis of hypertension: Secondary | ICD-10-CM | POA: Diagnosis not present

## 2022-10-13 DIAGNOSIS — G7001 Myasthenia gravis with (acute) exacerbation: Secondary | ICD-10-CM | POA: Diagnosis not present

## 2022-10-14 DIAGNOSIS — G7001 Myasthenia gravis with (acute) exacerbation: Secondary | ICD-10-CM | POA: Diagnosis not present

## 2022-10-21 DIAGNOSIS — C44329 Squamous cell carcinoma of skin of other parts of face: Secondary | ICD-10-CM | POA: Diagnosis not present

## 2022-11-02 DIAGNOSIS — M1732 Unilateral post-traumatic osteoarthritis, left knee: Secondary | ICD-10-CM | POA: Diagnosis not present

## 2022-11-05 DIAGNOSIS — R030 Elevated blood-pressure reading, without diagnosis of hypertension: Secondary | ICD-10-CM | POA: Diagnosis not present

## 2022-11-05 DIAGNOSIS — I4891 Unspecified atrial fibrillation: Secondary | ICD-10-CM | POA: Diagnosis not present

## 2022-11-08 ENCOUNTER — Other Ambulatory Visit: Payer: Self-pay | Admitting: Neurology

## 2022-11-10 DIAGNOSIS — G7001 Myasthenia gravis with (acute) exacerbation: Secondary | ICD-10-CM | POA: Diagnosis not present

## 2022-11-11 DIAGNOSIS — G7001 Myasthenia gravis with (acute) exacerbation: Secondary | ICD-10-CM | POA: Diagnosis not present

## 2022-11-25 DIAGNOSIS — E559 Vitamin D deficiency, unspecified: Secondary | ICD-10-CM | POA: Diagnosis not present

## 2022-11-25 DIAGNOSIS — Z13220 Encounter for screening for lipoid disorders: Secondary | ICD-10-CM | POA: Diagnosis not present

## 2022-11-25 DIAGNOSIS — N189 Chronic kidney disease, unspecified: Secondary | ICD-10-CM | POA: Diagnosis not present

## 2022-11-25 DIAGNOSIS — Z0001 Encounter for general adult medical examination with abnormal findings: Secondary | ICD-10-CM | POA: Diagnosis not present

## 2022-11-25 DIAGNOSIS — Z131 Encounter for screening for diabetes mellitus: Secondary | ICD-10-CM | POA: Diagnosis not present

## 2022-11-25 DIAGNOSIS — I10 Essential (primary) hypertension: Secondary | ICD-10-CM | POA: Diagnosis not present

## 2022-12-02 DIAGNOSIS — R030 Elevated blood-pressure reading, without diagnosis of hypertension: Secondary | ICD-10-CM | POA: Diagnosis not present

## 2022-12-02 DIAGNOSIS — R7303 Prediabetes: Secondary | ICD-10-CM | POA: Diagnosis not present

## 2022-12-02 DIAGNOSIS — Z86711 Personal history of pulmonary embolism: Secondary | ICD-10-CM | POA: Diagnosis not present

## 2022-12-02 DIAGNOSIS — M79671 Pain in right foot: Secondary | ICD-10-CM | POA: Diagnosis not present

## 2022-12-02 DIAGNOSIS — R55 Syncope and collapse: Secondary | ICD-10-CM | POA: Diagnosis not present

## 2022-12-02 DIAGNOSIS — G7000 Myasthenia gravis without (acute) exacerbation: Secondary | ICD-10-CM | POA: Diagnosis not present

## 2022-12-02 DIAGNOSIS — E559 Vitamin D deficiency, unspecified: Secondary | ICD-10-CM | POA: Diagnosis not present

## 2022-12-02 DIAGNOSIS — Z6831 Body mass index (BMI) 31.0-31.9, adult: Secondary | ICD-10-CM | POA: Diagnosis not present

## 2022-12-02 DIAGNOSIS — E875 Hyperkalemia: Secondary | ICD-10-CM | POA: Diagnosis not present

## 2022-12-02 DIAGNOSIS — M79672 Pain in left foot: Secondary | ICD-10-CM | POA: Diagnosis not present

## 2022-12-08 DIAGNOSIS — G7001 Myasthenia gravis with (acute) exacerbation: Secondary | ICD-10-CM | POA: Diagnosis not present

## 2022-12-09 DIAGNOSIS — G7001 Myasthenia gravis with (acute) exacerbation: Secondary | ICD-10-CM | POA: Diagnosis not present

## 2022-12-10 DIAGNOSIS — I4891 Unspecified atrial fibrillation: Secondary | ICD-10-CM | POA: Diagnosis not present

## 2022-12-10 DIAGNOSIS — R030 Elevated blood-pressure reading, without diagnosis of hypertension: Secondary | ICD-10-CM | POA: Diagnosis not present

## 2022-12-16 DIAGNOSIS — M79671 Pain in right foot: Secondary | ICD-10-CM | POA: Diagnosis not present

## 2022-12-16 DIAGNOSIS — M79672 Pain in left foot: Secondary | ICD-10-CM | POA: Diagnosis not present

## 2022-12-17 ENCOUNTER — Ambulatory Visit: Payer: Medicare Other | Admitting: Podiatry

## 2022-12-17 ENCOUNTER — Encounter: Payer: Self-pay | Admitting: Podiatry

## 2022-12-17 DIAGNOSIS — M79674 Pain in right toe(s): Secondary | ICD-10-CM | POA: Diagnosis not present

## 2022-12-17 DIAGNOSIS — G63 Polyneuropathy in diseases classified elsewhere: Secondary | ICD-10-CM

## 2022-12-17 DIAGNOSIS — B351 Tinea unguium: Secondary | ICD-10-CM

## 2022-12-17 DIAGNOSIS — M79675 Pain in left toe(s): Secondary | ICD-10-CM | POA: Diagnosis not present

## 2022-12-17 NOTE — Progress Notes (Signed)
  Subjective:  Patient ID: ONEILL BAIS, male    DOB: [birth year],  MRN: [id number]  Chief Complaint  Patient presents with   Nail Problem    "He's supposed to get his toenails clipped and I think Dr. Mayford Knife said he has a fungus." N - painful toenails L - 1-5 bilateral D - years O - gradually worse C - thick, hard A - none T - none D -     89 y.o. male presents with the above complaint. History confirmed with patient. Patient presenting with pain related to dystrophic thickened elongated nails. Patient is unable to trim own nails related to nail dystrophy and/or mobility issues. Patient does not have a history of T2DM.   Objective:  Physical Exam: warm, good capillary refill nail exam onychomycosis of the toenails, onycholysis, and dystrophic nails DP pulses palpable, PT pulses palpable, and protective sensation absent Left Foot:  Pain with palpation of nails due to elongation and dystrophic growth.  Right Foot: Pain with palpation of nails due to elongation and dystrophic growth.   Assessment:  No diagnosis found.   Plan:  Patient was evaluated and treated and all questions answered.  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 -Educated on self-care  Procedure: Nail Debridement Rationale: Pain Type of Debridement: manual, sharp debridement. Instrumentation: Nail nipper, rotary burr. Number of Nails: 10  Return in about 3 months (around 03/19/2023) for RFC.         Corinna Gab, DPM [facility] / [facility]

## 2022-12-27 DIAGNOSIS — E875 Hyperkalemia: Secondary | ICD-10-CM | POA: Diagnosis not present

## 2022-12-27 DIAGNOSIS — N189 Chronic kidney disease, unspecified: Secondary | ICD-10-CM | POA: Diagnosis not present

## 2023-01-03 DIAGNOSIS — Z86711 Personal history of pulmonary embolism: Secondary | ICD-10-CM | POA: Diagnosis not present

## 2023-01-03 DIAGNOSIS — E559 Vitamin D deficiency, unspecified: Secondary | ICD-10-CM | POA: Diagnosis not present

## 2023-01-03 DIAGNOSIS — E875 Hyperkalemia: Secondary | ICD-10-CM | POA: Diagnosis not present

## 2023-01-03 DIAGNOSIS — R030 Elevated blood-pressure reading, without diagnosis of hypertension: Secondary | ICD-10-CM | POA: Diagnosis not present

## 2023-01-03 DIAGNOSIS — R7303 Prediabetes: Secondary | ICD-10-CM | POA: Diagnosis not present

## 2023-01-03 DIAGNOSIS — R6889 Other general symptoms and signs: Secondary | ICD-10-CM | POA: Diagnosis not present

## 2023-01-03 DIAGNOSIS — G7000 Myasthenia gravis without (acute) exacerbation: Secondary | ICD-10-CM | POA: Diagnosis not present

## 2023-01-03 DIAGNOSIS — I4891 Unspecified atrial fibrillation: Secondary | ICD-10-CM | POA: Diagnosis not present

## 2023-01-03 DIAGNOSIS — M79671 Pain in right foot: Secondary | ICD-10-CM | POA: Diagnosis not present

## 2023-01-03 DIAGNOSIS — M79672 Pain in left foot: Secondary | ICD-10-CM | POA: Diagnosis not present

## 2023-01-03 DIAGNOSIS — Z6831 Body mass index (BMI) 31.0-31.9, adult: Secondary | ICD-10-CM | POA: Diagnosis not present

## 2023-01-03 DIAGNOSIS — R55 Syncope and collapse: Secondary | ICD-10-CM | POA: Diagnosis not present

## 2023-01-05 ENCOUNTER — Other Ambulatory Visit: Payer: Self-pay

## 2023-01-05 DIAGNOSIS — G7001 Myasthenia gravis with (acute) exacerbation: Secondary | ICD-10-CM

## 2023-01-06 DIAGNOSIS — G7001 Myasthenia gravis with (acute) exacerbation: Secondary | ICD-10-CM | POA: Diagnosis not present

## 2023-01-06 LAB — IGG, IGA, IGM
IgA/Immunoglobulin A, Serum: 169 mg/dL (ref 61–437)
IgG (Immunoglobin G), Serum: 1571 mg/dL (ref 603–1613)
IgM (Immunoglobulin M), Srm: 35 mg/dL (ref 15–143)

## 2023-01-20 ENCOUNTER — Other Ambulatory Visit (HOSPITAL_COMMUNITY): Payer: Self-pay | Admitting: Internal Medicine

## 2023-01-20 DIAGNOSIS — R6889 Other general symptoms and signs: Secondary | ICD-10-CM

## 2023-01-28 ENCOUNTER — Ambulatory Visit (HOSPITAL_COMMUNITY)
Admission: RE | Admit: 2023-01-28 | Discharge: 2023-01-28 | Disposition: A | Discharge status: Home / Self Care | Payer: Medicare Other | Source: Ambulatory Visit | Attending: Internal Medicine | Admitting: Internal Medicine

## 2023-01-28 DIAGNOSIS — R6889 Other general symptoms and signs: Secondary | ICD-10-CM | POA: Insufficient documentation

## 2023-01-28 DIAGNOSIS — M25562 Pain in left knee: Secondary | ICD-10-CM | POA: Diagnosis not present

## 2023-01-28 DIAGNOSIS — M25561 Pain in right knee: Secondary | ICD-10-CM | POA: Diagnosis not present

## 2023-01-31 DIAGNOSIS — I4891 Unspecified atrial fibrillation: Secondary | ICD-10-CM | POA: Diagnosis not present

## 2023-01-31 DIAGNOSIS — R030 Elevated blood-pressure reading, without diagnosis of hypertension: Secondary | ICD-10-CM | POA: Diagnosis not present

## 2023-02-01 DIAGNOSIS — M1732 Unilateral post-traumatic osteoarthritis, left knee: Secondary | ICD-10-CM | POA: Diagnosis not present

## 2023-02-02 DIAGNOSIS — G7001 Myasthenia gravis with (acute) exacerbation: Secondary | ICD-10-CM | POA: Diagnosis not present

## 2023-02-03 DIAGNOSIS — G7001 Myasthenia gravis with (acute) exacerbation: Secondary | ICD-10-CM | POA: Diagnosis not present

## 2023-02-08 ENCOUNTER — Other Ambulatory Visit (HOSPITAL_COMMUNITY): Payer: Self-pay | Admitting: Internal Medicine

## 2023-02-08 DIAGNOSIS — R6889 Other general symptoms and signs: Secondary | ICD-10-CM | POA: Diagnosis not present

## 2023-02-08 DIAGNOSIS — G7000 Myasthenia gravis without (acute) exacerbation: Secondary | ICD-10-CM | POA: Diagnosis not present

## 2023-02-08 DIAGNOSIS — E559 Vitamin D deficiency, unspecified: Secondary | ICD-10-CM | POA: Diagnosis not present

## 2023-02-08 DIAGNOSIS — M79672 Pain in left foot: Secondary | ICD-10-CM

## 2023-02-08 DIAGNOSIS — E875 Hyperkalemia: Secondary | ICD-10-CM | POA: Diagnosis not present

## 2023-02-08 DIAGNOSIS — M79671 Pain in right foot: Secondary | ICD-10-CM | POA: Diagnosis not present

## 2023-02-08 DIAGNOSIS — Z86711 Personal history of pulmonary embolism: Secondary | ICD-10-CM | POA: Diagnosis not present

## 2023-02-08 DIAGNOSIS — R55 Syncope and collapse: Secondary | ICD-10-CM | POA: Diagnosis not present

## 2023-02-08 DIAGNOSIS — R7303 Prediabetes: Secondary | ICD-10-CM | POA: Diagnosis not present

## 2023-02-08 DIAGNOSIS — Z6831 Body mass index (BMI) 31.0-31.9, adult: Secondary | ICD-10-CM | POA: Diagnosis not present

## 2023-02-09 ENCOUNTER — Other Ambulatory Visit (HOSPITAL_COMMUNITY): Payer: Self-pay | Admitting: Internal Medicine

## 2023-02-09 DIAGNOSIS — M79671 Pain in right foot: Secondary | ICD-10-CM

## 2023-02-21 DIAGNOSIS — C44319 Basal cell carcinoma of skin of other parts of face: Secondary | ICD-10-CM | POA: Diagnosis not present

## 2023-02-21 DIAGNOSIS — C44329 Squamous cell carcinoma of skin of other parts of face: Secondary | ICD-10-CM | POA: Diagnosis not present

## 2023-02-25 DIAGNOSIS — I4891 Unspecified atrial fibrillation: Secondary | ICD-10-CM | POA: Diagnosis not present

## 2023-02-25 DIAGNOSIS — R030 Elevated blood-pressure reading, without diagnosis of hypertension: Secondary | ICD-10-CM | POA: Diagnosis not present

## 2023-03-10 DIAGNOSIS — H16211 Exposure keratoconjunctivitis, right eye: Secondary | ICD-10-CM | POA: Diagnosis not present

## 2023-03-10 DIAGNOSIS — H01002 Unspecified blepharitis right lower eyelid: Secondary | ICD-10-CM | POA: Diagnosis not present

## 2023-03-10 DIAGNOSIS — H02132 Senile ectropion of right lower eyelid: Secondary | ICD-10-CM | POA: Diagnosis not present

## 2023-03-10 DIAGNOSIS — H01001 Unspecified blepharitis right upper eyelid: Secondary | ICD-10-CM | POA: Diagnosis not present

## 2023-03-15 DIAGNOSIS — H11153 Pinguecula, bilateral: Secondary | ICD-10-CM | POA: Diagnosis not present

## 2023-03-15 DIAGNOSIS — H01001 Unspecified blepharitis right upper eyelid: Secondary | ICD-10-CM | POA: Diagnosis not present

## 2023-03-15 DIAGNOSIS — Z961 Presence of intraocular lens: Secondary | ICD-10-CM | POA: Diagnosis not present

## 2023-03-15 DIAGNOSIS — H02831 Dermatochalasis of right upper eyelid: Secondary | ICD-10-CM | POA: Diagnosis not present

## 2023-03-15 DIAGNOSIS — H01005 Unspecified blepharitis left lower eyelid: Secondary | ICD-10-CM | POA: Diagnosis not present

## 2023-03-15 DIAGNOSIS — H01002 Unspecified blepharitis right lower eyelid: Secondary | ICD-10-CM | POA: Diagnosis not present

## 2023-03-15 DIAGNOSIS — H0014 Chalazion left upper eyelid: Secondary | ICD-10-CM | POA: Diagnosis not present

## 2023-03-15 DIAGNOSIS — H01004 Unspecified blepharitis left upper eyelid: Secondary | ICD-10-CM | POA: Diagnosis not present

## 2023-03-15 DIAGNOSIS — H16211 Exposure keratoconjunctivitis, right eye: Secondary | ICD-10-CM | POA: Diagnosis not present

## 2023-03-15 DIAGNOSIS — H02132 Senile ectropion of right lower eyelid: Secondary | ICD-10-CM | POA: Diagnosis not present

## 2023-03-24 ENCOUNTER — Ambulatory Visit (INDEPENDENT_AMBULATORY_CARE_PROVIDER_SITE_OTHER): Payer: Medicare Other | Admitting: Podiatry

## 2023-03-24 DIAGNOSIS — M79675 Pain in left toe(s): Secondary | ICD-10-CM

## 2023-03-24 DIAGNOSIS — G63 Polyneuropathy in diseases classified elsewhere: Secondary | ICD-10-CM

## 2023-03-24 DIAGNOSIS — B351 Tinea unguium: Secondary | ICD-10-CM

## 2023-03-24 DIAGNOSIS — M79674 Pain in right toe(s): Secondary | ICD-10-CM

## 2023-03-24 NOTE — Progress Notes (Signed)
  Subjective:  Patient ID: Calvin Sloan, male    DOB: 02-Jan-1934,  MRN: [id number]  No chief complaint on file.   89 y.o. male presents with the above complaint. History confirmed with patient. Patient presenting with pain related to dystrophic thickened elongated nails. Patient is unable to trim own nails related to nail dystrophy and/or mobility issues. Patient does not have a history of T2DM.   Objective:  Physical Exam: warm, good capillary refill nail exam onychomycosis of the toenails, onycholysis, and dystrophic nails DP pulses palpable, PT pulses palpable, and protective sensation absent Left Foot:  Pain with palpation of nails due to elongation and dystrophic growth.  Right Foot: Pain with palpation of nails due to elongation and dystrophic growth.   Assessment:   1. Pain due to onychomycosis of toenails of both feet   2. Polyneuropathy associated with underlying disease (HCC)      Plan:  Patient was evaluated and treated and all questions answered.  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 -Educated on self-care  Procedure: Nail Debridement Rationale: Pain Type of Debridement: manual, sharp debridement. Instrumentation: Nail nipper, rotary burr. Number of Nails: 10  Return in about 3 months (around 06/24/2023) for RFC.         Corinna Gab, DPM [facility] / [facility]

## 2023-03-28 DIAGNOSIS — I4891 Unspecified atrial fibrillation: Secondary | ICD-10-CM | POA: Diagnosis not present

## 2023-03-28 DIAGNOSIS — Z86711 Personal history of pulmonary embolism: Secondary | ICD-10-CM | POA: Diagnosis not present

## 2023-03-28 DIAGNOSIS — R030 Elevated blood-pressure reading, without diagnosis of hypertension: Secondary | ICD-10-CM | POA: Diagnosis not present

## 2023-03-29 DIAGNOSIS — G7001 Myasthenia gravis with (acute) exacerbation: Secondary | ICD-10-CM | POA: Diagnosis not present

## 2023-03-30 DIAGNOSIS — G7001 Myasthenia gravis with (acute) exacerbation: Secondary | ICD-10-CM | POA: Diagnosis not present

## 2023-04-12 ENCOUNTER — Ambulatory Visit: Payer: Medicare Other | Admitting: Neurology

## 2023-04-25 DIAGNOSIS — I4891 Unspecified atrial fibrillation: Secondary | ICD-10-CM | POA: Diagnosis not present

## 2023-04-25 DIAGNOSIS — R030 Elevated blood-pressure reading, without diagnosis of hypertension: Secondary | ICD-10-CM | POA: Diagnosis not present

## 2023-04-27 DIAGNOSIS — G7001 Myasthenia gravis with (acute) exacerbation: Secondary | ICD-10-CM | POA: Diagnosis not present

## 2023-04-28 DIAGNOSIS — G7001 Myasthenia gravis with (acute) exacerbation: Secondary | ICD-10-CM | POA: Diagnosis not present

## 2023-05-03 DIAGNOSIS — M1732 Unilateral post-traumatic osteoarthritis, left knee: Secondary | ICD-10-CM | POA: Diagnosis not present

## 2023-05-25 DIAGNOSIS — G7001 Myasthenia gravis with (acute) exacerbation: Secondary | ICD-10-CM | POA: Diagnosis not present

## 2023-05-26 DIAGNOSIS — G7001 Myasthenia gravis with (acute) exacerbation: Secondary | ICD-10-CM | POA: Diagnosis not present

## 2023-05-27 DIAGNOSIS — R030 Elevated blood-pressure reading, without diagnosis of hypertension: Secondary | ICD-10-CM | POA: Diagnosis not present

## 2023-05-27 DIAGNOSIS — I4891 Unspecified atrial fibrillation: Secondary | ICD-10-CM | POA: Diagnosis not present

## 2023-06-15 DIAGNOSIS — H02132 Senile ectropion of right lower eyelid: Secondary | ICD-10-CM | POA: Diagnosis not present

## 2023-06-15 DIAGNOSIS — H16211 Exposure keratoconjunctivitis, right eye: Secondary | ICD-10-CM | POA: Diagnosis not present

## 2023-06-15 DIAGNOSIS — H01001 Unspecified blepharitis right upper eyelid: Secondary | ICD-10-CM | POA: Diagnosis not present

## 2023-06-15 DIAGNOSIS — H01002 Unspecified blepharitis right lower eyelid: Secondary | ICD-10-CM | POA: Diagnosis not present

## 2023-06-20 DIAGNOSIS — Z7901 Long term (current) use of anticoagulants: Secondary | ICD-10-CM | POA: Diagnosis not present

## 2023-06-22 DIAGNOSIS — G7001 Myasthenia gravis with (acute) exacerbation: Secondary | ICD-10-CM | POA: Diagnosis not present

## 2023-06-23 DIAGNOSIS — G7001 Myasthenia gravis with (acute) exacerbation: Secondary | ICD-10-CM | POA: Diagnosis not present

## 2023-06-30 ENCOUNTER — Ambulatory Visit: Payer: Medicare Other | Admitting: Podiatry

## 2023-07-15 ENCOUNTER — Encounter: Payer: Self-pay | Admitting: Podiatry

## 2023-07-15 ENCOUNTER — Ambulatory Visit (INDEPENDENT_AMBULATORY_CARE_PROVIDER_SITE_OTHER): Payer: Medicare Other | Admitting: Podiatry

## 2023-07-15 VITALS — Ht 69.0 in | Wt 210.5 lb

## 2023-07-15 DIAGNOSIS — M79675 Pain in left toe(s): Secondary | ICD-10-CM

## 2023-07-15 DIAGNOSIS — G63 Polyneuropathy in diseases classified elsewhere: Secondary | ICD-10-CM

## 2023-07-15 DIAGNOSIS — M79674 Pain in right toe(s): Secondary | ICD-10-CM | POA: Diagnosis not present

## 2023-07-15 DIAGNOSIS — B351 Tinea unguium: Secondary | ICD-10-CM | POA: Diagnosis not present

## 2023-07-15 DIAGNOSIS — L853 Xerosis cutis: Secondary | ICD-10-CM

## 2023-07-15 NOTE — Progress Notes (Signed)
  Subjective:  Patient ID: Calvin Sloan, male    DOB: 10/19/1933,  MRN: [id number]  Chief Complaint  Patient presents with   Nail Problem    Pt is here for RFC.    89 y.o. male presents with the above complaint. History confirmed with patient. Patient presenting with pain related to dystrophic thickened elongated nails. Patient is unable to trim own nails related to nail dystrophy and/or mobility issues. Patient does not have a history of T2DM.   Objective:  Physical Exam: warm, good capillary refill, dry xerotic pedal skin nail exam onychomycosis of the toenails, onycholysis, and dystrophic nails DP pulses palpable, PT pulses palpable, and protective sensation absent Left Foot:  Pain with palpation of nails due to elongation and dystrophic growth.  Right Foot: Pain with palpation of nails due to elongation and dystrophic growth.   Assessment:   1. Pain due to onychomycosis of toenails of both feet   2. Polyneuropathy associated with underlying disease (HCC)   3. Xerosis cutis      Plan:  Patient was evaluated and treated and all questions answered.  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 -Educated on self-care  Procedure: Nail Debridement Rationale: Pain Type of Debridement: manual, sharp debridement. Instrumentation: Nail nipper, rotary burr. Number of Nails: 10  # Xerosis cutis -Discussed regular moisturization of pedal skin with lotions, creams, use of Vaseline.  Return in about 3 months (around 10/12/2023) for Routine Foot Care.         Barbaraann Share, DPM [facility] / [facility]

## 2023-07-19 DIAGNOSIS — Z7901 Long term (current) use of anticoagulants: Secondary | ICD-10-CM | POA: Diagnosis not present

## 2023-07-19 DIAGNOSIS — R030 Elevated blood-pressure reading, without diagnosis of hypertension: Secondary | ICD-10-CM | POA: Diagnosis not present

## 2023-07-27 DIAGNOSIS — G7001 Myasthenia gravis with (acute) exacerbation: Secondary | ICD-10-CM | POA: Diagnosis not present

## 2023-07-28 DIAGNOSIS — G7001 Myasthenia gravis with (acute) exacerbation: Secondary | ICD-10-CM | POA: Diagnosis not present

## 2023-08-01 DIAGNOSIS — M1732 Unilateral post-traumatic osteoarthritis, left knee: Secondary | ICD-10-CM | POA: Diagnosis not present

## 2023-08-22 DIAGNOSIS — R030 Elevated blood-pressure reading, without diagnosis of hypertension: Secondary | ICD-10-CM | POA: Diagnosis not present

## 2023-08-22 DIAGNOSIS — Z7901 Long term (current) use of anticoagulants: Secondary | ICD-10-CM | POA: Diagnosis not present

## 2023-08-24 DIAGNOSIS — G7001 Myasthenia gravis with (acute) exacerbation: Secondary | ICD-10-CM | POA: Diagnosis not present

## 2023-08-25 DIAGNOSIS — G7001 Myasthenia gravis with (acute) exacerbation: Secondary | ICD-10-CM | POA: Diagnosis not present

## 2023-09-21 DIAGNOSIS — G7001 Myasthenia gravis with (acute) exacerbation: Secondary | ICD-10-CM | POA: Diagnosis not present

## 2023-09-22 DIAGNOSIS — G7001 Myasthenia gravis with (acute) exacerbation: Secondary | ICD-10-CM | POA: Diagnosis not present

## 2023-10-07 DIAGNOSIS — I4891 Unspecified atrial fibrillation: Secondary | ICD-10-CM | POA: Diagnosis not present

## 2023-10-07 DIAGNOSIS — Z7901 Long term (current) use of anticoagulants: Secondary | ICD-10-CM | POA: Diagnosis not present

## 2023-10-14 ENCOUNTER — Encounter: Payer: Self-pay | Admitting: Podiatry

## 2023-10-14 ENCOUNTER — Ambulatory Visit (INDEPENDENT_AMBULATORY_CARE_PROVIDER_SITE_OTHER): Payer: Medicare Other | Admitting: Podiatry

## 2023-10-14 DIAGNOSIS — M79674 Pain in right toe(s): Secondary | ICD-10-CM | POA: Diagnosis not present

## 2023-10-14 DIAGNOSIS — M79675 Pain in left toe(s): Secondary | ICD-10-CM | POA: Diagnosis not present

## 2023-10-14 DIAGNOSIS — B353 Tinea pedis: Secondary | ICD-10-CM

## 2023-10-14 DIAGNOSIS — B351 Tinea unguium: Secondary | ICD-10-CM | POA: Diagnosis not present

## 2023-10-14 DIAGNOSIS — G63 Polyneuropathy in diseases classified elsewhere: Secondary | ICD-10-CM | POA: Diagnosis not present

## 2023-10-14 MED ORDER — KETOCONAZOLE 2 % EX CREA
1.0000 | TOPICAL_CREAM | Freq: Every day | CUTANEOUS | 0 refills | Status: AC
Start: 2023-10-14 — End: 2023-11-13

## 2023-10-14 NOTE — Progress Notes (Signed)
  Subjective:  Patient ID: Calvin Sloan, male    DOB: June 21, 1933,  MRN: [id number]  Chief Complaint  Patient presents with   Plantar Warts   routine foot care    Routine foot care    89 y.o. male presents with the above complaint. History confirmed with patient. Patient presenting with pain related to dystrophic thickened elongated nails. Patient is unable to trim own nails related to nail dystrophy and/or mobility issues. Patient does not have a history of T2DM.  He does endorse history of neuropathy and reports some neuropathic pain.  Objective:  Physical Exam: warm, good capillary refill, dry xerotic pedal skin with flaking rash with some peeling present. nail exam onychomycosis of the toenails, onycholysis, and dystrophic nails DP pulses palpable, PT pulses palpable, and protective sensation absent Left Foot:  Pain with palpation of nails due to elongation and dystrophic growth.  Right Foot: Pain with palpation of nails due to elongation and dystrophic growth.   Assessment:   1. Tinea pedis of both feet   2. Polyneuropathy associated with underlying disease (HCC)   3. Pain due to onychomycosis of toenails of both feet      Plan:  Patient was evaluated and treated and all questions answered.  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 - Does have absent protective sensation to the toes  #Onychomycosis with pain  -Nails palliatively debrided as below. -Educated on self-care  Procedure: Nail Debridement Rationale: Pain Type of Debridement: manual, sharp debridement. Instrumentation: Nail nipper, rotary burr. Number of Nails: 10  # Tinea pedis - Discussed importance of pedal hygiene in the treatment and management of athlete's foot - Sending in course of topical ketoconazole  cream 2% to be applied to dry, flaky, xerotic pedal skin once a day over the next 3 weeks - Follow-up in 3 to 4 weeks if this does not improve  Return in about 3  months (around 01/14/2024) for Routine Foot Care.         Reina Cara, DPM [facility] / [facility]

## 2023-10-19 DIAGNOSIS — G7001 Myasthenia gravis with (acute) exacerbation: Secondary | ICD-10-CM | POA: Diagnosis not present

## 2023-10-20 DIAGNOSIS — G7001 Myasthenia gravis with (acute) exacerbation: Secondary | ICD-10-CM | POA: Diagnosis not present

## 2023-10-27 DIAGNOSIS — M1732 Unilateral post-traumatic osteoarthritis, left knee: Secondary | ICD-10-CM | POA: Diagnosis not present

## 2023-11-07 DIAGNOSIS — Z7901 Long term (current) use of anticoagulants: Secondary | ICD-10-CM | POA: Diagnosis not present

## 2023-11-16 DIAGNOSIS — G7001 Myasthenia gravis with (acute) exacerbation: Secondary | ICD-10-CM | POA: Diagnosis not present

## 2023-11-17 DIAGNOSIS — G7001 Myasthenia gravis with (acute) exacerbation: Secondary | ICD-10-CM | POA: Diagnosis not present

## 2023-11-24 ENCOUNTER — Other Ambulatory Visit: Payer: Self-pay | Admitting: Neurology

## 2023-11-24 DIAGNOSIS — D649 Anemia, unspecified: Secondary | ICD-10-CM | POA: Diagnosis not present

## 2023-11-24 DIAGNOSIS — R7303 Prediabetes: Secondary | ICD-10-CM | POA: Diagnosis not present

## 2023-11-24 DIAGNOSIS — E875 Hyperkalemia: Secondary | ICD-10-CM | POA: Diagnosis not present

## 2023-11-24 DIAGNOSIS — N189 Chronic kidney disease, unspecified: Secondary | ICD-10-CM | POA: Diagnosis not present

## 2023-11-24 DIAGNOSIS — I10 Essential (primary) hypertension: Secondary | ICD-10-CM | POA: Diagnosis not present

## 2023-11-28 NOTE — Telephone Encounter (Signed)
 Wife has called to f/u on the refill request, she has also scheduled a 1 yr f/u for 7-21 with Lauraine, NP

## 2023-11-29 DIAGNOSIS — Z6830 Body mass index (BMI) 30.0-30.9, adult: Secondary | ICD-10-CM | POA: Diagnosis not present

## 2023-11-29 DIAGNOSIS — Z86711 Personal history of pulmonary embolism: Secondary | ICD-10-CM | POA: Diagnosis not present

## 2023-11-29 DIAGNOSIS — N189 Chronic kidney disease, unspecified: Secondary | ICD-10-CM | POA: Diagnosis not present

## 2023-11-29 DIAGNOSIS — Z8673 Personal history of transient ischemic attack (TIA), and cerebral infarction without residual deficits: Secondary | ICD-10-CM | POA: Diagnosis not present

## 2023-11-29 DIAGNOSIS — G7000 Myasthenia gravis without (acute) exacerbation: Secondary | ICD-10-CM | POA: Diagnosis not present

## 2023-12-05 ENCOUNTER — Ambulatory Visit (INDEPENDENT_AMBULATORY_CARE_PROVIDER_SITE_OTHER): Admitting: Neurology

## 2023-12-05 ENCOUNTER — Encounter: Payer: Self-pay | Admitting: Neurology

## 2023-12-05 VITALS — BP 144/56 | HR 61 | Ht 69.0 in | Wt 207.0 lb

## 2023-12-05 DIAGNOSIS — C4490 Unspecified malignant neoplasm of skin, unspecified: Secondary | ICD-10-CM | POA: Diagnosis not present

## 2023-12-05 DIAGNOSIS — G7001 Myasthenia gravis with (acute) exacerbation: Secondary | ICD-10-CM

## 2023-12-05 NOTE — Patient Instructions (Signed)
 Great to see you today! Continue IVIG, check labs today  Follow up in 1 year. Thanks!

## 2023-12-05 NOTE — Progress Notes (Signed)
 ASSESSMENT AND PLAN 89 y.o. year old male   1.  Seropositive myasthenia gravis with exacerbation, Stable, continues to feel benefit from IVIG, will continue Following his COVID diagnosis in April 2022, increased difficulty with swallowing, general weakness Continued complaints of weakness following 6 days of Medrol  pack, higher dose of Mestinon , restarted on IVIG treatment, loading dose on May 18, followed by 1 g/kg maintenance dose June 15, November 18, 2020, last infusion was in April 2023 Took a break from IVIG May-August 2023, restarted in timbre 2023 due to difficulty swallowing, continues to benefit from IVIG, wishes to continue, still working in his yard Continue IVIG 1 g/kg every 4 weeks. 50 g x 2 days Continue Mestinon  60 mg 4 times daily  Not a good candidate for long-term immunosuppressive treatment, due to previous history of extensive skin cancer, could not tolerate CellCept, Imuran  Check routine labs today    2.  Shortness of breath  Baseline intermittent shortness of breath with exertion, this has been a problem since his PE in 2003, on chronic Coumadin treatment  He also has mild persistent bilateral eye closure, cheek puff muscle weakness due to multiple facial skin surgery, blepharoplasty  Follow up in 1 year with Dr. Onita     DIAGNOSTIC DATA (LABS, IMAGING, TESTING) - I reviewed patient records, labs, notes, testing and imaging myself    Labs in May 2023 low normal B 12 259, TSH 1.980, CK 209  EEG was normal  MRI of the brain showed no acute abnormality.  There was evidence of chronic left cerebellar infarction, moderate supratentorium small vessel disease.  HISTORY OF PRESENT ILLNESS:  Calvin Sloan  is a 89 -year-old right-handed white married male with generalized seropositive myasthenia gravis characterized by bulbar weakness, shortness of breath , and dysphagia. Patient of Dr. Maurice   He was diagnosed in 10/2002 with a  positive Tensilon test, positive  acetylchoine receptor antibody,and had a positive ANA at 1-160 speckled pattern. Initially he had right eye ptosis and subsequently, double vision, fatigue with chewing, dysphagia, and upper and lower extremity weakness. He was initially treated with Mestinon  bromide in June 2004,  which was associated with GI side effects.   Prednisone  was started in 03/29/2003 and CellCept 05/2003. He developed increased jitteriness, rash, and cramps on CellCept which was discontinued 10/2003. He had progressive bulbar weakness and was admitted 12/2003 for IV IgG therapy which required a PEG tube for dysphagia transiently. He was started on Imuran. 01/17/2004 he was readmitted for 2 days of IV IgG because of shortness of breath. He has not been hospitalized since 01/2004.    He has been slowly tapered off prednisone  09/2007 and was on Imuran and Mestinon  bromide.    He had B12 deficiency and was receiving B12 shots ,but  B12 levels off injections have been normal and B12 shots were discontinued.   He has  a history of vague visual loss on the left of unknown etiology. He denies swallowing problems, speech problems, voice changes, focal weakness, or double vision.   He is independent in activities of daily living and takes one nap per day.He denies memory loss but his wife states that he does have memory loss. His last blood studies in my office 05/27/10 were normal except a low white blood cell count of 3900.   His dermatologist Dr. Norleen Peers was concerned that the Imuran is associated with increased risk of skin cancer and the imuran was discontinued in 12/30/2010. His skin cancer (  basal cell cancer) has improved since his imuran was stopped.   He has noted no change In myasthenic symptoms. His skin has improved. His medication for myasthenia is mesinon bromide 60  milligrams one and one half tablets 4 times a day. He has shortness of breath since his pulmonary emboli in 2003, also complains of excessive fatigue.     He is allergic to aspirin, GI side effect, gastric ulcer,  and is on Plavix for a history of TIAs with right arm weakness He has  swallowing problems at the beginning of the meal with liquids that improves as the meal continues.     He had right CTS release in May 2014 by Dr. Leonor.   He complains of shortness of breath with walking, bending over to tire his shoed, due to PE 2003, pulmnolgist folllowed up, he does not like to chew on tough beef, he denies swallowing difficulty, no double vision, no droopy eyelid, no significant bleeding muscle weakness. He is active in yard, shop, threadmill     UPDATE Jan 16th 2015: He is now tapering off Mestinon , there was no significant change in his functional status, he denies double vision, no ptosis, continue complains of shortness of breath with exertion no significant and muscle weakness, he has to wash down his food sometimes, but no significant swelling, or chewing difficulty, no dysarthria   UPDATE July 16th 2015: He is no longer mestinon , he does complains of increased breathing difficulty from prolonged walking, no chewing difficulty, no double vision, no ptosis.   Laboratory continue to demonstrate positive acetylcholine binding, and modulating antibodies.  He did reported that his skin cancer has improved after stopping imuran, could not tolerate cellcept per record.  Previously he responded to IVIG very well.   UPDATE Sept 25th 2015: He was stated on prednisone  10mg  qday, and mestinon  60mg  tid in June 2015, which did help his fatigue, shortness of breath, wife reported 50% improvement, he has increased appetite   But he was admitted to [facility] in Sept 17th for bilateral PEs, right leg DVT, he is started on coumadin, bridge with Lovenox, Dr. Kayla is monitoring his INR   He denies double vision, no ptosis, no limb muscle weakness, no dysphagia   UPDATE April 4th 2016: He is on coumadin for PE, INR was monitored by Dr. Kayla, his  myasthenia gravis is under good control, is taking prednisone  10 mg every day, he has surgery in Nov 2015 for right leg cellulitis.   He only does little around his house, he has no double vision, SOB due to previous PE,    UPDATE Feb 18 2015: Previous laboratory evaluation showed mild B12 deficiency with level of 204, He did receive IM B12 supplement at his primary care Dr. Cheryle office for a while, now with normal repeat laboratory evaluation, injection has stopped He has mild choking difficulty, this has been going on since August 2016, not getting worse,   he has more trouble with liquid. He complains of shortness of breath when bending over. He denies significant gait difficulty, no breathing difficulty otherwise, he denies double vision, no ptosis,, No chewing difficulties     UPDATE May 22 2015: He came in last visit April 16 2015 complains of worsening blurry vision, double vision, swallowing difficulties, trouble talking, he reported 50% weakness compared to baseline, difficulty getting up from seated position, difficulty chewing, worsening shortness of breath, on examination, he was noted to have increased weakness compared to previous examination, he  had moderate eye-closure cheek puff, mild neck flexion weakness, mild bilateral shoulder abduction bilateral hip flexion weakness.   He was treated with IVIG 400 mg/kg every day for 5 days from December 11 to May 02 2015, his vision has much improved, he can walk better, but he continue have swallowing difficulty, dysarthria, he is also taking prednisone  40 mg every day, increase his Mestinon  60 mg to 3-4 tablets daily   He was not able to tolerate immunosuppression treatment in the past due to skin cancer   UPDATE Jul 07 2015:   I have called his wife, pulmonary function test in June 05 2015.   1, FVC is 3.1 5, 87%, this is normal 2. FEV1 is 2.1 6, 77%, with a percent FEV1 of 68, this indicated mild to moderate primary small   obstruction in this patient was a history of cigarette smoking, large elderly flow rates are generally normal   3. Flow volume loop is unremarkable 4, lung volumes indicate some evidence of air trapping with an increased residual volume, the TLC is normal, the increased residual volume confirms the presence of airflow obstruction   5, DLCO is 19.0, 90%, this is normal 6, elderly resistant 3.7 2, 265%, this is increased   Wife reported, he recently had swallowing study, there was no significant abnormality found, his swallowing, breathing, symptoms overall has improved   He did think that IVIG treatment in Dec 11-16th 2016 has helped his blurry vision, it is gone, he still feel straggled sometimes, he has shortness with exertion and bending over. His talking is much better.   UPDATE May 22nd 2017: He continue complains of shortness of breath with minimum exertion, no diplopia, no double vision, mild dysphasia,   He had a history of right carpal tunnel release surgery in the past, which has been helpful, now complains of left first 3 finger paresthesia, EMG nerve conduction studies was planned at local hospital   UPDATE July 6th 2017: He stopped taking Mestinon  around May 20 third 2017, shortly afterwards, he noticed double vision, he clearly describe eighth of binocular double vision, sometimes vertigo, sometimes horizontal, he went on higher dose of Mestinon  60 mg 4 times a day without improving his symptoms, he slept on a bed with 30 head raise, with no significant breathing difficulty, he could not eat his dinner on July 5th 2017 due to chewing swallowing difficulty, he could not fly flat sleeping, mildly unsteady gait, increased fatigue,   UPDATE August 17th 2017: Last IVIG treatment was December 05 2015, he did very well for a while, was able to cut grass, trim his lawn, it did helped him for at least 2 weeks, then he had passing out    I reviewed laboratory evaluation in August 2017, normal  B12, ferritin level, iron binding capacity was within normal limit, low hemoglobin 10 point 8, creatinine was elevated 1.53, glucose was 125   Previous laboratory evaluation in 2016 showed normal folic acid  12 point 5,low normal B12 level 266, hemoglobin in January 2015 was 14 point 3   He will be evaluated by hematologist/oncologist Dr. Stevphen in September 6th 2017, at [facility]   He complains of low back pain since he fell you August 1st 2017, has been taking intermittent Tylenol ,   He complains of difficulty swallowing, both liquid, and solid food, no double vision, He is taking mestinon  60mg  4 times a day, which did help him.   Update January 29 2016: I have reviewed his  hematologist Dr. Maryla note on September 6th 2017, normocytic microchromic anemia, normal WBC, platelet count, likely from chronic kidney disease, history of fetal deficiency, is on B12 supplement, there was a consideration of low level hemolytic anemia from IVIG infusion, no overt evidence of blood loss   I reviewed laboratory evaluation in September 2017, parietal cell antibody IgG was negative, intrinsic factor negative, mild elevated kappa free light chain, Hg 13.8, creat 1.38, GFR 47, ferritin 163, normal TSh 1.5, elevated vitamin B12 1480   He complains more fatigue, more swallowing difficulty, decreased po intake, he could not drinking water  because worry about the choking episode, increased shortness of breath with exertion, intermittent blurry version, difficulty closing his eyes.   He is now taking mestinon  60mg  qid, no significant side effect, was not sure about the benefit.   Also reviewed swallowing evaluation on June 06 2015 at [facility], there was mild oropharyngeal dysphagia, characterized by spillover to the valleculae and the piriform sinus secondary to reduced oral motor coordination    patient was felt to safe to consume a regular diet with a full range of liquid with   recommendation of upright for food by mouth, and 45 minutes after meals, head flexed slightly forward , swallow at slower rate   UPDATE Oct 16th 2017: I reviewed laboratory in October 2017, immune of fixative electrophoresis showed mild elevated Ig G2.8, otherwise was normal, mild anemia hemoglobin 11.7, mild elevated kappa.free light chain 26, mild elevated creatinine 1.38, normal ferritin 163, normal iron panel, TSH, folic acid , B12 1480,   Last ivig was in Sept, it has helped him 80%,  he can chew and swallow, he continue complains of blurry vision,   UPDATE May 05 2016: He is overall much improved, but still complains of SOB, is at his baseline, last infusion was on Nov 27, 28th, he will have more infusion in December, he has mild dysarthria, slight dysphagia will see hematologist Dr. Sally in January 2018,    UPDATE Oct 04 2018: Patient complains of gradual onset swallowing difficulty since April 2020, to the point he has to modify his daily diet, he can only eat soft food, also increased dyspnea upon exertion, has to cut back his daily activity, he denies double vision, no droopy eyelid, denies difficulty to close his eyes tightly, he denies significant limb muscle weakness,   Update December 13, 2018: He is accompanied by his wife at today's clinic visit, he had received IVIG treatment in June, July, planning on receiving it again on August 3 to 10/2018, he tolerated infusion well, noticed moderate to significant improvement of his muscle weakness, 80% back to his baseline, he can swallowing better, move better, but continue noticed mild bilateral upper extremity weakness, taking Mestinon  4 times a day, prednisone  10 mg daily   UPDATE July 16 2019: He had IVIG treatment in June, July, August, September 2020, after that, he was able to bounce back to baseline, gradually tapered off prednisone  treatment,   He is still taking Mestinon  60 mg 4 times a day, complains of shortness of breath  occasionally denied double vision, swallowing difficulty  UPDATE Mar 19 2020: Because of the increased swallowing difficulty, double vision, he received IVIG treatment loading dose 2 g/kg in September, maintenance dose in October, he reported significant improvement, now close to baseline, gained 7 pounds over the past few months, has third infusion planned in November  He only take Mestinon  as needed, no longer taking prednisone ,  UPDATE December 02 2020:  Following his COVID infection on August 28, 2020, presented with cough, stomachache, increased difficulty breathing, also increased swallowing, dysarthria, despite taking higher dose of Mestinon  1.5 mg 3 times a day, also complains of generalized weakness, decreased mobility  He was noted to have mild neck flexion, proximal upper lower extremity muscle weakness at last visit on Sep 16, 2020 with several, we decided to proceed with IVIG,  Loading dose 2 g/kg on May 18, followed by maintenance dose 1 g/kg on June 15, November 18, 2020  Now he feels much better, back to his baseline, still has frequent cough, mild shortness of breath, on residual deficit of his PE, no longer has swallowing difficulty, getting stronger, no significant gait abnormality,   UPDATE December 29 2020: He was doing so well last visit on December 02, 2020, we decided to hold off IVIG maintenance dose 1 g/kg, he reported a week history of gradually worsening swallowing chewing difficulty, despite taking Mestinon  up to 60 mg 3 times a day, he described difficulty to swallow down food, has to wash down with drinks, induced cough sometimes also noticed mild increased slurred voice,  He had a history of PE, taking Coumadin, shortness of breath at baseline, no significant change, no significant gait abnormality, he denies double vision  Update April 21, 2021 He is accompanied by his wife at today's visit, tolerating IVIG 1 g/kg every 4 weeks, continue to require Mestinon  60 mg 4 times a  day, still complains of frequent fatigue, difficulty getting up from seated position, mild swallowing difficulty especially at the end of 3 weeks, difficulty closing eyes, frequent tearing, denies significant double vision  UPDATE Sep 17 2021: He is accompanied by his wife at today's clinical visit, his myasthenia gravis is overall under good control, has on continued IVIG infusions since July 2022, he denies swallowing difficulty, no double vision, baseline shortness of breath due to his previous PE, multiple facial skin cancer surgery, right eye plastic surgery due to difficulty closing his right eye, mild bilateral eye closure weakness   He had one passing out episode was treated at [facility] on September 10, 2021, he got up at his usual hours, finished breakfast, lab work on his lawnmower, he lied on the floor to take off the blade, sharp the blade in a standing position, limited by back on the floor to put it into place,  After that he went home, wife changed his skin dressing at the posterior ear, where he had recent skin cancer surgery, he complains of significant pain, during the process, he suddenly went limp, feel body jerks, unresponsive, wife called 911, he quickly came around  Now he is back to his baseline, he has no recollection of the event, no warning signs, deny a previous history of seizure  Telemetry monitoring showed no significant abnormality,  Laboratory evaluation on September 11 2021, hemoglobin was 12, elevated creatinine 1.55, albumin was 3.3, normal liver functional test, hypersensitivity troponin was elevated to 102, alcohol less than 3, lactate 1.1, Ultrasound of Doppler study showed mild carotid atherosclerosis, no significant stenosis, antegrade flow bilateral vertebral artery Echocardiogram showed ejection fraction 60 to 65%, moderate tricuspid regurgitation, moderate pulmonary hypertension  CT head without contrast showed atrophy, ventriculomegaly,  periventricular white matter disease, remote left cerebellar infarction, Update March 25, 2022 SS: Here today with his wife. IVIG was restarted September 2023 due to worsening swallowing ability following URI.  2 g/kg loading, divided into 4 days 50 g daily x 4  days/followed 1 g/kg every 4 weeks 50 g x 2 days.  He is doing better now with IVIG. No trouble with swallowing now, chews well. No change in weakness. Still works in his yard, getting leaves up. Stopped IVIG May-August.  Remains on Mestinon .  No further seizure-like spells.  Wishes to remain on IVIG. 09/23/22 Dr. Onita: He is accompanied by his wife at today's clinical visit, continue have mild swallowing difficulty, especially at the end of last IVIG infusion cycle, which he is getting every 4 weeks, tolerating infusion well, previous multiple attempts to stop the infusion always trigger recurrent symptoms after few months, doing much better with every 4 weeks 1 g/kg infusion,  Update 12/05/23 SS: Remains on IVIG, week after getting feels stronger. Chews slow, swallow careful. Remains on Mestinon  60 mg 4 times a day. Walks in [employer], but not falls, is steady. Active with lawn work. Tolerating IVIG, wishes to continue,  every 4 weeks, 100 g every 4 weeks split over 2 days, helps with swallowing.  PHYSICAL EXAM  Vitals:   12/02/20 1255  BP: (!) 149/60  Pulse: (!) 59  Weight: 209 lb 8 oz (95 kg)  Height: 5' 9 (1.753 m)   Body mass index is 30.94 kg/m.  Physical Exam  General: The patient is alert and cooperative at the time of the examination.  Very nice, pleasant  Skin: No significant peripheral edema is noted.   Neurologic Exam  Mental status: The patient is alert and oriented x 3 at the time of the examination. The patient has apparent normal recent and remote memory, with an apparently normal attention span and concentration ability.  Voice is soft, slightly hoarse.  Hard of hearing.  Cranial nerves: Facial symmetry is  present. Extraocular movements are full. Visual fields are full.  Mild to moderate eye open weakness, mild cheek puff weakness.  Right lateral eye open is narrow from surgery.   Motor: The patient has good strength in all 4 extremities.  No neck weakness was noted.  Sensory examination: Soft touch sensation is symmetric on the face, arms, and legs.  Coordination: The patient has good finger-nose-finger and heel-to-shin bilaterally.  Gait and station: Able to stand from seated position without pushoff, gait is wide-based, but steady and independent  Reflexes: Deep tendon reflexes are symmetric but decreased  REVIEW OF SYSTEMS:  Out of a complete 14 system review of symptoms, the patient complains only of the following symptoms, and all other reviewed systems are negative.  See HPI  ALLERGIES: Allergies  Allergen Reactions   Aspirin Nausea Only    Other Reaction(s): Not available   Shellfish Allergy Swelling    Other Reaction(s): Not available   Tramadol  Nausea Only and Rash    HOME MEDICATIONS: Outpatient Medications Prior to Visit  Medication Sig Dispense Refill   fluorouracil (EFUDEX) 5 % cream Apply topically.     ketoconazole  (NIZORAL ) 2 % shampoo ketoconazole  2 % shampoo     metroNIDAZOLE (METROGEL) 1 % gel Apply 1 application topically daily.     pyridostigmine  (MESTINON ) 60 MG tablet TAKE 1 TABLET IN THE MORNING, AT NOON, IN THE EVENING AND AT BEDTIME 360 tablet 3   trimethoprim-polymyxin b (POLYTRIM) ophthalmic solution polymyxin B sulfate 10,000 unit-trimethoprim 1 mg/mL eye drops     valsartan-hydrochlorothiazide (DIOVAN-HCT) 320-25 MG per tablet Take 0.5 tablets by mouth daily.     warfarin (COUMADIN) 5 MG tablet Take 5 mg by mouth daily. Take 7.5mg  by mouth on Tuesday and  Thursday and take 5 mg on Sunday, Monday, Wednesday, Friday and Saturday.     No facility-administered medications prior to visit.    PAST MEDICAL HISTORY: Past Medical History:  Diagnosis Date    B12 deficiency    COPD (chronic obstructive pulmonary disease) (HCC)    Essential tremor    Hypertension    Memory deficit    Myasthenia gravis (HCC)    since 2004   PONV (postoperative nausea and vomiting)    Pulmonary emboli (HCC)    in Nov 2003   Skin cancer     PAST SURGICAL HISTORY: Past Surgical History:  Procedure Laterality Date   CARPAL TUNNEL RELEASE Right 09/14/2012   Procedure: CARPAL TUNNEL RELEASE;  Surgeon: Lamar LULLA Leonor Mickey., MD;  Location: [facility];  Service: Orthopedics;  Laterality: Right;   CARPAL TUNNEL RELEASE Left 11/04/2015   Procedure: LEFT CARPAL TUNNEL RELEASE, EXTENDED INCISION;  Surgeon: Arley Curia, MD;  Location: [facility];  Service: Orthopedics;  Laterality: Left;  ANESTHESIA: IV REGIONAL UPPER ARM   CATARACT EXTRACTION Right    CATARACT EXTRACTION W/PHACO Left 03/12/2021   Procedure: CATARACT EXTRACTION PHACO AND INTRAOCULAR LENS PLACEMENT (IOC);  Surgeon: Harrie Agent, MD;  Location: [facility];  Service: Ophthalmology;  Laterality: Left;  CDE-42.07   CHOLECYSTECTOMY  2012   [facility]   COLONOSCOPY     FOOT SURGERY  1966   lt orif foot   SKIN CANCER EXCISION      FAMILY HISTORY: Family History  Problem Relation Age of Onset   Heart disease Mother    Heart disease Father     SOCIAL HISTORY: Social History   Socioeconomic History   Marital status: Married    Spouse name: [name]   Number of children: 2   Years of education: 12   Highest education level: Not on file  Occupational History   Occupation: Plant    Comment: Retired  Tobacco Use   Smoking status: Former    Current packs/day: 0.00    Average packs/day: 0.3 packs/day for 10.0 years (2.5 ttl pk-yrs)    Types: Cigarettes    Start date: 05/17/1958    Quit date: 05/17/1968    Years since quitting: 55.5   Smokeless tobacco: Never  Vaping Use   Vaping status: Never Used  Substance and Sexual Activity   Alcohol use: No   Drug use: No   Sexual  activity: Yes  Other Topics Concern   Not on file  Social History Narrative   He lives with his wife [name](, has 2 children, retired.    Hard of hearing.   Right handed.   Caffeine - None   Social Drivers of Corporate investment banker Strain: Not on file  Food Insecurity: Not on file  Transportation Needs: Not on file  Physical Activity: Not on file  Stress: Not on file  Social Connections: Not on file  Intimate Partner Violence: Not on file    Lauraine Born, SCHARLENE, DNP  [facility] [address], [address] [place], [place] [address] [phone]

## 2023-12-06 ENCOUNTER — Ambulatory Visit: Payer: Self-pay | Admitting: Neurology

## 2023-12-06 LAB — COMPREHENSIVE METABOLIC PANEL WITH GFR
ALT: 15 IU/L (ref 0–44)
AST: 23 IU/L (ref 0–40)
Albumin: 3.8 g/dL (ref 3.7–4.7)
Alkaline Phosphatase: 59 IU/L (ref 44–121)
BUN/Creatinine Ratio: 13 (ref 10–24)
BUN: 21 mg/dL (ref 8–27)
Bilirubin Total: 0.5 mg/dL (ref 0.0–1.2)
CO2: 26 mmol/L (ref 20–29)
Calcium: 9.2 mg/dL (ref 8.6–10.2)
Chloride: 105 mmol/L (ref 96–106)
Creatinine, Ser: 1.66 mg/dL — ABNORMAL HIGH (ref 0.76–1.27)
Globulin, Total: 2.9 g/dL (ref 1.5–4.5)
Glucose: 135 mg/dL — ABNORMAL HIGH (ref 70–99)
Potassium: 4.5 mmol/L (ref 3.5–5.2)
Sodium: 144 mmol/L (ref 134–144)
Total Protein: 6.7 g/dL (ref 6.0–8.5)
eGFR: 39 mL/min/1.73 — ABNORMAL LOW (ref >59)

## 2023-12-06 LAB — CBC WITH DIFFERENTIAL/PLATELET
Basophils Absolute: 0.1 x10E3/uL (ref 0.0–0.2)
Basos: 2 %
EOS (ABSOLUTE): 0.1 x10E3/uL (ref 0.0–0.4)
Eos: 2 %
Hematocrit: 36.9 % — ABNORMAL LOW (ref 37.5–51.0)
Hemoglobin: 12 g/dL — ABNORMAL LOW (ref 13.0–17.7)
Immature Grans (Abs): 0 x10E3/uL (ref 0.0–0.1)
Immature Granulocytes: 0 %
Lymphocytes Absolute: 1.1 x10E3/uL (ref 0.7–3.1)
Lymphs: 23 %
MCH: 31.1 pg (ref 26.6–33.0)
MCHC: 32.5 g/dL (ref 31.5–35.7)
MCV: 96 fL (ref 79–97)
Monocytes Absolute: 0.4 x10E3/uL (ref 0.1–0.9)
Monocytes: 8 %
Neutrophils Absolute: 3.1 x10E3/uL (ref 1.4–7.0)
Neutrophils: 65 %
Platelets: 203 x10E3/uL (ref 150–450)
RBC: 3.86 x10E6/uL — ABNORMAL LOW (ref 4.14–5.80)
RDW: 13 % (ref 11.6–15.4)
WBC: 4.8 x10E3/uL (ref 3.4–10.8)

## 2023-12-07 DIAGNOSIS — Z7901 Long term (current) use of anticoagulants: Secondary | ICD-10-CM | POA: Diagnosis not present

## 2023-12-08 DIAGNOSIS — M1732 Unilateral post-traumatic osteoarthritis, left knee: Secondary | ICD-10-CM | POA: Diagnosis not present

## 2023-12-08 DIAGNOSIS — M2392 Unspecified internal derangement of left knee: Secondary | ICD-10-CM | POA: Diagnosis not present

## 2023-12-08 DIAGNOSIS — M25562 Pain in left knee: Secondary | ICD-10-CM | POA: Diagnosis not present

## 2023-12-14 DIAGNOSIS — G7001 Myasthenia gravis with (acute) exacerbation: Secondary | ICD-10-CM | POA: Diagnosis not present

## 2023-12-15 DIAGNOSIS — G7001 Myasthenia gravis with (acute) exacerbation: Secondary | ICD-10-CM | POA: Diagnosis not present

## 2023-12-20 DIAGNOSIS — M67864 Other specified disorders of tendon, left knee: Secondary | ICD-10-CM | POA: Diagnosis not present

## 2023-12-20 DIAGNOSIS — M2342 Loose body in knee, left knee: Secondary | ICD-10-CM | POA: Diagnosis not present

## 2023-12-20 DIAGNOSIS — M1732 Unilateral post-traumatic osteoarthritis, left knee: Secondary | ICD-10-CM | POA: Diagnosis not present

## 2023-12-20 DIAGNOSIS — M65962 Unspecified synovitis and tenosynovitis, left lower leg: Secondary | ICD-10-CM | POA: Diagnosis not present

## 2023-12-20 DIAGNOSIS — R936 Abnormal findings on diagnostic imaging of limbs: Secondary | ICD-10-CM | POA: Diagnosis not present

## 2023-12-20 DIAGNOSIS — S83232A Complex tear of medial meniscus, current injury, left knee, initial encounter: Secondary | ICD-10-CM | POA: Diagnosis not present

## 2023-12-20 DIAGNOSIS — M23612 Other spontaneous disruption of anterior cruciate ligament of left knee: Secondary | ICD-10-CM | POA: Diagnosis not present

## 2023-12-20 DIAGNOSIS — M25462 Effusion, left knee: Secondary | ICD-10-CM | POA: Diagnosis not present

## 2023-12-20 DIAGNOSIS — M7122 Synovial cyst of popliteal space [Baker], left knee: Secondary | ICD-10-CM | POA: Diagnosis not present

## 2023-12-20 DIAGNOSIS — M1712 Unilateral primary osteoarthritis, left knee: Secondary | ICD-10-CM | POA: Diagnosis not present

## 2023-12-22 DIAGNOSIS — M2392 Unspecified internal derangement of left knee: Secondary | ICD-10-CM | POA: Diagnosis not present

## 2023-12-22 DIAGNOSIS — M25562 Pain in left knee: Secondary | ICD-10-CM | POA: Diagnosis not present

## 2023-12-22 DIAGNOSIS — M1732 Unilateral post-traumatic osteoarthritis, left knee: Secondary | ICD-10-CM | POA: Diagnosis not present

## 2024-01-06 DIAGNOSIS — M5432 Sciatica, left side: Secondary | ICD-10-CM | POA: Diagnosis not present

## 2024-01-06 DIAGNOSIS — Z6831 Body mass index (BMI) 31.0-31.9, adult: Secondary | ICD-10-CM | POA: Diagnosis not present

## 2024-01-09 DIAGNOSIS — Z7901 Long term (current) use of anticoagulants: Secondary | ICD-10-CM | POA: Diagnosis not present

## 2024-01-11 DIAGNOSIS — G7001 Myasthenia gravis with (acute) exacerbation: Secondary | ICD-10-CM | POA: Diagnosis not present

## 2024-01-12 DIAGNOSIS — G7001 Myasthenia gravis with (acute) exacerbation: Secondary | ICD-10-CM | POA: Diagnosis not present

## 2024-01-13 ENCOUNTER — Encounter: Payer: Self-pay | Admitting: Podiatry

## 2024-01-13 ENCOUNTER — Ambulatory Visit (INDEPENDENT_AMBULATORY_CARE_PROVIDER_SITE_OTHER): Admitting: Podiatry

## 2024-01-13 VITALS — Ht 69.0 in | Wt 207.0 lb

## 2024-01-13 DIAGNOSIS — M79674 Pain in right toe(s): Secondary | ICD-10-CM | POA: Diagnosis not present

## 2024-01-13 DIAGNOSIS — G63 Polyneuropathy in diseases classified elsewhere: Secondary | ICD-10-CM

## 2024-01-13 DIAGNOSIS — M79675 Pain in left toe(s): Secondary | ICD-10-CM

## 2024-01-13 DIAGNOSIS — B351 Tinea unguium: Secondary | ICD-10-CM

## 2024-01-13 NOTE — Progress Notes (Signed)
  Subjective:  Patient ID: Calvin Sloan, male    DOB: 03/04/1934,  MRN: [id number]  Chief Complaint  Patient presents with   Nail Problem    RM 6 Return in about 3 months (around 01/14/2024) for Routine Foot Care and nail trimming.    89 y.o. male presents with the above complaint. History confirmed with patient. Patient presenting with pain related to dystrophic thickened elongated nails. Patient is unable to trim own nails related to nail dystrophy and/or mobility issues. Patient does not have a history of T2DM.  He does endorse history of neuropathy and reports some neuropathic pain tinea pedis does appear improved from previous.  Objective:  Physical Exam: warm, good capillary refill, dry xerotic pedal skin with flaking rash with some peeling present. nail exam onychomycosis of the toenails, onycholysis, and dystrophic nails DP pulses palpable, PT pulses palpable, and protective sensation absent Left Foot:  Pain with palpation of nails due to elongation and dystrophic growth.  Right Foot: Pain with palpation of nails due to elongation and dystrophic growth.  Generalized decreased pedal range of motion noted.  Assessment:   1. Pain due to onychomycosis of toenails of both feet   2. Polyneuropathy associated with underlying disease (HCC)      Plan:  Patient was evaluated and treated and all questions answered.  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 - Does have absent protective sensation to the toes  #Onychomycosis with pain  -Nails palliatively debrided as below. -Educated on self-care  Procedure: Nail Debridement Rationale: Pain Type of Debridement: manual, sharp debridement. Instrumentation: Nail nipper, rotary burr. Number of Nails: 10  Return in about 3 months (around 04/14/2024) for Routine Foot Care.         Ethan LITTIE Saddler, DPM [facility] / [facility]

## 2024-01-17 DIAGNOSIS — R1084 Generalized abdominal pain: Secondary | ICD-10-CM | POA: Diagnosis not present

## 2024-01-17 DIAGNOSIS — Z6831 Body mass index (BMI) 31.0-31.9, adult: Secondary | ICD-10-CM | POA: Diagnosis not present

## 2024-01-17 DIAGNOSIS — N200 Calculus of kidney: Secondary | ICD-10-CM | POA: Diagnosis not present

## 2024-01-17 DIAGNOSIS — N281 Cyst of kidney, acquired: Secondary | ICD-10-CM | POA: Diagnosis not present

## 2024-01-17 DIAGNOSIS — R109 Unspecified abdominal pain: Secondary | ICD-10-CM | POA: Diagnosis not present

## 2024-01-17 DIAGNOSIS — K5730 Diverticulosis of large intestine without perforation or abscess without bleeding: Secondary | ICD-10-CM | POA: Diagnosis not present

## 2024-01-17 DIAGNOSIS — R300 Dysuria: Secondary | ICD-10-CM | POA: Diagnosis not present

## 2024-01-20 DIAGNOSIS — M549 Dorsalgia, unspecified: Secondary | ICD-10-CM | POA: Diagnosis not present

## 2024-01-20 DIAGNOSIS — Z8781 Personal history of (healed) traumatic fracture: Secondary | ICD-10-CM | POA: Diagnosis not present

## 2024-01-20 DIAGNOSIS — M1732 Unilateral post-traumatic osteoarthritis, left knee: Secondary | ICD-10-CM | POA: Diagnosis not present

## 2024-01-24 DIAGNOSIS — R109 Unspecified abdominal pain: Secondary | ICD-10-CM | POA: Diagnosis not present

## 2024-01-24 DIAGNOSIS — Z6831 Body mass index (BMI) 31.0-31.9, adult: Secondary | ICD-10-CM | POA: Diagnosis not present

## 2024-01-24 DIAGNOSIS — S22080A Wedge compression fracture of T11-T12 vertebra, initial encounter for closed fracture: Secondary | ICD-10-CM | POA: Diagnosis not present

## 2024-01-27 DIAGNOSIS — R222 Localized swelling, mass and lump, trunk: Secondary | ICD-10-CM | POA: Diagnosis not present

## 2024-01-27 DIAGNOSIS — M545 Low back pain, unspecified: Secondary | ICD-10-CM | POA: Diagnosis not present

## 2024-01-27 DIAGNOSIS — M25552 Pain in left hip: Secondary | ICD-10-CM | POA: Diagnosis not present

## 2024-01-27 DIAGNOSIS — M1732 Unilateral post-traumatic osteoarthritis, left knee: Secondary | ICD-10-CM | POA: Diagnosis not present

## 2024-01-27 DIAGNOSIS — G8929 Other chronic pain: Secondary | ICD-10-CM | POA: Diagnosis not present

## 2024-02-03 DIAGNOSIS — M1732 Unilateral post-traumatic osteoarthritis, left knee: Secondary | ICD-10-CM | POA: Diagnosis not present

## 2024-02-06 DIAGNOSIS — Z7901 Long term (current) use of anticoagulants: Secondary | ICD-10-CM | POA: Diagnosis not present

## 2024-02-07 DIAGNOSIS — R222 Localized swelling, mass and lump, trunk: Secondary | ICD-10-CM | POA: Diagnosis not present

## 2024-02-08 DIAGNOSIS — G7001 Myasthenia gravis with (acute) exacerbation: Secondary | ICD-10-CM | POA: Diagnosis not present

## 2024-02-09 DIAGNOSIS — G7001 Myasthenia gravis with (acute) exacerbation: Secondary | ICD-10-CM | POA: Diagnosis not present

## 2024-02-10 IMAGING — MR MR HEAD W/O CM
13 of 14 series · 38 of 48 positions shown · non-contrast
Comparison: 09/10/2016

CLINICAL DATA: Seizure, generalized, with abnormal neuro exam.
Seropositive myasthenia gravis with exacerbation

EXAM:
MRI HEAD WITHOUT CONTRAST
TECHNIQUE: Multiplanar, multiecho pulse sequences of the brain and surrounding
structures were obtained without intravenous contrast.

[Series 5: DWI · axial · 3.0mm · 0.77mm/px · z∈[-98,+39]mm · 5 of 50 slices shown (1 of 6)]
[im 1/50]
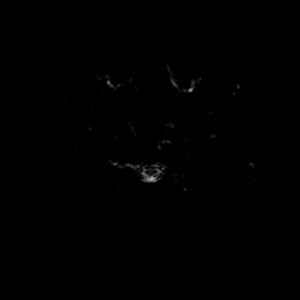
[im 13/50]
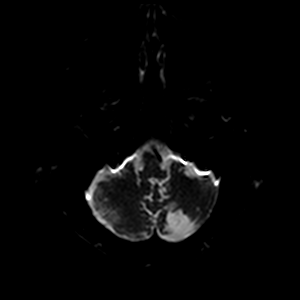
[im 25/50]
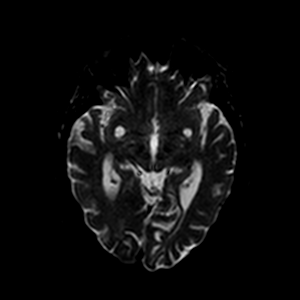
[im 37/50]
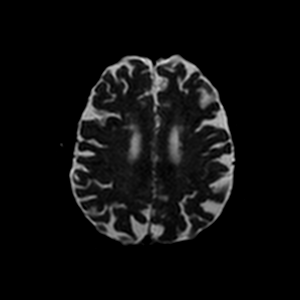
[im 50/50]
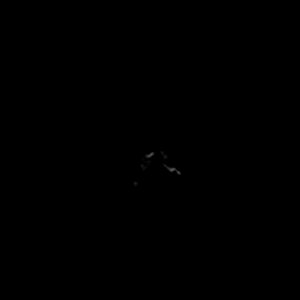

[Series 5: DWI · axial · 3.0mm · 0.77mm/px · z∈[-98,+39]mm · 4 of 50 slices shown (2 of 6)]
[im 1/50]
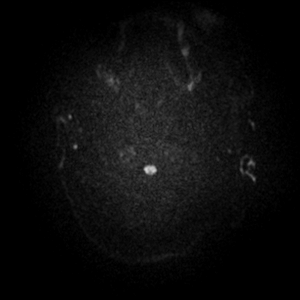
[im 17/50]
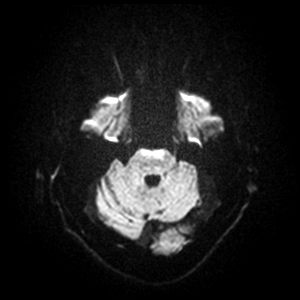
[im 33/50]
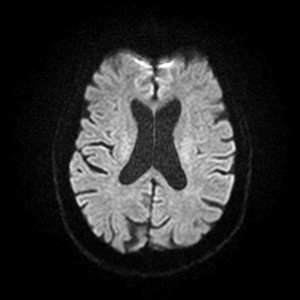
[im 50/50]
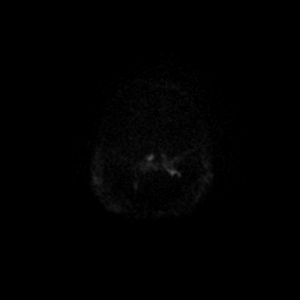

[Series 6: DWI · axial · 3.0mm · 0.77mm/px · z∈[-98,+39]mm · 4 of 50 slices shown (3 of 6)]
[im 1/50]
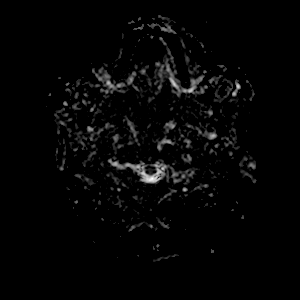
[im 17/50]
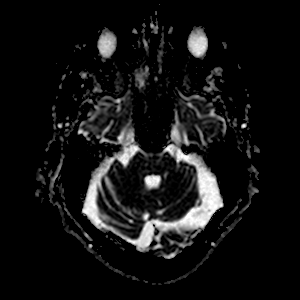
[im 33/50]
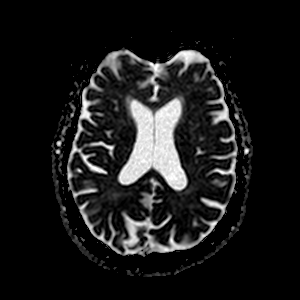
[im 50/50]
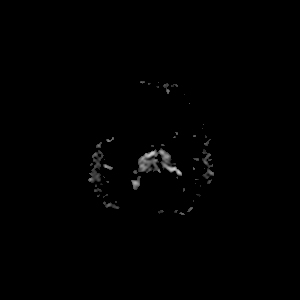

[Series 7: DWI · coronal · 5.0mm · 0.88mm/px · 2 of 28 slices shown (4 of 6)]
[im 1/28]
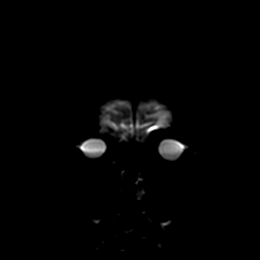
[im 28/28]
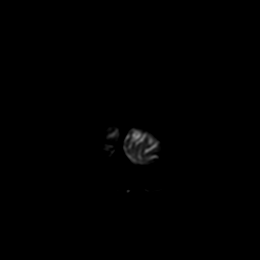

[Series 7: DWI · coronal · 5.0mm · 0.88mm/px · 2 of 28 slices shown (5 of 6)]
[im 1/28]
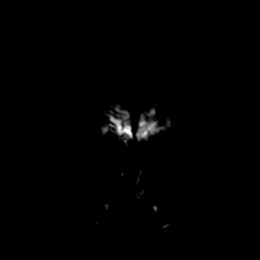
[im 28/28]
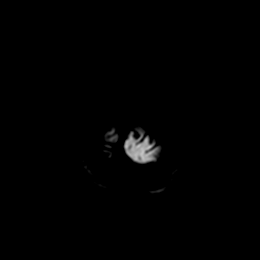

[Series 8: DWI · coronal · 5.0mm · 0.88mm/px · 2 of 28 slices shown (6 of 6)]
[im 1/28]
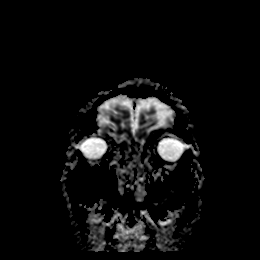
[im 28/28]
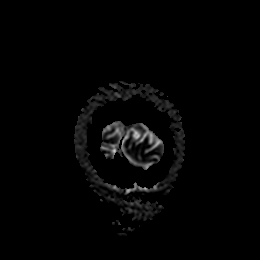

[Series 9: T1 · sagittal · 5.0mm · 0.75mm/px · 1 of 19 slices shown]
[im 1/19]
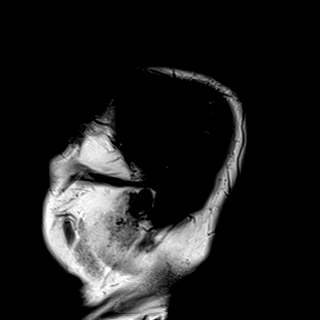

[Series 10: T2 · axial · 5.0mm · 0.72mm/px · 1 of 20 slices shown (1 of 2)]
[im 1/20]
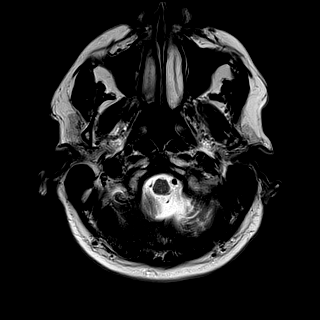

[Series 11: mag_images · axial · 3.0mm · 0.90mm/px · z∈[-103,+63]mm · 4 of 60 slices shown]
[im 1/60]
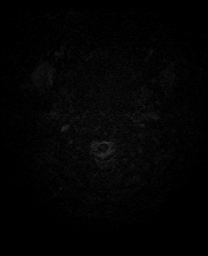
[im 20/60]
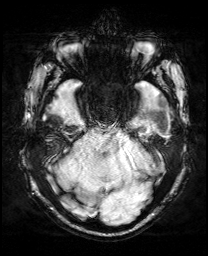
[im 40/60]
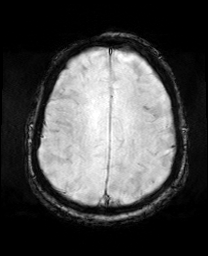
[im 60/60]
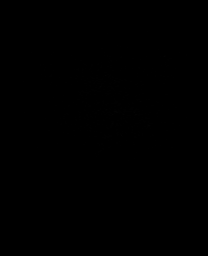

[Series 12: pha_images · axial · 3.0mm · 0.90mm/px · z∈[-100,+49]mm · 4 of 54 slices shown]
[im 1/54]
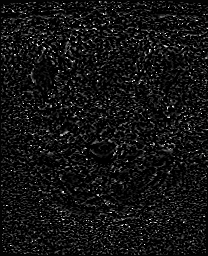
[im 18/54]
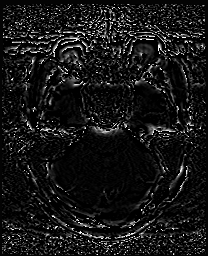
[im 36/54]
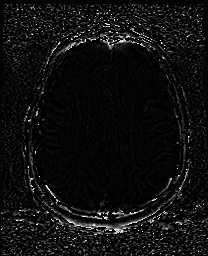
[im 54/54]
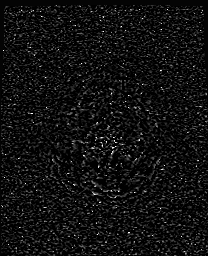

[Series 13: swi_images · axial · 3.0mm · 0.90mm/px · z∈[-103,+63]mm · 4 of 60 slices shown]
[im 1/60]
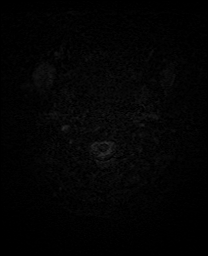
[im 20/60]
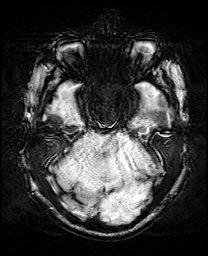
[im 40/60]
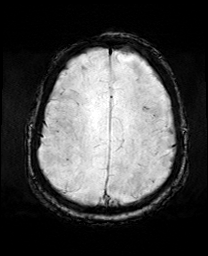
[im 60/60]
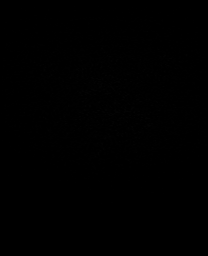

[Series 15: FLAIR · axial · 3.0mm · 0.45mm/px · z∈[-83,+43]mm · 3 of 46 slices shown]
[im 1/46]
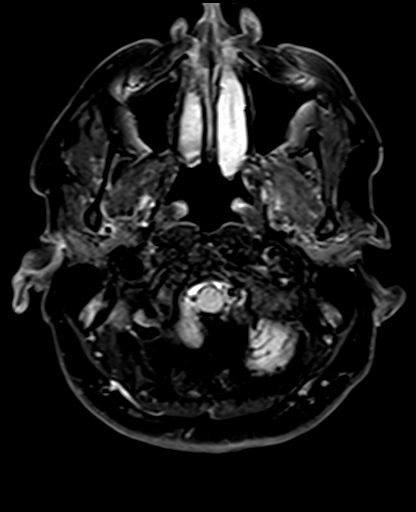
[im 23/46]
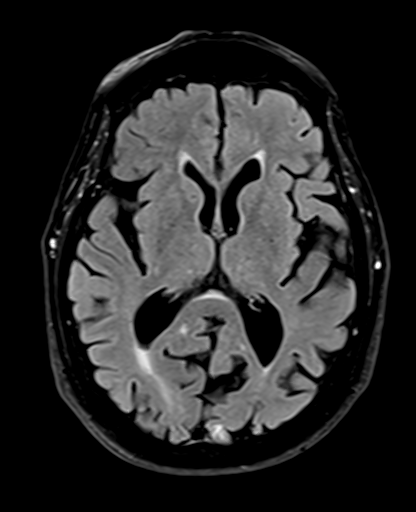
[im 46/46]
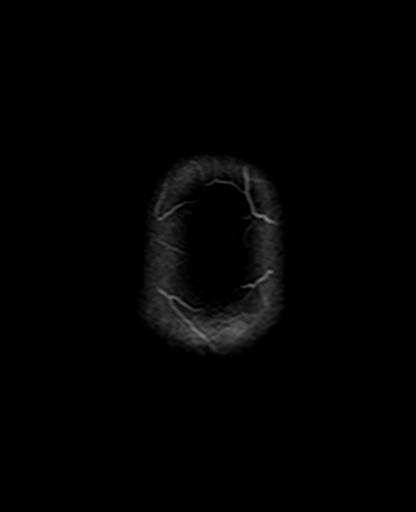

[Series 17: T2 · coronal · 5.0mm · 0.72mm/px · 2 of 28 slices shown (2 of 2)]
[im 1/28]
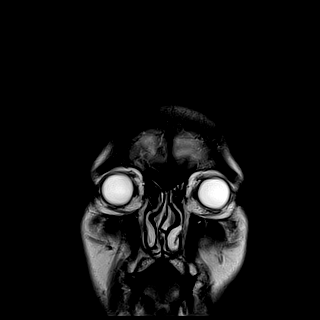
[im 28/28]
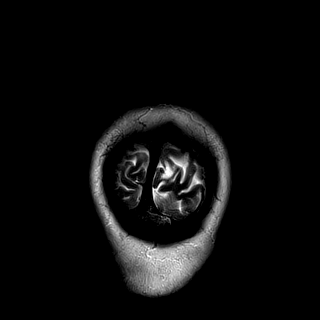

[38 of 48 positions shown; findings below may reference images not displayed]

FINDINGS: Brain: No acute infarction, hemorrhage, hydrocephalus, extra-axial
collection or mass lesion.

Small remote left occipital, right parietal, and anterior right
frontal cortex infarcts. Moderate area of remote infarction in the
lower left cerebellum. Chronic lacunar infarct in the right corona
radiata. No acute or subacute infarct, hemorrhage, hydrocephalus,
collection, or masslike finding.

Generalized cerebral volume loss.

Vascular: Major flow voids are preserved

Skull and upper cervical spine: Normal marrow signal

Sinuses/Orbits: Bilateral cataract resection.  No acute finding
IMPRESSION: 1. No acute or subacute insult.  No reversible finding.
2. Scattered chronic cerebral cortex infarcts. Moderate chronic left
cerebellar infarct.

## 2024-02-13 DIAGNOSIS — S39011D Strain of muscle, fascia and tendon of abdomen, subsequent encounter: Secondary | ICD-10-CM | POA: Diagnosis not present

## 2024-02-29 DIAGNOSIS — N189 Chronic kidney disease, unspecified: Secondary | ICD-10-CM | POA: Diagnosis not present

## 2024-02-29 DIAGNOSIS — D649 Anemia, unspecified: Secondary | ICD-10-CM | POA: Diagnosis not present

## 2024-02-29 DIAGNOSIS — E875 Hyperkalemia: Secondary | ICD-10-CM | POA: Diagnosis not present

## 2024-02-29 DIAGNOSIS — I10 Essential (primary) hypertension: Secondary | ICD-10-CM | POA: Diagnosis not present

## 2024-02-29 DIAGNOSIS — Z7901 Long term (current) use of anticoagulants: Secondary | ICD-10-CM | POA: Diagnosis not present

## 2024-03-02 DIAGNOSIS — M1732 Unilateral post-traumatic osteoarthritis, left knee: Secondary | ICD-10-CM | POA: Diagnosis not present

## 2024-03-05 DIAGNOSIS — E559 Vitamin D deficiency, unspecified: Secondary | ICD-10-CM | POA: Diagnosis not present

## 2024-03-05 DIAGNOSIS — R7303 Prediabetes: Secondary | ICD-10-CM | POA: Diagnosis not present

## 2024-03-05 DIAGNOSIS — N189 Chronic kidney disease, unspecified: Secondary | ICD-10-CM | POA: Diagnosis not present

## 2024-03-05 DIAGNOSIS — R6889 Other general symptoms and signs: Secondary | ICD-10-CM | POA: Diagnosis not present

## 2024-03-05 DIAGNOSIS — G7000 Myasthenia gravis without (acute) exacerbation: Secondary | ICD-10-CM | POA: Diagnosis not present

## 2024-03-05 DIAGNOSIS — R55 Syncope and collapse: Secondary | ICD-10-CM | POA: Diagnosis not present

## 2024-03-05 DIAGNOSIS — R109 Unspecified abdominal pain: Secondary | ICD-10-CM | POA: Diagnosis not present

## 2024-03-05 DIAGNOSIS — S22080A Wedge compression fracture of T11-T12 vertebra, initial encounter for closed fracture: Secondary | ICD-10-CM | POA: Diagnosis not present

## 2024-03-05 DIAGNOSIS — Z6831 Body mass index (BMI) 31.0-31.9, adult: Secondary | ICD-10-CM | POA: Diagnosis not present

## 2024-03-05 DIAGNOSIS — I10 Essential (primary) hypertension: Secondary | ICD-10-CM | POA: Diagnosis not present

## 2024-03-05 DIAGNOSIS — R300 Dysuria: Secondary | ICD-10-CM | POA: Diagnosis not present

## 2024-03-05 DIAGNOSIS — Z8673 Personal history of transient ischemic attack (TIA), and cerebral infarction without residual deficits: Secondary | ICD-10-CM | POA: Diagnosis not present

## 2024-03-07 DIAGNOSIS — G7001 Myasthenia gravis with (acute) exacerbation: Secondary | ICD-10-CM | POA: Diagnosis not present

## 2024-03-08 DIAGNOSIS — G7001 Myasthenia gravis with (acute) exacerbation: Secondary | ICD-10-CM | POA: Diagnosis not present

## 2024-03-16 DIAGNOSIS — K219 Gastro-esophageal reflux disease without esophagitis: Secondary | ICD-10-CM | POA: Diagnosis not present

## 2024-03-16 DIAGNOSIS — I4891 Unspecified atrial fibrillation: Secondary | ICD-10-CM | POA: Diagnosis not present

## 2024-03-16 DIAGNOSIS — I10 Essential (primary) hypertension: Secondary | ICD-10-CM | POA: Diagnosis not present

## 2024-03-29 DIAGNOSIS — I4891 Unspecified atrial fibrillation: Secondary | ICD-10-CM | POA: Diagnosis not present

## 2024-03-29 DIAGNOSIS — Z7901 Long term (current) use of anticoagulants: Secondary | ICD-10-CM | POA: Diagnosis not present

## 2024-04-04 DIAGNOSIS — G7001 Myasthenia gravis with (acute) exacerbation: Secondary | ICD-10-CM | POA: Diagnosis not present

## 2024-04-05 DIAGNOSIS — G7001 Myasthenia gravis with (acute) exacerbation: Secondary | ICD-10-CM | POA: Diagnosis not present

## 2024-04-06 DIAGNOSIS — S0083XA Contusion of other part of head, initial encounter: Secondary | ICD-10-CM | POA: Diagnosis not present

## 2024-04-06 DIAGNOSIS — S298XXA Other specified injuries of thorax, initial encounter: Secondary | ICD-10-CM | POA: Diagnosis not present

## 2024-04-06 DIAGNOSIS — Z86711 Personal history of pulmonary embolism: Secondary | ICD-10-CM | POA: Diagnosis not present

## 2024-04-06 DIAGNOSIS — I4891 Unspecified atrial fibrillation: Secondary | ICD-10-CM | POA: Diagnosis not present

## 2024-04-06 DIAGNOSIS — R296 Repeated falls: Secondary | ICD-10-CM | POA: Diagnosis not present

## 2024-04-06 DIAGNOSIS — Z7901 Long term (current) use of anticoagulants: Secondary | ICD-10-CM | POA: Diagnosis not present

## 2024-04-13 DIAGNOSIS — Z7901 Long term (current) use of anticoagulants: Secondary | ICD-10-CM | POA: Diagnosis not present

## 2024-04-13 DIAGNOSIS — Z6831 Body mass index (BMI) 31.0-31.9, adult: Secondary | ICD-10-CM | POA: Diagnosis not present

## 2024-04-20 ENCOUNTER — Ambulatory Visit: Admitting: Podiatry

## 2024-04-20 DIAGNOSIS — Z7901 Long term (current) use of anticoagulants: Secondary | ICD-10-CM | POA: Diagnosis not present

## 2024-04-20 DIAGNOSIS — I4891 Unspecified atrial fibrillation: Secondary | ICD-10-CM | POA: Diagnosis not present

## 2024-04-23 DIAGNOSIS — G7001 Myasthenia gravis with (acute) exacerbation: Secondary | ICD-10-CM | POA: Diagnosis not present

## 2024-04-23 DIAGNOSIS — N1832 Chronic kidney disease, stage 3b: Secondary | ICD-10-CM | POA: Diagnosis not present

## 2024-04-23 DIAGNOSIS — H6122 Impacted cerumen, left ear: Secondary | ICD-10-CM | POA: Diagnosis not present

## 2024-04-23 DIAGNOSIS — I4891 Unspecified atrial fibrillation: Secondary | ICD-10-CM | POA: Diagnosis not present

## 2024-04-23 DIAGNOSIS — Z8673 Personal history of transient ischemic attack (TIA), and cerebral infarction without residual deficits: Secondary | ICD-10-CM | POA: Diagnosis not present

## 2024-04-23 DIAGNOSIS — M79676 Pain in unspecified toe(s): Secondary | ICD-10-CM | POA: Diagnosis not present

## 2024-04-23 DIAGNOSIS — B351 Tinea unguium: Secondary | ICD-10-CM | POA: Diagnosis not present

## 2024-04-23 DIAGNOSIS — I10 Essential (primary) hypertension: Secondary | ICD-10-CM | POA: Diagnosis not present

## 2024-04-23 DIAGNOSIS — E559 Vitamin D deficiency, unspecified: Secondary | ICD-10-CM | POA: Diagnosis not present

## 2024-04-23 DIAGNOSIS — Z86711 Personal history of pulmonary embolism: Secondary | ICD-10-CM | POA: Diagnosis not present

## 2024-04-27 DIAGNOSIS — Z7901 Long term (current) use of anticoagulants: Secondary | ICD-10-CM | POA: Diagnosis not present

## 2024-04-27 DIAGNOSIS — Z86711 Personal history of pulmonary embolism: Secondary | ICD-10-CM | POA: Diagnosis not present

## 2024-05-03 ENCOUNTER — Encounter: Payer: Self-pay | Admitting: Podiatry

## 2024-05-03 ENCOUNTER — Ambulatory Visit (INDEPENDENT_AMBULATORY_CARE_PROVIDER_SITE_OTHER): Admitting: Podiatry

## 2024-05-03 DIAGNOSIS — B351 Tinea unguium: Secondary | ICD-10-CM

## 2024-05-03 DIAGNOSIS — M79675 Pain in left toe(s): Secondary | ICD-10-CM

## 2024-05-03 DIAGNOSIS — G63 Polyneuropathy in diseases classified elsewhere: Secondary | ICD-10-CM

## 2024-05-03 DIAGNOSIS — M79674 Pain in right toe(s): Secondary | ICD-10-CM | POA: Diagnosis not present

## 2024-05-03 DIAGNOSIS — L853 Xerosis cutis: Secondary | ICD-10-CM | POA: Diagnosis not present

## 2024-05-03 NOTE — Progress Notes (Signed)
°  Subjective:  Patient ID: Calvin Sloan, male    DOB: Jan 02, 1934,  MRN: [id number]  Chief Complaint  Patient presents with   RFC    RFC  no calluses. Not diabetic. Warfarin    89 y.o. male presents with the above complaint. History confirmed with patient. Patient presenting with pain related to dystrophic thickened elongated nails. Patient is unable to trim own nails related to nail dystrophy and/or mobility issues. Patient does not have a history of T2DM.  He does endorse history of neuropathy and reports some neuropathic pain tinea pedis does appear improved from previous.  Does have significant dry pedal skin.  Reports that he is continue to use antifungal cream for some time.  Tinea pedis appears resolved.  Objective:  Physical Exam: warm, good capillary refill, dry xerotic pedal skin with flaking rash with some peeling present. nail exam onychomycosis of the toenails, onycholysis, and dystrophic nails DP pulses palpable, PT pulses palpable, and protective sensation absent Left Foot:  Pain with palpation of nails due to elongation and dystrophic growth.  Right Foot: Pain with palpation of nails due to elongation and dystrophic growth.  Generalized decreased pedal range of motion noted.  History of right ankle fusion  Assessment:   1. Pain due to onychomycosis of toenails of both feet   2. Polyneuropathy associated with underlying disease   3. Xerosis cutis      Plan:  Patient was evaluated and treated and all questions answered.  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 - Does have absent protective sensation to the toes  #Onychomycosis with pain  -Nails palliatively debrided as below. -Educated on self-care - Anticoagulated on warfarin  Procedure: Nail Debridement Rationale: Pain Type of Debridement: manual, sharp debridement. Instrumentation: Nail nipper, rotary burr. Number of Nails: 10  # Xerosis cutis - Recommend daily  moisturization of dry pedal skin.  Avoid lying between toes. - May start with an over-the-counter unscented lotion such as Lubriderm or Eucerin. - He may also try Aquaphor or Vaseline. - Advised patient that moisturizing following showering will yield best results. -I certify that this diagnosis represents a distinct and separate diagnosis that requires evaluation and treatment separate from other procedures or diagnosis    Return in about 3 months (around 08/01/2024) for Routine Foot Care.         Ethan LITTIE Saddler, DPM [facility] / [facility]

## 2024-05-30 ENCOUNTER — Telehealth: Payer: Self-pay | Admitting: Neurology

## 2024-05-30 NOTE — Telephone Encounter (Signed)
 Last seen in July 2025 by Lauraine, it is oK to put him schedule with me or Lauraine in 2-4 weeks

## 2024-05-30 NOTE — Telephone Encounter (Signed)
 Phone room please call and get patient scheduled see below

## 2024-05-30 NOTE — Telephone Encounter (Signed)
 Calvin Sloan came in to the office for his infusion today. He was not on the schedule and Luke is off. Bri looked at the notes and advised that Asir was infused 04/04/24 and Dr. Onita stopped in to access stating that they will stop infusions to give his veins a rest, and start back up in March 2026. Patients would like to schedule and appointment or discuss with Dr. Onita. His next appointment isn't until July 27,2026, Please call [name] patients wife she is on the Dpr.

## 2024-05-30 NOTE — Telephone Encounter (Signed)
 Called patient's wife to schedule appointment. Patient unable to come in the afternoon due to do not drive at night. Need appointment before 2 pm.Can call back to this number:[phone]

## 2024-05-31 NOTE — Telephone Encounter (Signed)
 Spoke with pt wife [name] and pt scheduled on 07/03/24 @ 8:45am, check in at 8:30am

## 2024-07-03 ENCOUNTER — Ambulatory Visit: Admitting: Neurology

## 2024-08-02 ENCOUNTER — Ambulatory Visit: Admitting: Podiatry

## 2024-12-10 ENCOUNTER — Ambulatory Visit: Admitting: Neurology
